# Patient Record
Sex: Female | Born: 1970 | Race: Black or African American | Hispanic: Yes | Marital: Married | Smoking: Never smoker
Health system: Southern US, Community
[De-identification: ages and names within clinical notes are randomized; demographics above are authoritative.]

## PROBLEM LIST (undated history)

## (undated) DIAGNOSIS — K219 Gastro-esophageal reflux disease without esophagitis: Secondary | ICD-10-CM

## (undated) DIAGNOSIS — K5909 Other constipation: Secondary | ICD-10-CM

## (undated) DIAGNOSIS — E079 Disorder of thyroid, unspecified: Secondary | ICD-10-CM

## (undated) DIAGNOSIS — I10 Essential (primary) hypertension: Secondary | ICD-10-CM

## (undated) HISTORY — PX: ABDOMINAL HYSTERECTOMY: SHX81

## (undated) HISTORY — DX: Essential (primary) hypertension: I10

## (undated) HISTORY — DX: Other constipation: K59.09

## (undated) HISTORY — DX: Gastro-esophageal reflux disease without esophagitis: K21.9

## (undated) HISTORY — DX: Disorder of thyroid, unspecified: E07.9

---

## 2011-02-08 ENCOUNTER — Other Ambulatory Visit: Payer: Self-pay | Admitting: Family Medicine

## 2011-02-08 DIAGNOSIS — Z1231 Encounter for screening mammogram for malignant neoplasm of breast: Secondary | ICD-10-CM

## 2011-02-28 ENCOUNTER — Ambulatory Visit
Admission: RE | Admit: 2011-02-28 | Discharge: 2011-02-28 | Disposition: A | Discharge status: Home / Self Care | Payer: Commercial Indemnity | Source: Ambulatory Visit | Attending: Family Medicine | Admitting: Family Medicine

## 2011-02-28 DIAGNOSIS — Z1231 Encounter for screening mammogram for malignant neoplasm of breast: Secondary | ICD-10-CM

## 2011-03-07 ENCOUNTER — Other Ambulatory Visit: Payer: Self-pay | Admitting: Family Medicine

## 2011-03-07 DIAGNOSIS — R928 Other abnormal and inconclusive findings on diagnostic imaging of breast: Secondary | ICD-10-CM

## 2011-03-16 ENCOUNTER — Ambulatory Visit
Admission: RE | Admit: 2011-03-16 | Discharge: 2011-03-16 | Disposition: A | Discharge status: Home / Self Care | Payer: Commercial Indemnity | Source: Ambulatory Visit | Attending: Family Medicine | Admitting: Family Medicine

## 2011-03-16 DIAGNOSIS — R928 Other abnormal and inconclusive findings on diagnostic imaging of breast: Secondary | ICD-10-CM

## 2012-03-07 ENCOUNTER — Other Ambulatory Visit (HOSPITAL_COMMUNITY): Payer: Self-pay | Admitting: Family Medicine

## 2012-03-07 DIAGNOSIS — R946 Abnormal results of thyroid function studies: Secondary | ICD-10-CM

## 2012-03-10 ENCOUNTER — Other Ambulatory Visit: Payer: Self-pay | Admitting: Family Medicine

## 2012-03-10 DIAGNOSIS — Z1231 Encounter for screening mammogram for malignant neoplasm of breast: Secondary | ICD-10-CM

## 2012-03-17 ENCOUNTER — Ambulatory Visit (HOSPITAL_COMMUNITY)
Admission: RE | Admit: 2012-03-17 | Discharge: 2012-03-17 | Disposition: A | Discharge status: Home / Self Care | Payer: BC Managed Care – PPO | Source: Ambulatory Visit | Attending: Family Medicine | Admitting: Family Medicine

## 2012-03-17 DIAGNOSIS — R946 Abnormal results of thyroid function studies: Secondary | ICD-10-CM | POA: Insufficient documentation

## 2012-03-18 ENCOUNTER — Other Ambulatory Visit (HOSPITAL_COMMUNITY): Payer: Self-pay | Admitting: Family Medicine

## 2012-03-18 ENCOUNTER — Encounter (HOSPITAL_COMMUNITY)
Admission: RE | Admit: 2012-03-18 | Discharge: 2012-03-18 | Disposition: A | Discharge status: Home / Self Care | Payer: BC Managed Care – PPO | Source: Ambulatory Visit | Attending: Family Medicine | Admitting: Family Medicine

## 2012-03-18 DIAGNOSIS — R946 Abnormal results of thyroid function studies: Secondary | ICD-10-CM

## 2012-03-18 MED ORDER — SODIUM PERTECHNETATE TC 99M INJECTION
10.0000 | Freq: Once | INTRAVENOUS | Status: AC | PRN
  Administered 2012-03-18: 10 via INTRAVENOUS

## 2012-03-18 MED ORDER — SODIUM IODIDE I 131 CAPSULE
12.5000 | Freq: Once | INTRAVENOUS | Status: AC | PRN
  Administered 2012-03-17: 12.5 via ORAL

## 2012-04-04 ENCOUNTER — Other Ambulatory Visit: Payer: Self-pay | Admitting: Endocrinology

## 2012-04-04 ENCOUNTER — Ambulatory Visit
Admission: RE | Admit: 2012-04-04 | Discharge: 2012-04-04 | Disposition: A | Discharge status: Home / Self Care | Payer: BC Managed Care – PPO | Source: Ambulatory Visit | Attending: Family Medicine | Admitting: Family Medicine

## 2012-04-04 DIAGNOSIS — E0590 Thyrotoxicosis, unspecified without thyrotoxic crisis or storm: Secondary | ICD-10-CM

## 2012-04-04 DIAGNOSIS — Z1231 Encounter for screening mammogram for malignant neoplasm of breast: Secondary | ICD-10-CM

## 2012-05-02 ENCOUNTER — Encounter (HOSPITAL_COMMUNITY)
Admission: RE | Admit: 2012-05-02 | Discharge: 2012-05-02 | Disposition: A | Discharge status: Home / Self Care | Payer: BC Managed Care – PPO | Source: Ambulatory Visit | Attending: Endocrinology | Admitting: Endocrinology

## 2012-05-02 DIAGNOSIS — E0590 Thyrotoxicosis, unspecified without thyrotoxic crisis or storm: Secondary | ICD-10-CM | POA: Insufficient documentation

## 2012-05-02 MED ORDER — SODIUM IODIDE I 131 CAPSULE
16.7500 | Freq: Once | INTRAVENOUS | Status: AC | PRN
  Administered 2012-05-02: 16.75 via ORAL

## 2013-01-17 ENCOUNTER — Ambulatory Visit (INDEPENDENT_AMBULATORY_CARE_PROVIDER_SITE_OTHER): Payer: BC Managed Care – PPO | Admitting: Family Medicine

## 2013-01-17 VITALS — BP 147/97 | HR 59 | Temp 98.0°F | Resp 16 | Ht 66.5 in | Wt 230.0 lb

## 2013-01-17 DIAGNOSIS — R42 Dizziness and giddiness: Secondary | ICD-10-CM

## 2013-01-17 DIAGNOSIS — D649 Anemia, unspecified: Secondary | ICD-10-CM

## 2013-01-17 DIAGNOSIS — I10 Essential (primary) hypertension: Secondary | ICD-10-CM

## 2013-01-17 DIAGNOSIS — E079 Disorder of thyroid, unspecified: Secondary | ICD-10-CM

## 2013-01-17 LAB — POCT URINALYSIS DIPSTICK
Bilirubin, UA: NEGATIVE
Blood, UA: NEGATIVE
Glucose, UA: NEGATIVE
Ketones, UA: NEGATIVE
Leukocytes, UA: NEGATIVE
Nitrite, UA: NEGATIVE
Protein, UA: NEGATIVE
Spec Grav, UA: 1.02
Urobilinogen, UA: 1
pH, UA: 6.5

## 2013-01-17 LAB — POCT CBC
Granulocyte percent: 54.2 % (ref 37–80)
HCT, POC: 37.8 % (ref 37.7–47.9)
Hemoglobin: 11.7 g/dL — AB (ref 12.2–16.2)
Lymph, poc: 1.9 (ref 0.6–3.4)
MCH, POC: 28.9 pg (ref 27–31.2)
MCHC: 31 g/dL — AB (ref 31.8–35.4)
MCV: 93.4 fL (ref 80–97)
MID (cbc): 0.3 (ref 0–0.9)
MPV: 9.5 fL (ref 0–99.8)
POC Granulocyte: 2.7 (ref 2–6.9)
POC LYMPH PERCENT: 38.9 % (ref 10–50)
POC MID %: 6.9 % (ref 0–12)
Platelet Count, POC: 252 10*3/uL (ref 142–424)
RBC: 4.05 M/uL (ref 4.04–5.48)
RDW, POC: 14.6 %
WBC: 5 10*3/uL (ref 4.6–10.2)

## 2013-01-17 LAB — COMPREHENSIVE METABOLIC PANEL WITH GFR
ALT: 8 U/L (ref 0–35)
AST: 17 U/L (ref 0–37)
Albumin: 4.5 g/dL (ref 3.5–5.2)
Alkaline Phosphatase: 86 U/L (ref 39–117)
Potassium: 4.3 meq/L (ref 3.5–5.3)
Sodium: 139 meq/L (ref 135–145)
Total Bilirubin: 0.6 mg/dL (ref 0.3–1.2)
Total Protein: 7.7 g/dL (ref 6.0–8.3)

## 2013-01-17 LAB — COMPREHENSIVE METABOLIC PANEL
BUN: 13 mg/dL (ref 6–23)
CO2: 24 mEq/L (ref 19–32)
Calcium: 9.4 mg/dL (ref 8.4–10.5)
Chloride: 104 mEq/L (ref 96–112)
Creat: 1.34 mg/dL — ABNORMAL HIGH (ref 0.50–1.10)
Glucose, Bld: 98 mg/dL (ref 70–99)

## 2013-01-17 LAB — POCT UA - MICROSCOPIC ONLY
Casts, Ur, LPF, POC: NEGATIVE
Crystals, Ur, HPF, POC: NEGATIVE
Mucus, UA: POSITIVE
Yeast, UA: NEGATIVE

## 2013-01-17 LAB — TSH: TSH: 71.724 u[IU]/mL — ABNORMAL HIGH (ref 0.350–4.500)

## 2013-01-17 MED ORDER — CHLORTHALIDONE 25 MG PO TABS: 25.0000 mg | ORAL_TABLET | Freq: Every day | ORAL | Status: DC

## 2013-01-17 NOTE — Progress Notes (Signed)
 [facility]:  Office Visit  Chief Complaint:  Chief Complaint  Patient presents with  . Hypertension    158/107 yesterday    HPI: Danielle Khan is a 42 y.o. female who complains of  HTN x 10 years, but yesterday she had a dizzy spell when she was trying to get up from a sitting to standing position,  and had seen the nurse at work and had BP of 158/107 Was on fluid pill but was taken off because she had low potassium, she was doing ok with BP when she was on the  Hctz 25 mg  She also has hyperthyroid disease s/p radiation. Followed by Dr. Lucianne Muss. Per patient she was told that after her "radiation pill" she was fine and did not need follow-up.   PCP: Milus Height ( last saw her 1 year ago) Endocrinologist: Reather [facility])  Past Medical History  Diagnosis Date  . Hypertension   . Thyroid disease    Past Surgical History  Procedure Laterality Date  . Abdominal hysterectomy     History   Social History  . Marital Status: Married    Spouse Name: N/A    Number of Children: N/A  . Years of Education: N/A   Social History Main Topics  . Smoking status: Never Smoker   . Smokeless tobacco: None  . Alcohol Use: Yes  . Drug Use: No  . Sexually Active: Yes   Other Topics Concern  . None   Social History Narrative  . None   History reviewed. No pertinent family history. No Known Allergies Prior to Admission medications   Medication Sig Start Date End Date Taking? Authorizing Provider  atenolol (TENORMIN) 50 MG tablet Take 50 mg by mouth daily.   Yes Historical Provider, MD     ROS: The patient denies fevers, chills, night sweats, unintentional weight loss, chest pain, palpitations, wheezing, dyspnea on exertion, nausea, vomiting, abdominal pain, dysuria, hematuria, melena, numbness, weakness, or tingling.   All other systems have been reviewed and were otherwise negative with the exception of those mentioned in the HPI and as above.     PHYSICAL EXAM: Filed Vitals:   01/17/13 1217  BP: 147/97  Pulse: 59  Temp:   Resp:    Filed Vitals:   01/17/13 1131  Height: 5' 6.5" (1.689 m)  Weight: 230 lb (104.327 kg)   Body mass index is 36.57 kg/(m^2).  General: Alert, no acute distress HEENT:  Normocephalic, atraumatic, oropharynx patent. + exopthalamos Cardiovascular:  Regular rate and rhythm, no rubs murmurs or gallops.  No Carotid bruits, radial pulse intact. No pedal edema.  Respiratory: Clear to auscultation bilaterally.  No wheezes, rales, or rhonchi.  No cyanosis, no use of accessory musculature GI: No organomegaly, abdomen is soft and non-tender, positive bowel sounds.  No masses. Skin: No rashes. Neurologic: Facial musculature symmetric. Psychiatric: Patient is appropriate throughout our interaction. Lymphatic: No cervical lymphadenopathy Musculoskeletal: Gait intact.   LABS: Results for orders placed in visit on 01/17/13  POCT CBC      Result Value Range   WBC 5.0  4.6 - 10.2 K/uL   Lymph, poc 1.9  0.6 - 3.4   POC LYMPH PERCENT 38.9  10 - 50 %L   MID (cbc) 0.3  0 - 0.9   POC MID % 6.9  0 - 12 %M   POC Granulocyte 2.7  2 - 6.9   Granulocyte percent 54.2  37 - 80 %G  RBC 4.05  4.04 - 5.48 M/uL   Hemoglobin 11.7 (*) 12.2 - 16.2 g/dL   HCT, POC 81.1  91.4 - 47.9 %   MCV 93.4  80 - 97 fL   MCH, POC 28.9  27 - 31.2 pg   MCHC 31.0 (*) 31.8 - 35.4 g/dL   RDW, POC 78.2     Platelet Count, POC 252  142 - 424 K/uL   MPV 9.5  0 - 99.8 fL  POCT UA - MICROSCOPIC ONLY      Result Value Range   WBC, Ur, HPF, POC 7-8     RBC, urine, microscopic 1-2     Bacteria, U Microscopic 2+     Mucus, UA pos     Epithelial cells, urine per micros 5-6     Crystals, Ur, HPF, POC neg     Casts, Ur, LPF, POC neg     Yeast, UA neg    POCT URINALYSIS DIPSTICK      Result Value Range   Color, UA yellow     Clarity, UA cloudy     Glucose, UA negative     Bilirubin, UA negative     Ketones, UA negative     Spec Grav,  UA 1.020     Blood, UA negative     pH, UA 6.5     Protein, UA negative     Urobilinogen, UA 1.0     Nitrite, UA negative     Leukocytes, UA Negative       EKG/XRAY:   Primary read interpreted by Dr. Conley Rolls at [facility].   ASSESSMENT/PLAN: Encounter Diagnoses  Name Primary?  . HTN (hypertension) Yes  . Dizziness   . Thyroid disease    Although rthostatics normal, most likely positional  She would like to go back to work if BP is better controlled. I will see her tomorrow after HCTZ has been added on She wil return for annual to get IFOBT Labs pending: CMP and also TSH I have advised her to see Dr. Lucianne Muss for thyroid f/u F/u in 1 day for HTN  ,  PHUONG, DO 01/17/2013 12:52 PM

## 2013-01-18 ENCOUNTER — Ambulatory Visit (INDEPENDENT_AMBULATORY_CARE_PROVIDER_SITE_OTHER): Payer: BC Managed Care – PPO | Admitting: Family Medicine

## 2013-01-18 VITALS — BP 120/88 | HR 61 | Temp 97.9°F | Resp 16 | Ht 66.25 in | Wt 229.0 lb

## 2013-01-18 DIAGNOSIS — I10 Essential (primary) hypertension: Secondary | ICD-10-CM

## 2013-01-18 DIAGNOSIS — D649 Anemia, unspecified: Secondary | ICD-10-CM

## 2013-01-18 DIAGNOSIS — E039 Hypothyroidism, unspecified: Secondary | ICD-10-CM

## 2013-01-18 MED ORDER — LEVOTHYROXINE SODIUM 50 MCG PO TABS: 50.0000 ug | ORAL_TABLET | Freq: Every day | ORAL | Status: DC

## 2013-01-18 NOTE — Progress Notes (Signed)
[facility]:  Office Visit  Chief Complaint:  Chief Complaint  Patient presents with  . Hypertension    follow up    HPI: Danielle Khan is a 42 y.o. female who complains of  Her for BP recheck. She is doing better, she is urinating a little more but denies CP, dizziness, HAs.  She has had hyperthyroid s/p radiation and now her labs show that she is hypothyroid. Dr. Maeola Sarah is her endocrinologist  Please see HPI from yesterday Danielle Khan is a 42 y.o. female who complains of HTN x 10 years, but yesterday she had a dizzy spell when she was trying to get up and had seen the nurse at work and had BP of 158/107  Was on fluid pill but was taken off because she had low potassium, she was doing ok with Hctz 25 mg.  She also has hyperthyroid disease s/p radiation a few months back PCP: Altamease Oiler Redmon ( last saw her 1 year ago)  Endocrinologist: Reather [facility])    Past Medical History  Diagnosis Date  . Hypertension   . Thyroid disease    Past Surgical History  Procedure Laterality Date  . Abdominal hysterectomy     History   Social History  . Marital Status: Married    Spouse Name: N/A    Number of Children: N/A  . Years of Education: N/A   Social History Main Topics  . Smoking status: Never Smoker   . Smokeless tobacco: None  . Alcohol Use: Yes  . Drug Use: No  . Sexually Active: Yes   Other Topics Concern  . None   Social History Narrative  . None   History reviewed. No pertinent family history. No Known Allergies Prior to Admission medications   Medication Sig Start Date End Date Taking? Authorizing Provider  atenolol (TENORMIN) 50 MG tablet Take 50 mg by mouth daily.   Yes Historical Provider, MD  chlorthalidone (HYGROTON) 25 MG tablet Take 1 tablet (25 mg total) by mouth daily. 01/17/13  Yes Thao P Le, DO     ROS: The patient denies fevers, chills, night sweats, unintentional weight loss, chest pain, palpitations,  wheezing, dyspnea on exertion, nausea, vomiting, abdominal pain, dysuria, hematuria, melena, numbness, weakness, or tingling.   All other systems have been reviewed and were otherwise negative with the exception of those mentioned in the HPI and as above.    PHYSICAL EXAM: Filed Vitals:   01/18/13 1121  BP: 120/88  Pulse: 61  Temp: 97.9 F (36.6 C)  Resp: 16   Filed Vitals:   01/18/13 1121  Height: 5' 6.25" (1.683 m)  Weight: 229 lb (103.874 kg)   Body mass index is 36.67 kg/(m^2).  General: Alert, no acute distress HEENT:  Normocephalic, atraumatic, oropharynx patent. Exopthalamos, no appreciable thyroid megaly Cardiovascular:  Regular rate and rhythm, no rubs murmurs or gallops.  No Carotid bruits, radial pulse intact. No pedal edema.  Respiratory: Clear to auscultation bilaterally.  No wheezes, rales, or rhonchi.  No cyanosis, no use of accessory musculature GI: No organomegaly, abdomen is soft and non-tender, positive bowel sounds.  No masses. Skin: No rashes. Neurologic: Facial musculature symmetric. Psychiatric: Patient is appropriate throughout our interaction. Lymphatic: No cervical lymphadenopathy Musculoskeletal: Gait intact.   LABS: Results for orders placed in visit on 01/17/13  COMPREHENSIVE METABOLIC PANEL      Result Value Range   Sodium 139  135 - 145 mEq/L   Potassium 4.3  3.5 - 5.3 mEq/L   Chloride 104  96 - 112 mEq/L   CO2 24  19 - 32 mEq/L   Glucose, Bld 98  70 - 99 mg/dL   BUN 13  6 - 23 mg/dL   Creat 1.61 (*) 0.96 - 1.10 mg/dL   Total Bilirubin 0.6  0.3 - 1.2 mg/dL   Alkaline Phosphatase 86  39 - 117 U/L   AST 17  0 - 37 U/L   ALT 8  0 - 35 U/L   Total Protein 7.7  6.0 - 8.3 g/dL   Albumin 4.5  3.5 - 5.2 g/dL   Calcium 9.4  8.4 - 04.5 mg/dL  TSH      Result Value Range   TSH 71.724 (*) 0.350 - 4.500 uIU/mL  POCT CBC      Result Value Range   WBC 5.0  4.6 - 10.2 K/uL   Lymph, poc 1.9  0.6 - 3.4   POC LYMPH PERCENT 38.9  10 - 50 %L   MID  (cbc) 0.3  0 - 0.9   POC MID % 6.9  0 - 12 %M   POC Granulocyte 2.7  2 - 6.9   Granulocyte percent 54.2  37 - 80 %G   RBC 4.05  4.04 - 5.48 M/uL   Hemoglobin 11.7 (*) 12.2 - 16.2 g/dL   HCT, POC 40.9  81.1 - 47.9 %   MCV 93.4  80 - 97 fL   MCH, POC 28.9  27 - 31.2 pg   MCHC 31.0 (*) 31.8 - 35.4 g/dL   RDW, POC 91.4     Platelet Count, POC 252  142 - 424 K/uL   MPV 9.5  0 - 99.8 fL  POCT UA - MICROSCOPIC ONLY      Result Value Range   WBC, Ur, HPF, POC 7-8     RBC, urine, microscopic 1-2     Bacteria, U Microscopic 2+     Mucus, UA pos     Epithelial cells, urine per micros 5-6     Crystals, Ur, HPF, POC neg     Casts, Ur, LPF, POC neg     Yeast, UA neg    POCT URINALYSIS DIPSTICK      Result Value Range   Color, UA yellow     Clarity, UA cloudy     Glucose, UA negative     Bilirubin, UA negative     Ketones, UA negative     Spec Grav, UA 1.020     Blood, UA negative     pH, UA 6.5     Protein, UA negative     Urobilinogen, UA 1.0     Nitrite, UA negative     Leukocytes, UA Negative       EKG/XRAY:   Primary read interpreted by Dr. Conley Rolls at [facility].   ASSESSMENT/PLAN: Encounter Diagnoses  Name Primary?  . HTN (hypertension) Yes  . Unspecified hypothyroidism   . Anemia    HTN is improved, she has no SEs-return in 1 month to recheck and get repeat BMP She will take a banana a day nad potassium rich foods ie coconut water Advise to stop chewing tobacco Hypothyroid s/p radiation, she will be started on Levothyroxine 50 mcg and see how she does, she will return to Dr. Lucianne Muss for follow-up in 6 weeks, advise to make appt Instructed the patient on how to take Levothyroxine, by itself with water, no other meds, empty stomach, wait 30  min-1 hr before eating.drinking She will return in 1 month for annual visit we can recheck her HTN, anemia at that time. Declined rectal exam today.  Gross sideeffects, risk and benefits, and alternatives of medications d/w patient. Patient is  aware that all medications have potential sideeffects and we are unable to predict every sideeffect or drug-drug interaction that may occur. I will call DR. Lucianne Muss to see if anything elese needs to be done, and also if LEvothyroxine 50 mcg is appropriate with TSH in the 71 range.  F/u in 1 month   LE, THAO PHUONG, DO 01/18/2013 11:34 AM

## 2013-01-26 ENCOUNTER — Telehealth: Payer: Self-pay | Admitting: Family Medicine

## 2013-01-26 NOTE — Telephone Encounter (Signed)
Spoke wtih patient--informed her I spoke with Dr. Remus Blake nurse about TSH and strating levothyroxine. She will le thim know Advise patient to make appt with him sinc estarting new meds Also return for annual, HTN f/u, anemia work up if still the case in 3-4 weeks

## 2013-02-01 DIAGNOSIS — Z0271 Encounter for disability determination: Secondary | ICD-10-CM

## 2013-02-26 ENCOUNTER — Other Ambulatory Visit: Payer: Self-pay | Admitting: Family Medicine

## 2013-02-26 NOTE — Telephone Encounter (Signed)
Sent refill of Chlorthalidone for one month included note to advise pt to rtc for ov. Tried to reach patient to advise she will need a visit for further refills per last OV. Unable to leave a message.

## 2013-03-24 ENCOUNTER — Other Ambulatory Visit: Payer: Self-pay

## 2013-03-24 DIAGNOSIS — Z1231 Encounter for screening mammogram for malignant neoplasm of breast: Secondary | ICD-10-CM

## 2013-04-08 ENCOUNTER — Ambulatory Visit: Payer: BC Managed Care – PPO

## 2013-05-08 ENCOUNTER — Encounter: Payer: Self-pay | Admitting: Family Medicine

## 2013-05-30 ENCOUNTER — Other Ambulatory Visit: Payer: Self-pay | Admitting: Family Medicine

## 2013-07-07 ENCOUNTER — Ambulatory Visit: Payer: BC Managed Care – PPO

## 2013-08-10 ENCOUNTER — Telehealth: Payer: Self-pay

## 2013-08-10 NOTE — Telephone Encounter (Signed)
PT STATES SHE NEED TO HAVE HER FMLA PAPERS UPDATED. PLEASE CALL [phone] AND WILL BE IN ONE DAY NEXT WEEK TO SEE DR LE

## 2013-08-10 NOTE — Telephone Encounter (Signed)
Spoke with patient, she plans to RTC on 08/14/13 . Would like to know if we can call in a few blood pressure pills until that date. Please advise

## 2013-08-11 MED ORDER — ATENOLOL 50 MG PO TABS: 50.0000 mg | ORAL_TABLET | Freq: Every day | ORAL | Status: DC

## 2013-08-11 NOTE — Telephone Encounter (Signed)
Called pt and verified she has appt w/Dr Conley RollsLe on Friday and that it is the atenolol she needs. Sent in 1 week RF.

## 2013-08-15 ENCOUNTER — Ambulatory Visit (INDEPENDENT_AMBULATORY_CARE_PROVIDER_SITE_OTHER): Payer: BC Managed Care – PPO | Admitting: Family Medicine

## 2013-08-15 VITALS — BP 138/94 | HR 71 | Temp 98.1°F | Resp 16 | Ht 66.5 in | Wt 231.0 lb

## 2013-08-15 DIAGNOSIS — I10 Essential (primary) hypertension: Secondary | ICD-10-CM

## 2013-08-15 DIAGNOSIS — Z91148 Patient's other noncompliance with medication regimen for other reason: Secondary | ICD-10-CM

## 2013-08-15 DIAGNOSIS — Z9114 Patient's other noncompliance with medication regimen: Secondary | ICD-10-CM

## 2013-08-15 DIAGNOSIS — E039 Hypothyroidism, unspecified: Secondary | ICD-10-CM

## 2013-08-15 DIAGNOSIS — D649 Anemia, unspecified: Secondary | ICD-10-CM

## 2013-08-15 DIAGNOSIS — Z0271 Encounter for disability determination: Secondary | ICD-10-CM

## 2013-08-15 LAB — POCT CBC
Granulocyte percent: 54.6 % (ref 37–80)
HCT, POC: 36.8 % — AB (ref 37.7–47.9)
Hemoglobin: 11.2 g/dL — AB (ref 12.2–16.2)
Lymph, poc: 2.2 (ref 0.6–3.4)
MCH, POC: 28.9 pg (ref 27–31.2)
MCHC: 30.4 g/dL — AB (ref 31.8–35.4)
MCV: 95.2 fL (ref 80–97)
MID (cbc): 0.3 (ref 0–0.9)
MPV: 8.2 fL (ref 0–99.8)
POC Granulocyte: 3 (ref 2–6.9)
POC LYMPH PERCENT: 39.8 % (ref 10–50)
POC MID %: 5.6 %M (ref 0–12)
Platelet Count, POC: 268 10*3/uL (ref 142–424)
RBC: 3.87 M/uL — AB (ref 4.04–5.48)
RDW, POC: 14.3 %
WBC: 5.5 10*3/uL (ref 4.6–10.2)

## 2013-08-15 MED ORDER — CHLORTHALIDONE 25 MG PO TABS: 25.0000 mg | ORAL_TABLET | Freq: Every day | ORAL | Status: DC

## 2013-08-15 MED ORDER — ATENOLOL 50 MG PO TABS: 50.0000 mg | ORAL_TABLET | Freq: Every day | ORAL | Status: DC

## 2013-08-15 MED ORDER — LEVOTHYROXINE SODIUM 50 MCG PO TABS: 50.0000 ug | ORAL_TABLET | Freq: Every day | ORAL | Status: DC

## 2013-08-15 NOTE — Progress Notes (Signed)
Chief Complaint:  Chief Complaint  Patient presents with  . Hypertension    med refill  . Hypothyroidism    med refill    HPI: Danielle MccallumCynthia Khan is a 43 y.o. female who is here for : 1. HTN-she is doing well. BP is doing well. She has 7 pills, ran out 4 days but then got 7 pills as emerency meds.  2. Hypothyroidsim s/p radiation-she has not been taking her thyroid meds correctly, she takes about 3 pills a wekk because she forgets and eats ahead of time. She is slightly more moodiness. She has been stress. She works at [employer] [place] , she works second shift. She is not having chest palpitations, insomnia, She has been stressed out due to taking her daughter who had to have her cholecystectomy. Dr Lucianne MussKumar March 2 but she may have to reschedule.   Past Medical History  Diagnosis Date  . Hypertension   . Thyroid disease    Past Surgical History  Procedure Laterality Date  . Abdominal hysterectomy     History   Social History  . Marital Status: Married    Spouse Name: N/A    Number of Children: N/A  . Years of Education: N/A   Social History Main Topics  . Smoking status: Never Smoker   . Smokeless tobacco: None  . Alcohol Use: Yes  . Drug Use: No  . Sexual Activity: Yes   Other Topics Concern  . None   Social History Narrative  . None   No family history on file. No Known Allergies Prior to Admission medications   Medication Sig Start Date End Date Taking? Authorizing Provider  atenolol (TENORMIN) 50 MG tablet Take 1 tablet (50 mg total) by mouth daily. 08/11/13  Yes Siddh Vandeventer P Laurin Morgenstern, DO  levothyroxine (SYNTHROID, LEVOTHROID) 50 MCG tablet Take 1 tablet (50 mcg total) by mouth daily before breakfast. PATIENT NEEDS OFFICE VISIT/LABSFOR ADDITIONAL REFILLS! 05/30/13  Yes Broden Holt P Toren Tucholski, DO  chlorthalidone (HYGROTON) 25 MG tablet Take 1 tablet (25 mg total) by mouth daily. Take 1 tablet daily. Patient needs follow up for further refills. 02/26/13   Heather Jaquita RectorM Marte, PA-C      ROS: The patient denies fevers, chills, night sweats, unintentional weight loss, chest pain, palpitations, wheezing, dyspnea on exertion, nausea, vomiting, abdominal pain, dysuria, hematuria, melena, numbness, weakness, or tingling.   All other systems have been reviewed and were otherwise negative with the exception of those mentioned in the HPI and as above.    PHYSICAL EXAM: Filed Vitals:   08/15/13 1616  BP: 138/94  Pulse: 71  Temp: 98.1 F (36.7 C)  Resp: 16   Filed Vitals:   08/15/13 1616  Height: 5' 6.5" (1.689 m)  Weight: 231 lb (104.781 kg)   Body mass index is 36.73 kg/(m^2).  General: Alert, no acute distress HEENT:  Normocephalic, atraumatic, oropharynx patent. EOMI, PERRLA, fundoscopic exam nl Cardiovascular:  Regular rate and rhythm, no rubs murmurs or gallops.  No Carotid bruits, radial pulse intact. No pedal edema.  Respiratory: Clear to auscultation bilaterally.  No wheezes, rales, or rhonchi.  No cyanosis, no use of accessory musculature GI: No organomegaly, abdomen is soft and non-tender, positive bowel sounds.  No masses. Skin: No rashes. Neurologic: Facial musculature symmetric. Psychiatric: Patient is appropriate throughout our interaction. Lymphatic: No cervical lymphadenopathy Musculoskeletal: Gait intact.   LABS: Results for orders placed in visit on 08/15/13  POCT CBC      Result Value  Ref Range   WBC 5.5  4.6 - 10.2 K/uL   Lymph, poc 2.2  0.6 - 3.4   POC LYMPH PERCENT 39.8  10 - 50 %L   MID (cbc) 0.3  0 - 0.9   POC MID % 5.6  0 - 12 %M   POC Granulocyte 3.0  2 - 6.9   Granulocyte percent 54.6  37 - 80 %G   RBC 3.87 (*) 4.04 - 5.48 M/uL   Hemoglobin 11.2 (*) 12.2 - 16.2 g/dL   HCT, POC 16.1 (*) 09.6 - 47.9 %   MCV 95.2  80 - 97 fL   MCH, POC 28.9  27 - 31.2 pg   MCHC 30.4 (*) 31.8 - 35.4 g/dL   RDW, POC 04.5     Platelet Count, POC 268  142 - 424 K/uL   MPV 8.2  0 - 99.8 fL     EKG/XRAY:   Primary read interpreted by [facility] Conley Rolls at  [facility].   ASSESSMENT/PLAN: Encounter Diagnoses  Name Primary?  . Hypothyroid Yes  . Hypertension   . Anemia    43 year old female who is noncompliant with her medications. She is doing better with blood pressure control but not with thyroid medicine. She takes the thyroid meds about 3 times a week.  She has an appt with her endocrinologist that was rescheduled due to the snow, has not gotten a callback when that will be in March yet. Advise to cont to take BP meds and thryoid meds Labs pending---I am not sure she is going to keep appt so will go ahead and do TSH labs as well as CMP Refilled meds Follow-up in 6 months for BP check F/u with endo for thyroid  F/u at 104 for annual visit and fasting labs, anemia f/u in 1 month  Gross sideeffects, risk and benefits, and alternatives of medications d/w patient. Patient is aware that all medications have potential sideeffects and we are unable to predict every sideeffect or drug-drug interaction that may occur.  Rockne Coons, DO 08/15/2013 5:33 PM

## 2013-08-16 LAB — COMPREHENSIVE METABOLIC PANEL WITH GFR
ALT: 9 U/L (ref 0–35)
AST: 16 U/L (ref 0–37)
Albumin: 4.5 g/dL (ref 3.5–5.2)
Alkaline Phosphatase: 88 U/L (ref 39–117)
BUN: 13 mg/dL (ref 6–23)
Calcium: 8.6 mg/dL (ref 8.4–10.5)
Chloride: 102 meq/L (ref 96–112)
Potassium: 3.8 meq/L (ref 3.5–5.3)
Sodium: 138 meq/L (ref 135–145)
Total Protein: 7.8 g/dL (ref 6.0–8.3)

## 2013-08-16 LAB — TSH: TSH: 79.144 u[IU]/mL — ABNORMAL HIGH (ref 0.350–4.500)

## 2013-08-16 LAB — COMPREHENSIVE METABOLIC PANEL
CO2: 29 mEq/L (ref 19–32)
Creat: 1.18 mg/dL — ABNORMAL HIGH (ref 0.50–1.10)
Glucose, Bld: 85 mg/dL (ref 70–99)
Total Bilirubin: 0.7 mg/dL (ref 0.2–1.2)

## 2013-08-16 LAB — MICROALBUMIN, URINE: Microalb, Ur: 1.56 mg/dL (ref 0.00–1.89)

## 2013-08-17 ENCOUNTER — Ambulatory Visit: Payer: BC Managed Care – PPO | Admitting: Endocrinology

## 2013-08-19 ENCOUNTER — Encounter: Payer: Self-pay | Admitting: Family Medicine

## 2013-08-19 ENCOUNTER — Telehealth: Payer: Self-pay

## 2013-08-19 ENCOUNTER — Telehealth: Payer: Self-pay | Admitting: Family Medicine

## 2013-08-19 DIAGNOSIS — Z91148 Patient's other noncompliance with medication regimen for other reason: Secondary | ICD-10-CM | POA: Insufficient documentation

## 2013-08-19 DIAGNOSIS — E1159 Type 2 diabetes mellitus with other circulatory complications: Secondary | ICD-10-CM | POA: Insufficient documentation

## 2013-08-19 DIAGNOSIS — E890 Postprocedural hypothyroidism: Secondary | ICD-10-CM | POA: Insufficient documentation

## 2013-08-19 DIAGNOSIS — I10 Essential (primary) hypertension: Secondary | ICD-10-CM | POA: Insufficient documentation

## 2013-08-19 DIAGNOSIS — Z9114 Patient's other noncompliance with medication regimen: Secondary | ICD-10-CM | POA: Insufficient documentation

## 2013-08-19 DIAGNOSIS — I152 Hypertension secondary to endocrine disorders: Secondary | ICD-10-CM | POA: Insufficient documentation

## 2013-08-19 NOTE — Telephone Encounter (Signed)
Spoke to patient about labs, since she was only taking her thyroid medicine 3 days out of the week, I encouraged her to take the medicine daily . She is now taking it daily on an empty stomach 30 min before food or other meds. D/w her need for annual visit she can call and make an appt. Needs to be fasting labs. She has endo appt on march 24. D/w patient recent labwork.

## 2013-08-19 NOTE — Telephone Encounter (Signed)
PTS FMLA PPW IS COMPLETED AND READY FOR P/U, LMOM LETTING PT KNOW THIS.

## 2013-09-04 ENCOUNTER — Ambulatory Visit (INDEPENDENT_AMBULATORY_CARE_PROVIDER_SITE_OTHER): Payer: BC Managed Care – PPO | Admitting: Family Medicine

## 2013-09-04 VITALS — BP 118/82 | HR 73 | Temp 98.3°F | Resp 16 | Ht 65.5 in | Wt 224.6 lb

## 2013-09-04 DIAGNOSIS — Z862 Personal history of diseases of the blood and blood-forming organs and certain disorders involving the immune mechanism: Secondary | ICD-10-CM

## 2013-09-04 DIAGNOSIS — K5900 Constipation, unspecified: Secondary | ICD-10-CM

## 2013-09-04 DIAGNOSIS — E039 Hypothyroidism, unspecified: Secondary | ICD-10-CM

## 2013-09-04 DIAGNOSIS — Z8742 Personal history of other diseases of the female genital tract: Secondary | ICD-10-CM

## 2013-09-04 DIAGNOSIS — Z0000 Encounter for general adult medical examination without abnormal findings: Secondary | ICD-10-CM

## 2013-09-04 DIAGNOSIS — I10 Essential (primary) hypertension: Secondary | ICD-10-CM

## 2013-09-04 DIAGNOSIS — Z8639 Personal history of other endocrine, nutritional and metabolic disease: Secondary | ICD-10-CM

## 2013-09-04 LAB — CBC
HCT: 33.7 % — ABNORMAL LOW (ref 36.0–46.0)
Hemoglobin: 11.1 g/dL — ABNORMAL LOW (ref 12.0–15.0)
MCH: 29.4 pg (ref 26.0–34.0)
MCHC: 32.9 g/dL (ref 30.0–36.0)
MCV: 89.2 fL (ref 78.0–100.0)
Platelets: 245 10*3/uL (ref 150–400)
RBC: 3.78 MIL/uL — ABNORMAL LOW (ref 3.87–5.11)
RDW: 15 % (ref 11.5–15.5)
WBC: 5.5 10*3/uL (ref 4.0–10.5)

## 2013-09-04 LAB — COMPREHENSIVE METABOLIC PANEL
ALT: 8 U/L (ref 0–35)
BUN: 12 mg/dL (ref 6–23)
CO2: 30 mEq/L (ref 19–32)
Creat: 1.12 mg/dL — ABNORMAL HIGH (ref 0.50–1.10)
Total Bilirubin: 0.5 mg/dL (ref 0.2–1.2)

## 2013-09-04 LAB — IFOBT (OCCULT BLOOD): IFOBT: POSITIVE

## 2013-09-04 LAB — POCT URINALYSIS DIPSTICK
Bilirubin, UA: NEGATIVE
Blood, UA: NEGATIVE
Glucose, UA: NEGATIVE
Ketones, UA: NEGATIVE
Leukocytes, UA: NEGATIVE
Nitrite, UA: NEGATIVE
Protein, UA: NEGATIVE
Spec Grav, UA: 1.02
Urobilinogen, UA: 1
pH, UA: 6.5

## 2013-09-04 LAB — COMPREHENSIVE METABOLIC PANEL WITH GFR
AST: 16 U/L (ref 0–37)
Albumin: 4.3 g/dL (ref 3.5–5.2)
Alkaline Phosphatase: 103 U/L (ref 39–117)
Calcium: 8.7 mg/dL (ref 8.4–10.5)
Chloride: 101 meq/L (ref 96–112)
Glucose, Bld: 98 mg/dL (ref 70–99)
Potassium: 3.4 meq/L — ABNORMAL LOW (ref 3.5–5.3)
Sodium: 138 meq/L (ref 135–145)
Total Protein: 7.4 g/dL (ref 6.0–8.3)

## 2013-09-04 LAB — POCT UA - MICROSCOPIC ONLY
Casts, Ur, LPF, POC: NEGATIVE
Crystals, Ur, HPF, POC: NEGATIVE
Mucus, UA: NEGATIVE
RBC, urine, microscopic: NEGATIVE
Yeast, UA: NEGATIVE

## 2013-09-04 LAB — LIPID PANEL
Cholesterol: 204 mg/dL — ABNORMAL HIGH (ref 0–200)
HDL: 43 mg/dL (ref >39)
LDL Cholesterol: 133 mg/dL — ABNORMAL HIGH (ref 0–99)
Total CHOL/HDL Ratio: 4.7 ratio
Triglycerides: 142 mg/dL (ref <150)
VLDL: 28 mg/dL (ref 0–40)

## 2013-09-04 NOTE — Telephone Encounter (Signed)
Faxed signed authorization to [facility] for medical records. Dr Ardean LarsenAskary has retired and the office was closed.

## 2013-09-04 NOTE — Progress Notes (Signed)
Chief Complaint: No chief complaint on file.   HPI: Danielle Khan is a 43 y.o. female who is here for  Annual exam Last pap 3 years ago. She had a hysterectomy 3 years ago due to ? Abnormal cells so they went ahead and took it out. She does not remember the exact details.  Mammogram is not , She does not do self breast exams She has some blood on tissue when she strains, she scratches, she also has taken fiber but not enough.  G4L4 She has been taking her meds regular for thyroid and , Doing better overall   Past Medical History  Diagnosis Date  . Hypertension   . Thyroid disease    Past Surgical History  Procedure Laterality Date  . Abdominal hysterectomy     History   Social History  . Marital Status: Married    Spouse Name: N/A    Number of Children: N/A  . Years of Education: N/A   Social History Main Topics  . Smoking status: Never Smoker   . Smokeless tobacco: Not on file  . Alcohol Use: Yes  . Drug Use: No  . Sexual Activity: Yes   Other Topics Concern  . Not on file   Social History Narrative  . No narrative on file   No family history on file. No Known Allergies Prior to Admission medications   Medication Sig Start Date End Date Taking? Authorizing Provider  atenolol (TENORMIN) 50 MG tablet Take 1 tablet (50 mg total) by mouth daily. 08/15/13  Yes Hommer Cunliffe P Martese Vanatta, DO  chlorthalidone (HYGROTON) 25 MG tablet Take 1 tablet (25 mg total) by mouth daily. 08/15/13  Yes Malachy Coleman P Richa Shor, DO  levothyroxine (SYNTHROID, LEVOTHROID) 50 MCG tablet Take 1 tablet (50 mcg total) by mouth daily before breakfast. 08/15/13  Yes Kunio Cummiskey P Aliea Bobe, DO     ROS: The patient denies fevers, chills, night sweats, unintentional weight loss, chest pain, palpitations, wheezing, dyspnea on exertion, nausea, vomiting, abdominal pain, dysuria, hematuria, melena, numbness, weakness, or tingling.   All other systems have been reviewed and were otherwise negative with the exception of those  mentioned in the HPI and as above.    PHYSICAL EXAM: Filed Vitals:   09/04/13 0816  BP: 118/82  Pulse: 73  Temp: 98.3 F (36.8 C)  Resp: 16   Filed Vitals:   09/04/13 0816  Height: 5' 5.5" (1.664 m)  Weight: 224 lb 9.6 oz (101.878 kg)   Body mass index is 36.79 kg/(m^2).  General: Alert, no acute distress HEENT:  Normocephalic, atraumatic, oropharynx patent. EOMI, PERRLA, fundo exam nl, TM nl Cardiovascular:  Regular rate and rhythm, no rubs murmurs or gallops.  No Carotid bruits, radial pulse intact. No pedal edema.  Respiratory: Clear to auscultation bilaterally.  No wheezes, rales, or rhonchi.  No cyanosis, no use of accessory musculature GI: No organomegaly, abdomen is soft and non-tender, positive bowel sounds.  No masses. Skin: No rashes. Neurologic: Facial musculature symmetric. Psychiatric: Patient is appropriate throughout our interaction. Lymphatic: No cervical lymphadenopathy Musculoskeletal: Gait intact. Breast exam normal GU normal, no cervix Rectal exam-+ hemorrhoids, rectal abrasion, no masses  LABS: Results for orders placed in visit on 08/15/13  COMPREHENSIVE METABOLIC PANEL      Result Value Ref Range   Sodium 138  135 - 145 mEq/L   Potassium 3.8  3.5 - 5.3 mEq/L   Chloride 102  96 - 112 mEq/L   CO2 29  19 -  32 mEq/L   Glucose, Bld 85  70 - 99 mg/dL   BUN 13  6 - 23 mg/dL   Creat 1.61 (*) 0.96 - 1.10 mg/dL   Total Bilirubin 0.7  0.2 - 1.2 mg/dL   Alkaline Phosphatase 88  39 - 117 U/L   AST 16  0 - 37 U/L   ALT 9  0 - 35 U/L   Total Protein 7.8  6.0 - 8.3 g/dL   Albumin 4.5  3.5 - 5.2 g/dL   Calcium 8.6  8.4 - 04.5 mg/dL  TSH      Result Value Ref Range   TSH 79.144 (*) 0.350 - 4.500 uIU/mL  MICROALBUMIN, URINE      Result Value Ref Range   Microalb, Ur 1.56  0.00 - 1.89 mg/dL  POCT CBC      Result Value Ref Range   WBC 5.5  4.6 - 10.2 K/uL   Lymph, poc 2.2  0.6 - 3.4   POC LYMPH PERCENT 39.8  10 - 50 %L   MID (cbc) 0.3  0 - 0.9   POC MID  % 5.6  0 - 12 %M   POC Granulocyte 3.0  2 - 6.9   Granulocyte percent 54.6  37 - 80 %G   RBC 3.87 (*) 4.04 - 5.48 M/uL   Hemoglobin 11.2 (*) 12.2 - 16.2 g/dL   HCT, POC 40.9 (*) 81.1 - 47.9 %   MCV 95.2  80 - 97 fL   MCH, POC 28.9  27 - 31.2 pg   MCHC 30.4 (*) 31.8 - 35.4 g/dL   RDW, POC 91.4     Platelet Count, POC 268  142 - 424 K/uL   MPV 8.2  0 - 99.8 fL     EKG/XRAY:   Primary read interpreted by Dr. Conley Rolls at [facility].   ASSESSMENT/PLAN: Encounter Diagnoses  Name Primary?  . Annual physical exam Yes  . HTN (hypertension)   . Unspecified hypothyroidism   . History of abnormal cervical Pap smear   . Unspecified constipation    Needs mammogram Labs pending F/u in 3 months for HTN and h/o medication noncompliance Keep appt with Dr Lucianne Muss for endocrinology I have asked her to sign a release to see path result for abd hystrectomy  Gross sideeffects, risk and benefits, and alternatives of medications d/w patient. Patient is aware that all medications have potential sideeffects and we are unable to predict every sideeffect or drug-drug interaction that may occur.  Twala Collings PHUONG, DO 09/04/2013 9:14 AM

## 2013-09-06 ENCOUNTER — Other Ambulatory Visit: Payer: Self-pay | Admitting: Family Medicine

## 2013-09-06 DIAGNOSIS — Z1239 Encounter for other screening for malignant neoplasm of breast: Secondary | ICD-10-CM

## 2013-09-08 ENCOUNTER — Ambulatory Visit: Payer: BC Managed Care – PPO | Admitting: Endocrinology

## 2013-09-08 LAB — PAP IG W/ RFLX HPV ASCU

## 2013-09-11 ENCOUNTER — Ambulatory Visit (INDEPENDENT_AMBULATORY_CARE_PROVIDER_SITE_OTHER): Payer: BC Managed Care – PPO | Admitting: Endocrinology

## 2013-09-11 ENCOUNTER — Encounter: Payer: Self-pay | Admitting: Endocrinology

## 2013-09-11 VITALS — BP 118/78 | HR 68 | Temp 98.1°F | Resp 16 | Ht 66.5 in | Wt 226.8 lb

## 2013-09-11 DIAGNOSIS — E039 Hypothyroidism, unspecified: Secondary | ICD-10-CM

## 2013-09-11 MED ORDER — LEVOTHYROXINE SODIUM 150 MCG PO TABS: 150.0000 ug | ORAL_TABLET | Freq: Every day | ORAL | Status: DC

## 2013-09-11 NOTE — Patient Instructions (Signed)
Need total of 150ug (3 of the 50s)

## 2013-09-11 NOTE — Progress Notes (Addendum)
Patient ID: Danielle Khan, female   DOB: Mar 06, 1971, 43 y.o.   MRN: [id number]   Reason for Appointment:  Hypothyroidism, followup visit    History of Present Illness:   The patient was seen in consultation in 03/2012 for management of her hyperthyroidism. She had had Graves' disease since the year 2001 and had been on long-term methimazole treatment but had not been on any medications for 2 years prior to evaluation. Because of persistent hyperthyroidism and a free T4 level of 1.65 she was given 16.75 mCi of I-131 on 05/02/12 She had been given information on the I-131 treatment including long-term hypothyroidism but she did not followup as scheduled. She says she was not able to get time off to be seen.  The patient has been complaining of feeling cold, tired and has some hair shedding. She was thinking that she is tired because of her busy work schedule. Has not gained any weight She has been treated with levothyroxine 50 mcg by her PCP and recently has been taking this regularly but does not feel any better and her TSH is still very high  Lab Results  Component Value Date   TSH 79.144* 08/15/2013   TSH 71.724* 01/17/2013       Medication List       This list is accurate as of: 09/11/13  8:52 AM.  Always use your most recent med list.               atenolol 50 MG tablet  Commonly known as:  TENORMIN  Take 1 tablet (50 mg total) by mouth daily.     chlorthalidone 25 MG tablet  Commonly known as:  HYGROTON  Take 1 tablet (25 mg total) by mouth daily.     levothyroxine 50 MCG tablet  Commonly known as:  SYNTHROID, LEVOTHROID  Take 1 tablet (50 mcg total) by mouth daily before breakfast.        Allergies: No Known Allergies  Past Medical History  Diagnosis Date  . Hypertension   . Thyroid disease     Past Surgical History  Procedure Laterality Date  . Abdominal hysterectomy      No family history on file.  Social History:  reports that she has never smoked.  She does not have any smokeless tobacco history on file. She reports that she drinks alcohol. She reports that she does not use illicit drugs.  REVIEW Of SYSTEMS:  Weight history:  Wt Readings from Last 3 Encounters:  09/11/13 226 lb 12.8 oz (102.876 kg)  09/04/13 224 lb 9.6 oz (101.878 kg)  08/15/13 231 lb (104.781 kg)   Hypertension: She continues to be on atenolol and chlorthalidone. Appears to have mild persistent hypokalemia, this is followed by PCP   Examination:   BP 118/78  Pulse 68  Temp(Src) 98.1 F (36.7 C)  Resp 16  Ht 5' 6.5" (1.689 m)  Wt 226 lb 12.8 oz (102.876 kg)  BMI 36.06 kg/m2  SpO2 95%  GENERAL APPEARANCE: Alert, has puffiness of the face & eyes  NECK: Thyroid is not palpable           NEUROLOGIC EXAM:  biceps reflexes show slow relaxation Skin: Not unusual dry, no ankle or hand edema    Assessment   Hypothyroidism, post ablative which is under treated at this time and she is both symptomatic and looking myxedematous today Discussed with patient the need for adequate replacement for her hypothyroidism and need for lifelong treatment and regular followup  Treatment:  Levothyroxine 150 mcg daily. She will take this in the morning regularly and followup in 6 weeks with repeat thyroid functions  Dozier Berkovich 09/11/2013, 8:52 AM

## 2013-09-24 ENCOUNTER — Ambulatory Visit (HOSPITAL_COMMUNITY): Payer: BC Managed Care – PPO

## 2013-10-23 ENCOUNTER — Other Ambulatory Visit: Payer: Self-pay

## 2013-10-27 ENCOUNTER — Ambulatory Visit: Payer: Self-pay | Admitting: Endocrinology

## 2013-11-20 ENCOUNTER — Other Ambulatory Visit (INDEPENDENT_AMBULATORY_CARE_PROVIDER_SITE_OTHER): Payer: BC Managed Care – PPO

## 2013-11-20 DIAGNOSIS — E039 Hypothyroidism, unspecified: Secondary | ICD-10-CM

## 2013-11-20 LAB — TSH: TSH: 0.13 u[IU]/mL — AB (ref 0.35–4.50)

## 2013-11-25 ENCOUNTER — Encounter: Payer: Self-pay | Admitting: Endocrinology

## 2013-11-25 ENCOUNTER — Ambulatory Visit (INDEPENDENT_AMBULATORY_CARE_PROVIDER_SITE_OTHER): Payer: BC Managed Care – PPO | Admitting: Endocrinology

## 2013-11-25 VITALS — BP 118/74 | HR 73 | Temp 97.8°F | Resp 16 | Ht 66.5 in | Wt 226.0 lb

## 2013-11-25 DIAGNOSIS — E039 Hypothyroidism, unspecified: Secondary | ICD-10-CM

## 2013-11-25 DIAGNOSIS — E890 Postprocedural hypothyroidism: Secondary | ICD-10-CM

## 2013-11-25 MED ORDER — LEVOTHYROXINE SODIUM 125 MCG PO TABS: 125.0000 ug | ORAL_TABLET | Freq: Every day | ORAL | Status: DC

## 2013-11-25 NOTE — Progress Notes (Signed)
Patient ID: Danielle Khan, female   DOB: [birth year], 43 y.o.   MRN: [id number]   Reason for Appointment:  Hypothyroidism, followup visit    History of Present Illness:   The patient was first seen in consultation in 03/2012 for management of her hyperthyroidism. She had had Graves' disease since the year 2001 and had been on long-term methimazole treatment but had not been on any dictations for 2 years prior to evaluation. Because of persistent hyperthyroidism and a free T4 level of 1.65 she was given 16.75 mCi of I-131 on 05/02/12 She had been given information on the I-131 treatment including long-term hypothyroidism but she did not followup as scheduled.  When seen for followup in 3/15 she was significantly hypothyroid with symptoms of feeling less cold, tired and  some hair shedding.  She was only taking 50 mcg of levothyroxine started the previous month She was told to increase her dosage to 150 mcg With this her symptoms above are significantly better and her weight has been stable also The patient has been very compliant with her medication daily  Lab Results  Component Value Date   TSH 0.13* 11/20/2013   TSH 79.144* 08/15/2013   TSH 71.724* 01/17/2013        Medication List       This list is accurate as of: 11/25/13  1:22 PM.  Always use your most recent med list.               atenolol 50 MG tablet  Commonly known as:  TENORMIN  Take 1 tablet (50 mg total) by mouth daily.     chlorthalidone 25 MG tablet  Commonly known as:  HYGROTON  Take 1 tablet (25 mg total) by mouth daily.     levothyroxine 150 MCG tablet  Commonly known as:  SYNTHROID, LEVOTHROID  Take 1 tablet (150 mcg total) by mouth daily before breakfast.        Allergies: No Known Allergies  Past Medical History  Diagnosis Date  . Hypertension   . Thyroid disease     Past Surgical History  Procedure Laterality Date  . Abdominal hysterectomy      No family history on  file.  Social History:  reports that she has never smoked. She does not have any smokeless tobacco history on file. She reports that she drinks alcohol. She reports that she does not use illicit drugs.  REVIEW Of SYSTEMS:  Weight history:  Wt Readings from Last 3 Encounters:  11/25/13 226 lb (102.513 kg)  09/11/13 226 lb 12.8 oz (102.876 kg)  09/04/13 224 lb 9.6 oz (101.878 kg)   Hypertension: She continues to be on atenolol and chlorthalidone. Appears to have mild persistent hypokalemia, this is followed by PCP   Examination:   BP 118/74  Pulse 73  Temp(Src) 97.8 F (36.6 C)  Resp 16  Ht 5' 6.5" (1.689 m)  Wt 226 lb (102.513 kg)  BMI 35.94 kg/m2  SpO2 96%  GENERAL APPEARANCE: Alert, no significant puffiness of the face & eyes  NECK: Thyroid is not palpable           NEUROLOGIC EXAM:  biceps reflexes show normal relaxation Skin: Not unusual dry, no ankle  edema    Assessment   Hypothyroidism, post ablative now supplemented with 150 mcg of levothyroxine Although she is subjectively feeling better her TSH is now relatively low although she has no symptoms of hyperthyroidism    Treatment:  Levothyroxine 125 mcg daily.  She will take this in the morning regularly and followup in 12 weeks with repeat thyroid functions Reminded her about the need for regular followup and lifelong supplementation  Syris Brookens 11/25/2013, 1:22 PM

## 2013-11-25 NOTE — Patient Instructions (Signed)
Change Rx as directed

## 2014-02-18 ENCOUNTER — Other Ambulatory Visit (INDEPENDENT_AMBULATORY_CARE_PROVIDER_SITE_OTHER): Payer: BC Managed Care – PPO

## 2014-02-18 DIAGNOSIS — E039 Hypothyroidism, unspecified: Secondary | ICD-10-CM

## 2014-02-18 LAB — T4, FREE: FREE T4: 1.24 ng/dL (ref 0.60–1.60)

## 2014-02-18 LAB — TSH: TSH: 0.41 u[IU]/mL (ref 0.35–4.50)

## 2014-02-19 ENCOUNTER — Other Ambulatory Visit: Payer: Self-pay

## 2014-02-20 ENCOUNTER — Other Ambulatory Visit: Payer: Self-pay | Admitting: Family Medicine

## 2014-02-25 ENCOUNTER — Encounter: Payer: Self-pay | Admitting: Endocrinology

## 2014-02-25 ENCOUNTER — Ambulatory Visit (INDEPENDENT_AMBULATORY_CARE_PROVIDER_SITE_OTHER): Payer: BC Managed Care – PPO | Admitting: Endocrinology

## 2014-02-25 VITALS — BP 102/73 | HR 79 | Temp 98.0°F | Resp 16 | Ht 66.5 in | Wt 232.6 lb

## 2014-02-25 DIAGNOSIS — E890 Postprocedural hypothyroidism: Secondary | ICD-10-CM

## 2014-02-25 DIAGNOSIS — I10 Essential (primary) hypertension: Secondary | ICD-10-CM

## 2014-02-25 NOTE — Progress Notes (Signed)
Patient ID: Danielle Khan, female   DOB: 1970-07-03, 43 y.o.   MRN: [id number]   Reason for Appointment:  Hypothyroidism, followup visit    History of Present Illness:   The patient was first seen in consultation in 03/2012 for management of her hyperthyroidism. She had had Graves' disease since the year 2001 and had been on long-term methimazole treatment but had not been on any dictations for 2 years prior to evaluation. Because of persistent hyperthyroidism and a free T4 level of 1.65 she was given 16.75 mCi of I-131 on 05/02/12 She had been given information on the I-131 treatment including long-term hypothyroidism but she did not followup as scheduled.  When seen for followup in 3/15 she was significantly hypothyroid with symptoms of feeling less cold, tired and  some hair shedding.   She initially given 150 mcg on her first visit here With this her symptoms above were significantly better  However because of relatively low TSH in 6/15 and her dose was reduced to 125 mcg She does not feel any different now and does not complain of unusual fatigue  The patient has been very compliant with her medication daily  Lab Results  Component Value Date   FREET4 1.24 02/18/2014   TSH 0.41 02/18/2014   TSH 0.13* 11/20/2013   TSH 79.144* 08/15/2013        Medication List       This list is accurate as of: 02/25/14 10:54 AM.  Always use your most recent med list.               atenolol 50 MG tablet  Commonly known as:  TENORMIN  Take 1 tablet (50 mg total) by mouth daily. PATIENT NEEDS OFFICE VISIT FOR ADDITIONAL REFILLS     chlorthalidone 25 MG tablet  Commonly known as:  HYGROTON  Take 1 tablet (25 mg total) by mouth daily.     levothyroxine 125 MCG tablet  Commonly known as:  SYNTHROID, LEVOTHROID  Take 1 tablet (125 mcg total) by mouth daily before breakfast.        Allergies: No Known Allergies  Past Medical History  Diagnosis Date  . Hypertension   .  Thyroid disease     Past Surgical History  Procedure Laterality Date  . Abdominal hysterectomy      No family history on file.  Social History:  reports that she has never smoked. She does not have any smokeless tobacco history on file. She reports that she drinks alcohol. She reports that she does not use illicit drugs.  REVIEW Of SYSTEMS:  Weight history:  Wt Readings from Last 3 Encounters:  02/25/14 232 lb 9.6 oz (105.507 kg)  11/25/13 226 lb (102.513 kg)  09/11/13 226 lb 12.8 oz (102.876 kg)   Hypertension: She continues to be on atenolol and chlorthalidone. Appears to have mild persistent hypokalemia, no recent labs available for review   Examination:   BP 102/73  Pulse 79  Temp(Src) 98 F (36.7 C)  Resp 16  Ht 5' 6.5" (1.689 m)  Wt 232 lb 9.6 oz (105.507 kg)  BMI 36.98 kg/m2  SpO2 99%  GENERAL APPEARANCE: Has mild puffiness of the face, no cushingoid  NECK: Thyroid is not palpable           NEUROLOGIC EXAM:  biceps reflexes show normal relaxation Skin: Not unusual dry, no ankle  edema    Assessment   Hypothyroidism, post ablative, now supplemented with 125 mcg of levothyroxine  Her TSH is in the normal range now although still low normal at 0.4  Hypertension: Pressure is relatively low today   Treatment:  Levothyroxine 125 mcg daily except half tablet on Sundays. She will take this in the morning regularly and followup in 6 months  Reduce chlorthalidone to every other day because of low normal blood pressure  Shadae Reino 02/25/2014, 10:54 AM

## 2014-02-25 NOTE — Patient Instructions (Addendum)
Same dose except 1/2 tab on Sundays  Reduce fluid pill to 1/2 daily or every 2 days

## 2014-04-07 ENCOUNTER — Other Ambulatory Visit: Payer: Self-pay | Admitting: Family Medicine

## 2014-04-19 ENCOUNTER — Other Ambulatory Visit: Payer: Self-pay | Admitting: Endocrinology

## 2014-05-23 ENCOUNTER — Other Ambulatory Visit: Payer: Self-pay | Admitting: Physician Assistant

## 2014-05-23 NOTE — Telephone Encounter (Signed)
Spoke w/pt who stated she will try to get in to see Dr Conley RollsLe tomorrow morn. I sent in a weeks' RF to cover her since she is out.

## 2014-06-30 ENCOUNTER — Ambulatory Visit (INDEPENDENT_AMBULATORY_CARE_PROVIDER_SITE_OTHER): Payer: BLUE CROSS/BLUE SHIELD | Admitting: Family Medicine

## 2014-06-30 VITALS — BP 160/98 | HR 80 | Temp 98.4°F | Resp 18 | Ht 67.0 in | Wt 232.8 lb

## 2014-06-30 DIAGNOSIS — I10 Essential (primary) hypertension: Secondary | ICD-10-CM

## 2014-06-30 DIAGNOSIS — J069 Acute upper respiratory infection, unspecified: Secondary | ICD-10-CM

## 2014-06-30 MED ORDER — ATENOLOL 50 MG PO TABS: 50.0000 mg | ORAL_TABLET | Freq: Every day | ORAL | Status: DC

## 2014-06-30 MED ORDER — HYDROCODONE-HOMATROPINE 5-1.5 MG/5ML PO SYRP: 5.0000 mL | ORAL_SOLUTION | Freq: Three times a day (TID) | ORAL | Status: DC | PRN

## 2014-06-30 MED ORDER — CHLORTHALIDONE 25 MG PO TABS: 25.0000 mg | ORAL_TABLET | Freq: Every day | ORAL | Status: DC

## 2014-06-30 NOTE — Progress Notes (Signed)
This is a 44 year old woman with a history of I-131 radioablation of her thyroid who is currently euthyroid. She ran out of her blood pressure medicine about 9 days ago.  Patient denies any chest pain, shortness of breath, ankle edema, or headache.  Patient's had high blood pressure for a number of years and she's here for refill of her medicine.  Patient also complains about cough of a month's duration.  Objective: HEENT: Normal Patient's alert and cooperative and in no distress Chest is clear Heart: Regular no murmur Abdomen: Soft nontender  Assessment: Essential hypertension  Plan: Renew medications: Chlorthalidone 25 daily and a atenolol 50 mg daily Hydromet prescription given to control cough  Recheck 6 months.  Elvina SidleKurt Ezra Denne, MD

## 2014-08-23 ENCOUNTER — Other Ambulatory Visit (INDEPENDENT_AMBULATORY_CARE_PROVIDER_SITE_OTHER): Payer: BLUE CROSS/BLUE SHIELD

## 2014-08-23 DIAGNOSIS — E890 Postprocedural hypothyroidism: Secondary | ICD-10-CM

## 2014-08-23 LAB — T4, FREE: Free T4: 0.81 ng/dL (ref 0.60–1.60)

## 2014-08-23 LAB — TSH: TSH: 10.76 u[IU]/mL — ABNORMAL HIGH (ref 0.35–4.50)

## 2014-08-26 ENCOUNTER — Encounter: Payer: Self-pay | Admitting: Endocrinology

## 2014-08-26 ENCOUNTER — Ambulatory Visit (INDEPENDENT_AMBULATORY_CARE_PROVIDER_SITE_OTHER): Payer: BLUE CROSS/BLUE SHIELD | Admitting: Endocrinology

## 2014-08-26 VITALS — BP 115/78 | HR 72 | Temp 97.7°F | Resp 16 | Ht 67.0 in | Wt 234.0 lb

## 2014-08-26 DIAGNOSIS — E890 Postprocedural hypothyroidism: Secondary | ICD-10-CM | POA: Insufficient documentation

## 2014-08-26 MED ORDER — LEVOTHYROXINE SODIUM 150 MCG PO TABS: 150.0000 ug | ORAL_TABLET | Freq: Every day | ORAL | Status: DC

## 2014-08-26 NOTE — Progress Notes (Signed)
Patient ID: Danielle Khan, female   DOB: [birth year], 44 y.o.   MRN: [id number]   Reason for Appointment:  Hypothyroidism, followup visit    History of Present Illness:   The patient was first seen in consultation in 03/2012 for management of her hyperthyroidism. She had had Graves' disease since the year 2001 and had been on long-term methimazole treatment but had not been on any dictations for 2 years prior to evaluation. Because of persistent hyperthyroidism and a free T4 level of 1.65 she was given 16.75 mCi of I-131 on 05/02/12  When finally seen for followup in 3/15 she was significantly hypothyroid with symptoms of feeling less cold, tired and  some hair shedding.  She initially given 150 mcg on her first visit    However because of relatively low TSH in 6/15 and her dose was reduced to 125 mcg and she was supposed to take 6-1/2 tablets a week since her visit in 9/15 but she is taking 1 tablet daily Does not take any vitamins or calcium at the same time She does not feel any different now and she tends to have fatigue which she thinks is from her work hours Also complains of more cord intolerance now  The patient has been very compliant with her medication daily  Lab Results  Component Value Date   FREET4 0.81 08/23/2014   FREET4 1.24 02/18/2014   TSH 10.76* 08/23/2014   TSH 0.41 02/18/2014   TSH 0.13* 11/20/2013   Wt Readings from Last 3 Encounters:  08/26/14 234 lb (106.142 kg)  06/30/14 232 lb 12.8 oz (105.597 kg)  02/25/14 232 lb 9.6 oz (105.507 kg)       Medication List       This list is accurate as of: 08/26/14 10:17 AM.  Always use your most recent med list.               atenolol 50 MG tablet  Commonly known as:  TENORMIN  Take 1 tablet (50 mg total) by mouth daily.     chlorthalidone 25 MG tablet  Commonly known as:  HYGROTON  Take 1 tablet (25 mg total) by mouth daily.     HYDROcodone-homatropine 5-1.5 MG/5ML syrup  Commonly known  as:  HYCODAN  Take 5 mLs by mouth every 8 (eight) hours as needed for cough.     levothyroxine 125 MCG tablet  Commonly known as:  SYNTHROID, LEVOTHROID  TAKE ONE TABLET BY MOUTH ONCE DAILY BEFORE  BREAKFAST        Allergies: No Known Allergies  Past Medical History  Diagnosis Date  . Hypertension   . Thyroid disease     Past Surgical History  Procedure Laterality Date  . Abdominal hysterectomy      No family history on file.  Social History:  reports that she has never smoked. She does not have any smokeless tobacco history on file. She reports that she drinks alcohol. She reports that she does not use illicit drugs.  REVIEW Of SYSTEMS:  Weight history:  Wt Readings from Last 3 Encounters:  08/26/14 234 lb (106.142 kg)  06/30/14 232 lb 12.8 oz (105.597 kg)  02/25/14 232 lb 9.6 oz (105.507 kg)   Hypertension: She continues to be on atenolol and chlorthalidone. Has history of hypokalemia also   Examination:   BP 115/78 mmHg  Pulse 72  Temp(Src) 97.7 F (36.5 C)  Resp 16  Ht  (1.702 m)  Wt 234 lb (106.142 kg)  BMI 36.64 kg/m2  SpO2 99%  Has mild puffiness of the face, no cushingoid.  Eyes not puffy or prominent  NECK: Thyroid is not palpable           NEUROLOGIC EXAM:  biceps/triceps reflexes show normal relaxation Skin: Not unusual dry, no ankle  edema    Assessment   Hypothyroidism, post ablative, now supplemented with 125 mcg of levothyroxine Although she has had some nonspecific fatigue for some time this may be not related to her hypothyroidism Also complains of cold intolerance  Her TSH is significantly high even without her missing any doses recently  Hypertension: Pressure is normal   Treatment:  Levothyroxine 150 mcg daily; she can use up her 125 prescription with taking extra tablet once a week She will take this in the morning regularly and followup in 3 months   Haroldine Redler 08/26/2014, 10:17 AM

## 2014-11-23 ENCOUNTER — Other Ambulatory Visit (INDEPENDENT_AMBULATORY_CARE_PROVIDER_SITE_OTHER): Payer: BLUE CROSS/BLUE SHIELD

## 2014-11-23 DIAGNOSIS — E890 Postprocedural hypothyroidism: Secondary | ICD-10-CM | POA: Diagnosis not present

## 2014-11-23 LAB — TSH: TSH: 15.15 u[IU]/mL — ABNORMAL HIGH (ref 0.35–4.50)

## 2014-11-26 ENCOUNTER — Encounter: Payer: Self-pay | Admitting: Endocrinology

## 2014-11-26 ENCOUNTER — Ambulatory Visit (INDEPENDENT_AMBULATORY_CARE_PROVIDER_SITE_OTHER): Payer: BLUE CROSS/BLUE SHIELD | Admitting: Emergency Medicine

## 2014-11-26 ENCOUNTER — Ambulatory Visit (INDEPENDENT_AMBULATORY_CARE_PROVIDER_SITE_OTHER): Payer: BLUE CROSS/BLUE SHIELD | Admitting: Endocrinology

## 2014-11-26 VITALS — BP 126/86 | HR 70 | Temp 98.7°F | Resp 18 | Ht 66.75 in | Wt 231.4 lb

## 2014-11-26 VITALS — BP 158/96 | HR 77 | Temp 97.7°F | Resp 14 | Ht 67.0 in | Wt 231.2 lb

## 2014-11-26 DIAGNOSIS — L309 Dermatitis, unspecified: Secondary | ICD-10-CM | POA: Diagnosis not present

## 2014-11-26 DIAGNOSIS — I10 Essential (primary) hypertension: Secondary | ICD-10-CM

## 2014-11-26 DIAGNOSIS — H68002 Unspecified Eustachian salpingitis, left ear: Secondary | ICD-10-CM

## 2014-11-26 DIAGNOSIS — E890 Postprocedural hypothyroidism: Secondary | ICD-10-CM | POA: Diagnosis not present

## 2014-11-26 MED ORDER — LEVOTHYROXINE SODIUM 175 MCG PO TABS: 175.0000 ug | ORAL_TABLET | Freq: Every day | ORAL | Status: DC

## 2014-11-26 MED ORDER — TRIAMCINOLONE ACETONIDE 0.1 % EX CREA: 1.0000 "application " | TOPICAL_CREAM | Freq: Two times a day (BID) | CUTANEOUS | Status: DC

## 2014-11-26 MED ORDER — TRIAMCINOLONE ACETONIDE 55 MCG/ACT NA AERO: 2.0000 | INHALATION_SPRAY | Freq: Every day | NASAL | Status: DC

## 2014-11-26 NOTE — Patient Instructions (Signed)
Barotitis Media Barotitis media is inflammation of your middle ear. This occurs when the auditory tube (eustachian tube) leading from the back of your nose (nasopharynx) to your eardrum is blocked. This blockage may result from a cold, environmental allergies, or an upper respiratory infection. Unresolved barotitis media may lead to damage or hearing loss (barotrauma), which may become permanent. HOME CARE INSTRUCTIONS   Use medicines as recommended by your health care provider. Over-the-counter medicines will help unblock the canal and can help during times of air travel.  Do not put anything into your ears to clean or unplug them. Eardrops will not be helpful.  Do not swim, dive, or fly until your health care provider says it is all right to do so. If these activities are necessary, chewing gum with frequent, forceful swallowing may help. It is also helpful to hold your nose and gently blow to pop your ears for equalizing pressure changes. This forces air into the eustachian tube.  Only take over-the-counter or prescription medicines for pain, discomfort, or fever as directed by your health care provider.  A decongestant may be helpful in decongesting the middle ear and make pressure equalization easier. SEEK MEDICAL CARE IF:  You experience a serious form of dizziness in which you feel as if the room is spinning and you feel nauseated (vertigo).  Your symptoms only involve one ear. SEEK IMMEDIATE MEDICAL CARE IF:   You develop a severe headache, dizziness, or severe ear pain.  You have bloody or pus-like drainage from your ears.  You develop a fever.  Your problems do not improve or become worse. MAKE SURE YOU:   Understand these instructions.  Will watch your condition.  Will get help right away if you are not doing well or get worse. Document Released: 06/01/2000 Document Revised: 03/25/2013 Document Reviewed: 12/30/2012 [employer] Patient Information 2015 [employer]. This  information is not intended to replace advice given to you by your health care provider. Make sure you discuss any questions you have with your health care provider.  

## 2014-11-26 NOTE — Progress Notes (Signed)
Patient ID: Danielle Khan, female   DOB: 1971-04-24, 44 y.o.   MRN: [id number]   Reason for Appointment:  Hypothyroidism, followup visit    History of Present Illness:   The patient was first seen in consultation in 03/2012 for management of her hyperthyroidism. She had had Graves' disease since the year 2001 and had been on long-term methimazole treatment but had not been on any dictations for 2 years prior to evaluation. Because of persistent hyperthyroidism and a free T4 level of 1.65 she was given 16.75 mCi of I-131 on 05/02/12  When finally seen for followup in 3/15 she was significantly hypothyroid with symptoms of feeling less cold, tired and  some hair shedding.  She initially given 150 mcg on her first visit    However because of relatively low TSH in 6/15 and her dose was reduced to 125 mcg  Subsequently she has had progressively higher TSH levels even with good compliance for medications in the morning daily Her does was increased to 150 g in 3/16  Does not take any vitamins or calcium at the same time  She tends to have fatigue which she thinks is worse recently Also complains of  cold intolerance   TSH is now higher than on her last visit   Lab Results  Component Value Date   TSH 15.15* 11/23/2014   TSH 10.76* 08/23/2014   TSH 0.41 02/18/2014   FREET4 0.81 08/23/2014   FREET4 1.24 02/18/2014    Wt Readings from Last 3 Encounters:  11/26/14 231 lb 3.2 oz (104.872 kg)  08/26/14 234 lb (106.142 kg)  06/30/14 232 lb 12.8 oz (105.597 kg)       Medication List       This list is accurate as of: 11/26/14 10:22 AM.  Always use your most recent med list.               atenolol 50 MG tablet  Commonly known as:  TENORMIN  Take 1 tablet (50 mg total) by mouth daily.     chlorthalidone 25 MG tablet  Commonly known as:  HYGROTON  Take 1 tablet (25 mg total) by mouth daily.     HYDROcodone-homatropine 5-1.5 MG/5ML syrup  Commonly known as:   HYCODAN  Take 5 mLs by mouth every 8 (eight) hours as needed for cough.     levothyroxine 150 MCG tablet  Commonly known as:  SYNTHROID, LEVOTHROID  Take 1 tablet (150 mcg total) by mouth daily.        Allergies: No Known Allergies  Past Medical History  Diagnosis Date  . Hypertension   . Thyroid disease     Past Surgical History  Procedure Laterality Date  . Abdominal hysterectomy      No family history on file.  Social History:  reports that she has never smoked. She does not have any smokeless tobacco history on file. She reports that she drinks alcohol. She reports that she does not use illicit drugs.  REVIEW Of SYSTEMS:  Weight history:  Wt Readings from Last 3 Encounters:  11/26/14 231 lb 3.2 oz (104.872 kg)  08/26/14 234 lb (106.142 kg)  06/30/14 232 lb 12.8 oz (105.597 kg)   Hypertension: She continues to be on atenolol and chlorthalidone. Has history of hypokalemia also, followed by PCP   Examination:   BP 158/96 mmHg  Pulse 77  Temp(Src) 97.7 F (36.5 C)  Resp 14  Ht  (1.702 m)  Wt 231 lb 3.2  oz (104.872 kg)  BMI 36.20 kg/m2  SpO2 99%  Has mild puffiness of the face Eyes show normal external appearance  NECK: Thyroid is not palpable           NEUROLOGIC EXAM:  biceps reflexes show normal relaxation    Assessment   Hypothyroidism, post ablative, now supplemented with 150 mcg of levothyroxine She has had more fatigue lately also  Her TSH is significantly high even with her increasing the dose and been compliant with her medication and not using any interacting substances She may be having a progression of her hypothyroidism at this stage  Hypertension: Blood pressure is poorly controlled and she needs to be seen by PCP, she will make an appointment   Treatment:  Levothyroxine 175 mcg daily; she can use up her 150 prescription with taking extra tablet once a week She will again try to avoid any vitamins or calcium at the same time 1  open 6 weeks   Sandra Brents 11/26/2014, 10:22 AM

## 2014-11-26 NOTE — Progress Notes (Signed)
Subjective:  Patient ID: Danielle Khan, female    DOB: Jul 30, 1970  Age: 44 y.o. MRN: [id number]  CC: Other; Rash; and Otitis Media   HPI Danielle Khan presents  with numerous complaints. She has a history of hypertension when to see her endocrinologist today who saw that she had a markedly elevated blood pressure. She was asymptomatic with an elevated pressure. She said she had taken it a cold pill prior to visit and she thinks it may have affected her pressure. He sent her here for follow-up.  She has nasal congestion and some pressure in her left here and pain. She has no drainage. No nasal discharge or drainage. She has no fever or chills. Has no interference with her hearing.  She is concerned about a rash on her forearms. She works with a lot of foodstuffs at work. Is no new industrial contact and no new personal care product soap or detergent. Says the rash is pruritic. Been present about a month. Had no improvement with over-the-counter medication.  Outpatient Prescriptions Prior to Visit  Medication Sig Dispense Refill  . atenolol (TENORMIN) 50 MG tablet Take 1 tablet (50 mg total) by mouth daily. 90 tablet 3  . levothyroxine (SYNTHROID) 175 MCG tablet Take 1 tablet (175 mcg total) by mouth daily before breakfast. 30 tablet 1  . chlorthalidone (HYGROTON) 25 MG tablet Take 1 tablet (25 mg total) by mouth daily. 90 tablet 3  . HYDROcodone-homatropine (HYCODAN) 5-1.5 MG/5ML syrup Take 5 mLs by mouth every 8 (eight) hours as needed for cough. (Patient not taking: Reported on 11/26/2014) 120 mL 0   No facility-administered medications prior to visit.    History   Social History  . Marital Status: Married    Spouse Name: N/A  . Number of Children: N/A  . Years of Education: N/A   Social History Main Topics  . Smoking status: Never Smoker   . Smokeless tobacco: Not on file  . Alcohol Use: Yes  . Drug Use: No  . Sexual Activity: Yes   Other Topics Concern  . None   Social  History Narrative    History reviewed. No pertinent family history.  Past Medical History  Diagnosis Date  . Hypertension   . Thyroid disease      Review of Systems  Constitutional: Negative for fever, chills and appetite change.  HENT: Positive for ear pain and rhinorrhea. Negative for congestion, postnasal drip, sinus pressure and sore throat.   Eyes: Negative for pain and redness.  Respiratory: Negative for cough, shortness of breath and wheezing.   Cardiovascular: Negative for leg swelling.  Gastrointestinal: Negative for nausea, vomiting, abdominal pain, diarrhea, constipation and blood in stool.  Endocrine: Negative for polyuria.  Genitourinary: Negative for dysuria, urgency, frequency and flank pain.  Musculoskeletal: Negative for gait problem.  Skin: Positive for rash.  Neurological: Negative for weakness and headaches.  Psychiatric/Behavioral: Negative for confusion and decreased concentration. The patient is not nervous/anxious.     Objective:  BP 126/86 mmHg  Pulse 70  Temp(Src) 98.7 F (37.1 C) (Oral)  Resp 18  Ht 5' 6.75" (1.695 m)  Wt 231 lb 6.4 oz (104.962 kg)  BMI 36.53 kg/m2  SpO2 98%  BP Readings from Last 3 Encounters:  11/26/14 126/86  11/26/14 158/96  08/26/14 115/78    Wt Readings from Last 3 Encounters:  11/26/14 231 lb 6.4 oz (104.962 kg)  11/26/14 231 lb 3.2 oz (104.872 kg)  08/26/14 234 lb (106.142 kg)  Physical Exam  Constitutional: She is oriented to person, place, and time. She appears well-developed and well-nourished. No distress.  HENT:  Head: Normocephalic and atraumatic.  Right Ear: External ear normal.  Left Ear: External ear normal. Tympanic membrane mobility is abnormal.  Nose: Nose normal.  Eyes: Conjunctivae and EOM are normal. Pupils are equal, round, and reactive to light. No scleral icterus.  Neck: Normal range of motion. Neck supple. No tracheal deviation present.  Cardiovascular: Normal rate, regular rhythm and  normal heart sounds.   Pulmonary/Chest: Effort normal. No respiratory distress. She has no wheezes. She has no rales.  Abdominal: She exhibits no mass. There is no tenderness. There is no rebound and no guarding.  Musculoskeletal: She exhibits no edema.  Lymphadenopathy:    She has no cervical adenopathy.  Neurological: She is alert and oriented to person, place, and time. Coordination normal.  Skin: Skin is warm and dry. Rash (fine rash on forearms.  pruritic.) noted.  Psychiatric: She has a normal mood and affect. Her behavior is normal.    Lab Results  Component Value Date   WBC 5.5 09/04/2013   HGB 11.1* 09/04/2013   HCT 33.7* 09/04/2013   PLT 245 09/04/2013   GLUCOSE 98 09/04/2013   CHOL 204* 09/04/2013   TRIG 142 09/04/2013   HDL 43 09/04/2013   LDLCALC 133* 09/04/2013   ALT 8 09/04/2013   AST 16 09/04/2013   NA 138 09/04/2013   K 3.4* 09/04/2013   CL 101 09/04/2013   CREATININE 1.12* 09/04/2013   BUN 12 09/04/2013   CO2 30 09/04/2013   TSH 15.15* 11/23/2014   MICROALBUR 1.56 08/15/2013      .  Assessment & Plan:   Lukesha was seen today for other, rash and otitis media.  Diagnoses and all orders for this visit:  Eustachian catarrh, left  Eczema  Essential hypertension  Other orders -     triamcinolone cream (KENALOG) 0.1 %; Apply 1 application topically 2 (two) times daily. -     triamcinolone (NASACORT AQ) 55 MCG/ACT AERO nasal inhaler; Place 2 sprays into the nose daily.  I am having Ms. Tillman start on triamcinolone cream and triamcinolone. I am also having her maintain her atenolol, chlorthalidone, HYDROcodone-homatropine, and levothyroxine.  Meds ordered this encounter  Medications  . triamcinolone cream (KENALOG) 0.1 %    Sig: Apply 1 application topically 2 (two) times daily.    Dispense:  30 g    Refill:  1  . triamcinolone (NASACORT AQ) 55 MCG/ACT AERO nasal inhaler    Sig: Place 2 sprays into the nose daily.    Dispense:  1 Inhaler     Refill:  12   Blood pressure was satisfactory in the office here I suggested she stop using the decongested combination pill that she is taking at home and follow back up here in a month for recheck of her blood pressure. She has no improvement with rash or pain. We should see her sooner.   Appropriate red flag conditions were discussed with the patient as well as actions that should be taken.  Patient expressed his understanding.  Follow-up: Return in about 4 weeks (around 12/24/2014).  Carmelina Dane, MD

## 2014-11-26 NOTE — Patient Instructions (Signed)
Take 2 pills on Saturday and one other days

## 2015-01-07 ENCOUNTER — Other Ambulatory Visit: Payer: Self-pay

## 2015-01-10 ENCOUNTER — Ambulatory Visit: Payer: Self-pay | Admitting: Endocrinology

## 2015-01-11 ENCOUNTER — Other Ambulatory Visit (INDEPENDENT_AMBULATORY_CARE_PROVIDER_SITE_OTHER): Payer: BLUE CROSS/BLUE SHIELD

## 2015-01-11 DIAGNOSIS — E890 Postprocedural hypothyroidism: Secondary | ICD-10-CM | POA: Diagnosis not present

## 2015-01-11 LAB — T4, FREE: Free T4: 1.7 ng/dL — ABNORMAL HIGH (ref 0.60–1.60)

## 2015-01-11 LAB — TSH: TSH: 0.13 u[IU]/mL — ABNORMAL LOW (ref 0.35–4.50)

## 2015-01-21 ENCOUNTER — Ambulatory Visit: Payer: Self-pay | Admitting: Endocrinology

## 2015-02-03 ENCOUNTER — Encounter: Payer: Self-pay | Admitting: Endocrinology

## 2015-02-03 ENCOUNTER — Ambulatory Visit (INDEPENDENT_AMBULATORY_CARE_PROVIDER_SITE_OTHER): Payer: Self-pay | Admitting: Endocrinology

## 2015-02-03 VITALS — BP 148/98 | HR 67 | Temp 97.7°F | Resp 14 | Ht 66.75 in | Wt 232.0 lb

## 2015-02-03 DIAGNOSIS — E890 Postprocedural hypothyroidism: Secondary | ICD-10-CM

## 2015-02-03 MED ORDER — LEVOTHYROXINE SODIUM 150 MCG PO TABS: 150.0000 ug | ORAL_TABLET | Freq: Every day | ORAL | Status: DC

## 2015-02-03 NOTE — Progress Notes (Signed)
Patient ID: Danielle Khan, female   DOB: Feb 07, 1971, 44 y.o.   MRN: [id number]   Reason for Appointment:  Hypothyroidism, followup visit    History of Present Illness:   The patient was first seen in consultation in 03/2012 for management of her hyperthyroidism. She had had Graves' disease since the year 2001 and had been on long-term methimazole treatment but had not been on any dictations for 2 years prior to evaluation. Because of persistent hyperthyroidism and a free T4 level of 1.65 she was given 16.75 mCi of I-131 on 05/02/12  When finally seen for followup in 3/15 she was significantly hypothyroid with symptoms of feeling less cold, tired and  some hair shedding.  She initially given 150 mcg on her first visit    However because of relatively low TSH in 6/15  her dose was reduced to 125 mcg  Subsequently she had progressively higher TSH levels even with good compliance for medications in the morning daily Her dose was increased to 150 g in 3/16 and to 175 g in 6/16  She was however reporting taking a One-A-Day vitamin for women with her Synthroid in the morning and this was stopped in June  Does not now take any vitamins or calcium at the same time  She tends to have fatigue which she thinks has been improved with increasing her thyroid medication  TSH is now suppressed compared to previous level of 15 and free T4 is slightly high   Lab Results  Component Value Date   TSH 0.13* 01/11/2015   TSH 15.15* 11/23/2014   TSH 10.76* 08/23/2014   FREET4 1.70* 01/11/2015   FREET4 0.81 08/23/2014   FREET4 1.24 02/18/2014    Wt Readings from Last 3 Encounters:  02/03/15 232 lb (105.235 kg)  11/26/14 231 lb 6.4 oz (104.962 kg)  11/26/14 231 lb 3.2 oz (104.872 kg)       Medication List       This list is accurate as of: 02/03/15  9:08 AM.  Always use your most recent med list.               atenolol 50 MG tablet  Commonly known as:  TENORMIN  Take 1  tablet (50 mg total) by mouth daily.     chlorthalidone 25 MG tablet  Commonly known as:  HYGROTON  Take 1 tablet (25 mg total) by mouth daily.     HYDROcodone-homatropine 5-1.5 MG/5ML syrup  Commonly known as:  HYCODAN  Take 5 mLs by mouth every 8 (eight) hours as needed for cough.     levothyroxine 150 MCG tablet  Commonly known as:  SYNTHROID, LEVOTHROID  Take 1 tablet (150 mcg total) by mouth daily.     triamcinolone 55 MCG/ACT Aero nasal inhaler  Commonly known as:  NASACORT AQ  Place 2 sprays into the nose daily.     triamcinolone cream 0.1 %  Commonly known as:  KENALOG  Apply 1 application topically 2 (two) times daily.        Allergies: No Known Allergies  Past Medical History  Diagnosis Date  . Hypertension   . Thyroid disease     Past Surgical History  Procedure Laterality Date  . Abdominal hysterectomy      No family history on file.  Social History:  reports that she has never smoked. She does not have any smokeless tobacco history on file. She reports that she drinks alcohol. She reports that she does not use  illicit drugs.  REVIEW Of SYSTEMS:  Weight history:  Wt Readings from Last 3 Encounters:  02/03/15 232 lb (105.235 kg)  11/26/14 231 lb 6.4 oz (104.962 kg)  11/26/14 231 lb 3.2 oz (104.872 kg)   Hypertension: She continues to be on atenolol and chlorthalidone. Has history of hypokalemia also, followed by PCP  She thinks she gets her blood pressure at work weekly and it is fairly good and has had visits with her PCP, no records available    Examination:   BP 148/98 mmHg  Pulse 67  Temp(Src) 97.7 F (36.5 C)  Resp 14  Ht 5' 6.75" (1.695 m)  Wt 232 lb (105.235 kg)  BMI 36.63 kg/m2  SpO2 98%   repeat blood pressure 150/98 with large cuff  she looks well Eyes show normal external appearance NEUROLOGIC EXAM:  biceps reflexes show normal relaxation.  No tremor     Assessment   Hypothyroidism, post ablative, now supplemented with  175 g  of levothyroxine Her Synthroid dose has been increased progressively over the last few months but now her TSH is suppressed Also probably was having some interaction of taking a vitamin with calcium and iron at the same time   Hypertension: Blood pressure is not well controlled and she needs to be seen by PCP, she will make an appointment   Treatment:  Levothyroxine 150 prescription given for 90 days She can use up her 175 dose with taking it 6 days a week  Follow-up in 3 months    Omarr Hann 02/03/2015, 9:08 AM

## 2015-02-03 NOTE — Patient Instructions (Signed)
Skip 1 pill per week No Nyquil

## 2015-02-07 ENCOUNTER — Other Ambulatory Visit: Payer: Self-pay

## 2015-02-07 DIAGNOSIS — Z1231 Encounter for screening mammogram for malignant neoplasm of breast: Secondary | ICD-10-CM

## 2015-02-11 ENCOUNTER — Ambulatory Visit
Admission: RE | Admit: 2015-02-11 | Discharge: 2015-02-11 | Disposition: A | Discharge status: Home / Self Care | Payer: BLUE CROSS/BLUE SHIELD | Source: Ambulatory Visit

## 2015-02-11 DIAGNOSIS — Z1231 Encounter for screening mammogram for malignant neoplasm of breast: Secondary | ICD-10-CM

## 2015-05-02 ENCOUNTER — Other Ambulatory Visit (INDEPENDENT_AMBULATORY_CARE_PROVIDER_SITE_OTHER): Payer: BLUE CROSS/BLUE SHIELD

## 2015-05-02 DIAGNOSIS — E890 Postprocedural hypothyroidism: Secondary | ICD-10-CM | POA: Diagnosis not present

## 2015-05-02 LAB — TSH: TSH: 0.25 u[IU]/mL — AB (ref 0.35–4.50)

## 2015-05-02 LAB — T4, FREE: FREE T4: 1.08 ng/dL (ref 0.60–1.60)

## 2015-05-06 ENCOUNTER — Ambulatory Visit: Payer: Self-pay | Admitting: Endocrinology

## 2015-05-09 ENCOUNTER — Ambulatory Visit (INDEPENDENT_AMBULATORY_CARE_PROVIDER_SITE_OTHER): Payer: BLUE CROSS/BLUE SHIELD | Admitting: Endocrinology

## 2015-05-09 ENCOUNTER — Encounter: Payer: Self-pay | Admitting: Endocrinology

## 2015-05-09 VITALS — BP 134/88 | HR 74 | Temp 98.4°F | Wt 233.1 lb

## 2015-05-09 DIAGNOSIS — E890 Postprocedural hypothyroidism: Secondary | ICD-10-CM | POA: Diagnosis not present

## 2015-05-09 NOTE — Patient Instructions (Signed)
Take 1 pill daily with 1/2 only on SUNDAYS

## 2015-05-09 NOTE — Progress Notes (Signed)
Patient ID: Danielle Khan, female   DOB: 1970/12/20, 44 y.o.   MRN: [id number]   Reason for Appointment:  Hypothyroidism, followup visit    History of Present Illness:   The patient was first seen in consultation in 03/2012 for management of her hyperthyroidism. She had had Graves' disease since the year 2001 and had been on long-term methimazole treatment but had not been on any dictations for 2 years prior to evaluation. Because of persistent hyperthyroidism and a free T4 level of 1.65 she was given 16.75 mCi of I-131 on 05/02/12  When finally seen for followup in 3/15 she was significantly hypothyroid with symptoms of feeling less cold, tired and  some hair shedding.  She initially given 150 mcg on her first visit    However because of relatively low TSH in 6/15  her dose was reduced to 125 mcg  Subsequently she had progressively higher TSH levels even with good compliance for medications in the morning daily Her dose was increased to 150 g in 3/16 and to 175 g in 6/16  She was however reporting taking a One-A-Day vitamin for women with her Synthroid in the morning and this was stopped in June  Does not now take any vitamins or calcium    She tends to have fatigue which she thinks has been improved with increasing her thyroid medication  TSH is now suppressed compared to previous level of 15 and free T4 is slightly high   Lab Results  Component Value Date   TSH 0.25* 05/02/2015   TSH 0.13* 01/11/2015   TSH 15.15* 11/23/2014   FREET4 1.08 05/02/2015   FREET4 1.70* 01/11/2015   FREET4 0.81 08/23/2014    Wt Readings from Last 3 Encounters:  05/09/15 233 lb 2 oz (105.745 kg)  02/03/15 232 lb (105.235 kg)  11/26/14 231 lb 6.4 oz (104.962 kg)       Medication List       This list is accurate as of: 05/09/15 11:43 AM.  Always use your most recent med list.               atenolol 50 MG tablet  Commonly known as:  TENORMIN  Take 1 tablet (50 mg total)  by mouth daily.     chlorthalidone 25 MG tablet  Commonly known as:  HYGROTON  Take 1 tablet (25 mg total) by mouth daily.     HYDROcodone-homatropine 5-1.5 MG/5ML syrup  Commonly known as:  HYCODAN  Take 5 mLs by mouth every 8 (eight) hours as needed for cough.     levothyroxine 150 MCG tablet  Commonly known as:  SYNTHROID, LEVOTHROID  Take 1 tablet (150 mcg total) by mouth daily.     triamcinolone 55 MCG/ACT Aero nasal inhaler  Commonly known as:  NASACORT AQ  Place 2 sprays into the nose daily.     triamcinolone cream 0.1 %  Commonly known as:  KENALOG  Apply 1 application topically 2 (two) times daily.        Allergies: No Known Allergies  Past Medical History  Diagnosis Date  . Hypertension   . Thyroid disease     Past Surgical History  Procedure Laterality Date  . Abdominal hysterectomy      No family history on file.  Social History:  reports that she has never smoked. Her smokeless tobacco use includes Snuff. She reports that she does not drink alcohol or use illicit drugs.  REVIEW Of SYSTEMS:  Weight history:  Wt Readings from Last 3 Encounters:  05/09/15 233 lb 2 oz (105.745 kg)  02/03/15 232 lb (105.235 kg)  11/26/14 231 lb 6.4 oz (104.962 kg)   Hypertension: She continues to be on atenolol and chlorthalidone. Has history of hypokalemia also, followed by PCP  She thinks she gets her blood pressure at work weekly and it is fairly good (<80 diastolic) and has had visits with her PCP    Examination:   BP 134/88 mmHg  Pulse 74  Temp(Src) 98.4 F (36.9 C)  Wt 233 lb 2 oz (105.745 kg)   She looks well NEUROLOGIC EXAM:  biceps reflexes show normal relaxation.  Skin appears normal    Assessment   Hypothyroidism, post ablative, now supplemented with 150 g  of levothyroxine Her Synthroid dose previously had been increased progressively over the last year She had required as much is 175 g but partly needed more because of interaction with her  vitamin Her dose has been adjusted recently and now with 150 g her TSH is still low normal at 0.25 She feels well subjectively    Treatment:  Levothyroxine 150 dose as before, start taking 6-1/2 tablets a week for now  She will take this in the morning before breakfast without any calcium or vitamins, she may take her blood pressure medications with this  Follow-up in 4 months   Kyung Muto 05/09/2015, 11:43 AM

## 2015-07-09 ENCOUNTER — Other Ambulatory Visit: Payer: Self-pay | Admitting: Family Medicine

## 2015-07-11 ENCOUNTER — Other Ambulatory Visit: Payer: Self-pay | Admitting: Family Medicine

## 2015-07-12 ENCOUNTER — Other Ambulatory Visit: Payer: Self-pay | Admitting: Physician Assistant

## 2015-08-20 ENCOUNTER — Ambulatory Visit (INDEPENDENT_AMBULATORY_CARE_PROVIDER_SITE_OTHER): Payer: BLUE CROSS/BLUE SHIELD | Admitting: Family Medicine

## 2015-08-20 VITALS — BP 140/80 | HR 67 | Temp 97.8°F | Resp 12 | Ht 64.0 in | Wt 240.0 lb

## 2015-08-20 DIAGNOSIS — I10 Essential (primary) hypertension: Secondary | ICD-10-CM

## 2015-08-20 DIAGNOSIS — M25511 Pain in right shoulder: Secondary | ICD-10-CM | POA: Diagnosis not present

## 2015-08-20 DIAGNOSIS — M79601 Pain in right arm: Secondary | ICD-10-CM | POA: Diagnosis not present

## 2015-08-20 DIAGNOSIS — M792 Neuralgia and neuritis, unspecified: Secondary | ICD-10-CM | POA: Diagnosis not present

## 2015-08-20 MED ORDER — PREDNISONE 20 MG PO TABS: ORAL_TABLET | ORAL | Status: DC

## 2015-08-20 MED ORDER — CYCLOBENZAPRINE HCL 10 MG PO TABS: 10.0000 mg | ORAL_TABLET | Freq: Every day | ORAL | Status: DC

## 2015-08-20 MED ORDER — CHLORTHALIDONE 25 MG PO TABS: 25.0000 mg | ORAL_TABLET | Freq: Every day | ORAL | Status: DC

## 2015-08-20 MED ORDER — ATENOLOL 50 MG PO TABS: 50.0000 mg | ORAL_TABLET | Freq: Every day | ORAL | Status: DC

## 2015-08-20 MED ORDER — NAPROXEN 500 MG PO TABS: 500.0000 mg | ORAL_TABLET | Freq: Two times a day (BID) | ORAL | Status: DC

## 2015-08-20 NOTE — Progress Notes (Signed)
Patient ID: Danielle Khan, female    DOB: 10/11/1970  Age: 45 y.o. MRN: [id number]  Chief Complaint  Patient presents with  . Arm Pain    right arm pain x 2 days, thinks she pulled something at work, off and on    Subjective:   Patient is been having pain down her right arm this week. She doesn't know specific injury. It started mostly on Wednesday. She hurts from the upper portion of the shoulder down to the lower arm. 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 She says it has gradually gotten worse. She did take some Aleve for it. She knows of no specific injury. Has not had pain like this before.  Current allergies, medications, problem list, past/family and social histories reviewed.  Objective:  BP 140/80 mmHg  Pulse 67  Temp(Src) 97.8 F (36.6 C) (Oral)  Resp 12  Ht 5\' 4"  (1.626 m)  Wt 240 lb (108.863 kg)  BMI 41.18 kg/m2  SpO2 98%  No major acute distress. Pleasant and alert, overweight lady. Range of motion of shoulder and elbow are good. Neck seems to have full range of motion without pain. On abduction of the arm she has some pain. There tenderness of the trapezius, mildly across the shoulder and biceps and elbow area.  Assessment & Plan:   Assessment: 1. Right shoulder pain   2. Right arm pain   3. Radicular pain in right arm   4. Essential hypertension       Plan: Radicular pain right shoulder and elbow and arm, etiology undetermined. Will treat as if she just strained or inflamed things. If worse will need to try to do further testing.  No orders of the defined types were placed in this encounter.    Meds ordered this encounter  Medications  . predniSONE (DELTASONE) 20 MG tablet    Sig: Take 3 daily for 2 days, then 2 daily for 2 days, then 1 daily for 2 days    Dispense:  12 tablet    Refill:  0  . cyclobenzaprine (FLEXERIL) 10 MG tablet    Sig: Take 1 tablet (10 mg total) by mouth at bedtime.    Dispense:  15 tablet    Refill:  0  . naproxen (NAPROSYN) 500 MG tablet    Sig: Take 1 tablet (500 mg total) by mouth 2 (two) times daily with a meal.    Dispense:  30 tablet    Refill:  0  . atenolol (TENORMIN) 50 MG tablet    Sig: Take 1 tablet (50 mg total) by mouth daily.    Dispense:  90 tablet    Refill:  2  . chlorthalidone (HYGROTON) 25 MG tablet    Sig: Take 1 tablet (25 mg total) by mouth daily.    Dispense:  90 tablet    Refill:  3   incidentally the patient was concern that she is about to run out of her blood pressure with and fluid medicines. I will represcribed him also.     Patient Instructions  Take prednisone 3 pills daily for 2 days, then 2 daily for 2 days, then 1 daily for 2 days. Best taken after a meal, preferably breakfast. This is for inflammation.  Take naproxen 500 mg 1 at breakfast and with supper for pain and inflammation  Take cyclobenzaprine 1 at bedtime for muscle relaxant  If symptoms continue to persist x-rays and/or physical therapy may be necessary.  When you're driving try to minimize use of the right arm  Do limbering exercises of the right shoulder as discussed     Return if symptoms worsen or fail to improve.   HOPPER,DAVID, MD 08/20/2015

## 2015-08-20 NOTE — Patient Instructions (Signed)
Take prednisone 3 pills daily for 2 days, then 2 daily for 2 days, then 1 daily for 2 days. Best taken after a meal, preferably breakfast. This is for inflammation.  Take naproxen 500 mg 1 at breakfast and with supper for pain and inflammation  Take cyclobenzaprine 1 at bedtime for muscle relaxant  If symptoms continue to persist x-rays and/or physical therapy may be necessary.  When you're driving try to minimize use of the right arm  Do limbering exercises of the right shoulder as discussed

## 2015-09-01 ENCOUNTER — Other Ambulatory Visit (INDEPENDENT_AMBULATORY_CARE_PROVIDER_SITE_OTHER): Payer: BLUE CROSS/BLUE SHIELD

## 2015-09-01 DIAGNOSIS — E890 Postprocedural hypothyroidism: Secondary | ICD-10-CM

## 2015-09-01 LAB — TSH: TSH: 1.95 u[IU]/mL (ref 0.35–4.50)

## 2015-09-01 LAB — T4, FREE: Free T4: 1.13 ng/dL (ref 0.60–1.60)

## 2015-09-06 ENCOUNTER — Encounter: Payer: Self-pay | Admitting: Endocrinology

## 2015-09-06 ENCOUNTER — Ambulatory Visit (INDEPENDENT_AMBULATORY_CARE_PROVIDER_SITE_OTHER): Payer: BLUE CROSS/BLUE SHIELD | Admitting: Endocrinology

## 2015-09-06 VITALS — BP 124/84 | HR 80 | Temp 98.0°F | Resp 14 | Ht 64.0 in | Wt 234.8 lb

## 2015-09-06 DIAGNOSIS — E890 Postprocedural hypothyroidism: Secondary | ICD-10-CM | POA: Diagnosis not present

## 2015-09-06 NOTE — Progress Notes (Signed)
Patient ID: Danielle MccallumCynthia Khan, female   DOB: 11/29/1970, 45 y.o.   MRN: [id number]   Reason for Appointment:  Hypothyroidism, followup visit    History of Present Illness:   The patient was first seen in consultation in 03/2012 for management of her hyperthyroidism. She had had Graves' disease since the year 2001 and had been on long-term methimazole treatment but had not been on any dictations for 2 years prior to evaluation. Because of persistent hyperthyroidism and a free T4 level of 1.65 she was given 16.75 mCi of I-131 on 05/02/12  When finally seen for followup in 3/15 she was significantly hypothyroid with symptoms of feeling less cold, tired and  some hair shedding.  She initially given 150 mcg on her first visit    However because of relatively low TSH in 6/15  her dose was reduced to 125 mcg  Subsequently she had progressively higher TSH levels even with good compliance for medications in the morning daily Her dose was increased to 150 g in 3/16 and to 175 g in 6/16 She was taking a One-A-Day vitamin for women with her Synthroid in the morning and this was stopped in June  Does not now take any vitamins or calcium     More recently she feels fairly good without any fatigue. Her weight is about the same    She is very compliant with her medication. TSH is quite normal now.    Lab Results  Component Value Date   TSH 1.95 09/01/2015   TSH 0.25* 05/02/2015   TSH 0.13* 01/11/2015   FREET4 1.13 09/01/2015   FREET4 1.08 05/02/2015   FREET4 1.70* 01/11/2015    Wt Readings from Last 3 Encounters:  09/06/15 234 lb 12.8 oz (106.505 kg)  08/20/15 240 lb (108.863 kg)  05/09/15 233 lb 2 oz (105.745 kg)       Medication List       This list is accurate as of: 09/06/15  9:23 PM.  Always use your most recent med list.               atenolol 50 MG tablet  Commonly known as:  TENORMIN  Take 1 tablet (50 mg total) by mouth daily.     chlorthalidone 25 MG  tablet  Commonly known as:  HYGROTON  Take 1 tablet (25 mg total) by mouth daily.     cyclobenzaprine 10 MG tablet  Commonly known as:  FLEXERIL  Take 1 tablet (10 mg total) by mouth at bedtime.     levothyroxine 150 MCG tablet  Commonly known as:  SYNTHROID, LEVOTHROID  Take 1 tablet (150 mcg total) by mouth daily.     naproxen 500 MG tablet  Commonly known as:  NAPROSYN  Take 1 tablet (500 mg total) by mouth 2 (two) times daily with a meal.     predniSONE 20 MG tablet  Commonly known as:  DELTASONE  Take 3 daily for 2 days, then 2 daily for 2 days, then 1 daily for 2 days     triamcinolone 55 MCG/ACT Aero nasal inhaler  Commonly known as:  NASACORT AQ  Place 2 sprays into the nose daily.     triamcinolone cream 0.1 %  Commonly known as:  KENALOG  Apply 1 application topically 2 (two) times daily.        Allergies: No Known Allergies  Past Medical History  Diagnosis Date  . Hypertension   . Thyroid disease  Past Surgical History  Procedure Laterality Date  . Abdominal hysterectomy      No family history on file.  Social History:  reports that she has never smoked. Her smokeless tobacco use includes Snuff. She reports that she does not drink alcohol or use illicit drugs.  REVIEW Of SYSTEMS:  Weight history:  Wt Readings from Last 3 Encounters:  09/06/15 234 lb 12.8 oz (106.505 kg)  08/20/15 240 lb (108.863 kg)  05/09/15 233 lb 2 oz (105.745 kg)   Hypertension: She continues to be on atenolol and chlorthalidone. Has history of hypokalemia also, followed by PCP       Examination:   BP 124/84 mmHg  Pulse 80  Temp(Src) 98 F (36.7 C)  Resp 14  Ht  (1.626 m)  Wt 234 lb 12.8 oz (106.505 kg)  BMI 40.28 kg/m2  SpO2 97%   She looks well  No thyroid enlargement felt  biceps reflexes show normal relaxation.  Skin appears normal    Assessment   Hypothyroidism, post ablative, now supplemented with 150 g  of levothyroxine, 6-1/2 tablets a  week  She feels well subjectively   TSH is now quite normal   Treatment:  Levothyroxine 150 dose as before,  6-1/2 tablets a week   She will take this in the morning before breakfast without any calcium or vitamins, she may take her blood pressure medications with this    follow-up in one year  [facility] 09/06/2015, 9:23 PM

## 2015-09-06 NOTE — Patient Instructions (Signed)
Same dose 

## 2016-01-07 ENCOUNTER — Other Ambulatory Visit: Payer: Self-pay | Admitting: Endocrinology

## 2016-01-16 ENCOUNTER — Other Ambulatory Visit: Payer: Self-pay | Admitting: Emergency Medicine

## 2016-01-16 DIAGNOSIS — Z1231 Encounter for screening mammogram for malignant neoplasm of breast: Secondary | ICD-10-CM

## 2016-01-30 ENCOUNTER — Other Ambulatory Visit: Payer: Self-pay

## 2016-01-30 MED ORDER — TRIAMCINOLONE ACETONIDE 0.1 % EX CREA: 1.0000 "application " | TOPICAL_CREAM | Freq: Two times a day (BID) | CUTANEOUS | 0 refills | Status: DC

## 2016-02-14 ENCOUNTER — Ambulatory Visit
Admission: RE | Admit: 2016-02-14 | Discharge: 2016-02-14 | Disposition: A | Discharge status: Home / Self Care | Payer: BLUE CROSS/BLUE SHIELD | Source: Ambulatory Visit | Attending: Emergency Medicine | Admitting: Emergency Medicine

## 2016-02-14 DIAGNOSIS — Z1231 Encounter for screening mammogram for malignant neoplasm of breast: Secondary | ICD-10-CM

## 2016-02-29 ENCOUNTER — Other Ambulatory Visit: Payer: Self-pay

## 2016-02-29 DIAGNOSIS — I10 Essential (primary) hypertension: Secondary | ICD-10-CM

## 2016-02-29 MED ORDER — ATENOLOL 100 MG PO TABS: 50.0000 mg | ORAL_TABLET | Freq: Every day | ORAL | 0 refills | Status: DC

## 2016-09-03 ENCOUNTER — Other Ambulatory Visit: Payer: Self-pay

## 2016-09-06 ENCOUNTER — Ambulatory Visit: Payer: Self-pay | Admitting: Endocrinology

## 2016-09-06 ENCOUNTER — Telehealth: Payer: Self-pay | Admitting: Endocrinology

## 2016-09-06 NOTE — Telephone Encounter (Signed)
Patient no showed today's appt. Please advise on how to follow up. °A. No follow up necessary. °B. Follow up urgent. Contact patient immediately. °C. Follow up necessary. Contact patient and schedule visit in ___ days. °D. Follow up advised. Contact patient and schedule visit in ____weeks. ° °

## 2016-09-08 NOTE — Telephone Encounter (Signed)
Follow up advised. Contact patient and schedule visit in 2 weeks. 

## 2016-09-09 ENCOUNTER — Other Ambulatory Visit: Payer: Self-pay | Admitting: Family Medicine

## 2016-09-09 DIAGNOSIS — I10 Essential (primary) hypertension: Secondary | ICD-10-CM

## 2016-09-10 NOTE — Telephone Encounter (Signed)
Left message to schedule visit before this supply is exhausted.  Meds ordered this encounter  Medications  . atenolol (TENORMIN) 50 MG tablet    Sig: TAKE ONE TABLET BY MOUTH ONCE DAILY    Dispense:  30 tablet    Refill:  0    Please notify patient that s/he needs an office visit +/- labsfor additional refills.

## 2016-09-11 NOTE — Telephone Encounter (Signed)
LM for pt to call back to schedule FU and LABS

## 2016-09-22 ENCOUNTER — Other Ambulatory Visit: Payer: Self-pay | Admitting: Endocrinology

## 2016-09-22 DIAGNOSIS — E890 Postprocedural hypothyroidism: Secondary | ICD-10-CM

## 2016-09-24 ENCOUNTER — Other Ambulatory Visit (INDEPENDENT_AMBULATORY_CARE_PROVIDER_SITE_OTHER): Payer: BLUE CROSS/BLUE SHIELD

## 2016-09-24 DIAGNOSIS — E890 Postprocedural hypothyroidism: Secondary | ICD-10-CM

## 2016-09-24 LAB — TSH: TSH: 3.29 u[IU]/mL (ref 0.35–4.50)

## 2016-09-24 LAB — T4, FREE: FREE T4: 1.2 ng/dL (ref 0.60–1.60)

## 2016-09-28 ENCOUNTER — Ambulatory Visit: Payer: Self-pay | Admitting: Endocrinology

## 2016-10-10 ENCOUNTER — Ambulatory Visit (INDEPENDENT_AMBULATORY_CARE_PROVIDER_SITE_OTHER): Payer: BLUE CROSS/BLUE SHIELD | Admitting: Endocrinology

## 2016-10-10 ENCOUNTER — Encounter: Payer: Self-pay | Admitting: Endocrinology

## 2016-10-10 VITALS — BP 148/98 | HR 80 | Ht 64.0 in | Wt 234.0 lb

## 2016-10-10 DIAGNOSIS — E890 Postprocedural hypothyroidism: Secondary | ICD-10-CM | POA: Diagnosis not present

## 2016-10-10 MED ORDER — LEVOTHYROXINE SODIUM 150 MCG PO TABS: 150.0000 ug | ORAL_TABLET | Freq: Every day | ORAL | 2 refills | Status: DC

## 2016-10-10 NOTE — Progress Notes (Signed)
Patient ID: Danielle Khan, female   DOB: [birth year], 46 y.o.   MRN: [id number]   Reason for Appointment:  Hypothyroidism, followup visit    History of Present Illness:   The patient was first seen in consultation in 03/2012 for management of her hyperthyroidism. She had had Graves' disease since the year 2001 and had been on long-term methimazole treatment but had not been on any dictations for 2 years prior to evaluation. Because of persistent hyperthyroidism and a free T4 level of 1.65 she was given 16.75 mCi of I-131 on 05/02/12  When finally seen for followup in 3/15 she was significantly hypothyroid with symptoms of feeling less cold, tired and  some hair shedding.  She initially given 150 mcg on her first visit    However because of relatively low TSH in 6/15  her dose was reduced to 125 mcg  Subsequently she had progressively higher TSH levels even with good compliance for medications in the morning daily Her dose was increased progressively  More recently she has been taking the 150 g levothyroxine dose, was told to take 6-1/2 tablets a week since 04/2015  However she forgets to do the half tablet on Sundays and is probably taking 1 tablet one Sunday and none the next  She is very compliant with her medication otherwise. Does not  take any vitamins or calcium    Currently she feels fairly good without any fatigue.  Her weight is about the same   TSH is normal but trending higher   Lab Results  Component Value Date   TSH 3.29 09/24/2016   TSH 1.95 09/01/2015   TSH 0.25 (L) 05/02/2015   FREET4 1.20 09/24/2016   FREET4 1.13 09/01/2015   FREET4 1.08 05/02/2015    Wt Readings from Last 3 Encounters:  10/10/16 234 lb (106.1 kg)  09/06/15 234 lb 12.8 oz (106.5 kg)  08/20/15 240 lb (108.9 kg)     Allergies as of 10/10/2016   No Known Allergies     Medication List       Accurate as of 10/10/16 10:31 AM. Always use your most recent med list.          atenolol 50 MG tablet Commonly known as:  TENORMIN TAKE ONE TABLET BY MOUTH ONCE DAILY   chlorthalidone 25 MG tablet Commonly known as:  HYGROTON Take 1 tablet (25 mg total) by mouth daily.   cyclobenzaprine 10 MG tablet Commonly known as:  FLEXERIL Take 1 tablet (10 mg total) by mouth at bedtime.   levothyroxine 150 MCG tablet Commonly known as:  SYNTHROID, LEVOTHROID Take 1 tablet (150 mcg total) by mouth daily.   naproxen 500 MG tablet Commonly known as:  NAPROSYN Take 1 tablet (500 mg total) by mouth 2 (two) times daily with a meal.   triamcinolone 55 MCG/ACT Aero nasal inhaler Commonly known as:  NASACORT AQ Place 2 sprays into the nose daily.   triamcinolone cream 0.1 % Commonly known as:  KENALOG Apply 1 application topically 2 (two) times daily.       Allergies: No Known Allergies  Past Medical History:  Diagnosis Date  . Hypertension   . Thyroid disease     Past Surgical History:  Procedure Laterality Date  . ABDOMINAL HYSTERECTOMY      No family history on file.  Social History:  reports that she has never smoked. Her smokeless tobacco use includes Snuff. She reports that she does not drink alcohol or use drugs.  REVIEW Of SYSTEMS:  Weight history:  Wt Readings from Last 3 Encounters:  10/10/16 234 lb (106.1 kg)  09/06/15 234 lb 12.8 oz (106.5 kg)  08/20/15 240 lb (108.9 kg)    Hypertension: She continues to be onTreatment with atenolol and chlorthalidone. Has history of hypokalemia also, has been followed by PCP  She is going to establish with a new PCP     Examination:   BP (!) 148/98   Pulse 80   Ht  (1.626 m)   Wt 234 lb (106.1 kg)   SpO2 97%   BMI 40.17 kg/m    She looks well  biceps reflexes show normal relaxation.  Skin appears normal    Assessment   Hypothyroidism, post ablative, now supplemented with 150 g  of levothyroxine, 6-1/2 tablets a week  She feels well with no fatigue   TSH is now tending  gradually higher although still normal   Treatment:  Levothyroxine 150 dose to be taken daily now as this will improve her TSH trend  She will follow-up with PCP for management of her hypertension which needs better control   follow-up in one year  [facility] 10/10/2016, 10:31 AM

## 2016-10-10 NOTE — Patient Instructions (Signed)
Take 1 pill daily   

## 2016-10-25 ENCOUNTER — Ambulatory Visit: Payer: BLUE CROSS/BLUE SHIELD | Admitting: Family Medicine

## 2016-10-31 ENCOUNTER — Ambulatory Visit: Payer: BLUE CROSS/BLUE SHIELD | Admitting: Family Medicine

## 2016-11-26 ENCOUNTER — Ambulatory Visit: Payer: BLUE CROSS/BLUE SHIELD | Admitting: Physician Assistant

## 2016-11-26 ENCOUNTER — Ambulatory Visit (INDEPENDENT_AMBULATORY_CARE_PROVIDER_SITE_OTHER): Payer: BLUE CROSS/BLUE SHIELD | Admitting: Physician Assistant

## 2016-11-26 ENCOUNTER — Encounter: Payer: Self-pay | Admitting: Physician Assistant

## 2016-11-26 VITALS — BP 144/90 | HR 75 | Temp 97.6°F | Resp 18 | Ht 64.0 in | Wt 228.6 lb

## 2016-11-26 DIAGNOSIS — I10 Essential (primary) hypertension: Secondary | ICD-10-CM | POA: Diagnosis not present

## 2016-11-26 DIAGNOSIS — L309 Dermatitis, unspecified: Secondary | ICD-10-CM | POA: Diagnosis not present

## 2016-11-26 MED ORDER — ATENOLOL 50 MG PO TABS: 50.0000 mg | ORAL_TABLET | Freq: Every day | ORAL | 3 refills | Status: DC

## 2016-11-26 MED ORDER — TRIAMCINOLONE ACETONIDE 0.1 % EX CREA: 1.0000 "application " | TOPICAL_CREAM | Freq: Two times a day (BID) | CUTANEOUS | 6 refills | Status: DC

## 2016-11-26 MED ORDER — CHLORTHALIDONE 25 MG PO TABS: 25.0000 mg | ORAL_TABLET | Freq: Every day | ORAL | 3 refills | Status: DC

## 2016-11-26 NOTE — Patient Instructions (Addendum)
Take 1000 mg of tylenol every 8 hours for every day aches and pains. Take Naprosyn, Ibuprofen, and Aspirin sparingly.     IF you received an x-ray today, you will receive an invoice from [facility]. Please contact [facility] at [phone] with questions or concerns regarding your invoice.   IF you received labwork today, you will receive an invoice from [facility]. Please contact [facility] at [phone] with questions or concerns regarding your invoice.   Our billing staff will not be able to assist you with questions regarding bills from these companies.  You will be contacted with the lab results as soon as they are available. The fastest way to get your results is to activate your My Chart account. Instructions are located on the last page of this paperwork. If you have not heard from us regarding the results in 2 weeks, please contact this office.

## 2016-11-26 NOTE — Progress Notes (Signed)
11/26/2016 1:25 PM   DOB: 1970/08/13 / MRN: [id number]  SUBJECTIVE:  Danielle Khan is a 46 y.o. female presenting for refills of BP medication and kenalog.  She is compliant with medical therapy and has no acute issues today.   She has taken atenolol and Hygotron in the past but has been out for three weeks now. Denies chest pain, leg swelling, SOB today.   She sees Dr. Lucianne Muss for hyperthyroidism and this was evaluated recently and normal per patient.   She has a history of chronic eczema about her neck and would like     She has No Known Allergies.   She  has a past medical history of Hypertension and Thyroid disease.    She  reports that she has never smoked. Her smokeless tobacco use includes Snuff. She reports that she does not drink alcohol or use drugs. She  reports that she currently engages in sexual activity. The patient  has a past surgical history that includes Abdominal hysterectomy.  Her family history is not on file.  Review of Systems  Constitutional: Negative for chills and fever.  Cardiovascular: Negative for chest pain.  Skin: Negative for itching and rash.    The problem list and medications were reviewed and updated by myself where necessary and exist elsewhere in the encounter.   OBJECTIVE:  BP 128/87   Pulse 75   Temp 97.6 F (36.4 C) (Oral)   Resp 18   Ht 5\' 4"  (1.626 m)   Wt 228 lb 9.6 oz (103.7 kg)   SpO2 94%   BMI 39.24 kg/m   Wt Readings from Last 3 Encounters:  11/26/16 228 lb 9.6 oz (103.7 kg)  10/10/16 234 lb (106.1 kg)  09/06/15 234 lb 12.8 oz (106.5 kg)   BP Readings from Last 3 Encounters:  11/26/16 128/87  10/10/16 (!) 148/98  09/06/15 124/84   Lab Results  Component Value Date   WBC 5.5 09/04/2013   HGB 11.1 (L) 09/04/2013   HCT 33.7 (L) 09/04/2013   MCV 89.2 09/04/2013   PLT 245 09/04/2013    Lab Results  Component Value Date   NA 138 09/04/2013   K 3.4 (L) 09/04/2013   CL 101 09/04/2013   CO2 30 09/04/2013     Lab Results  Component Value Date   CREATININE 1.12 (H) 09/04/2013    Lab Results  Component Value Date   ALT 8 09/04/2013   AST 16 09/04/2013   ALKPHOS 103 09/04/2013   BILITOT 0.5 09/04/2013    Lab Results  Component Value Date   TSH 3.29 09/24/2016    Lab Results  Component Value Date   CHOL 204 (H) 09/04/2013   HDL 43 09/04/2013   LDLCALC 133 (H) 09/04/2013   TRIG 142 09/04/2013   CHOLHDL 4.7 09/04/2013      Physical Exam  Constitutional: She is oriented to person, place, and time. She appears well-developed and well-nourished. She is active.  Non-toxic appearance.  Cardiovascular: Normal rate, regular rhythm, S1 normal, S2 normal, normal heart sounds and intact distal pulses.  Exam reveals no gallop, no friction rub and no decreased pulses.   No murmur heard. Pulmonary/Chest: Effort normal and breath sounds normal. No stridor. No tachypnea. No respiratory distress. She has no wheezes. She has no rales.  Abdominal: She exhibits no distension.  Musculoskeletal: Normal range of motion. She exhibits no edema or tenderness.  Neurological: She is alert and oriented to person, place, and time. She has normal  reflexes.  Skin: Skin is warm and dry. She is not diaphoretic. No pallor.  Vitals reviewed.     No results found for this or any previous visit (from the past 72 hour(s)).  No results found.  ASSESSMENT AND PLAN:  Aram BeechamCynthia was seen today for follow-up and medication refill.  Diagnoses and all orders for this visit:  Essential hypertension: Meds refilled today.  Screening labs.  Sees Endo for hypothyroidism s/p post Graves therapy.  -     Hemoglobin A1c -     Basic metabolic panel -     CBC -     atenolol (TENORMIN) 50 MG tablet; Take 1 tablet (50 mg total) by mouth daily. -     chlorthalidone (HYGROTON) 25 MG tablet; Take 1 tablet (25 mg total) by mouth daily.  Eczema, unspecified type -     triamcinolone cream (KENALOG) 0.1 %; Apply 1 application  topically 2 (two) times daily.    The patient is advised to call or return to clinic if she does not see an improvement in symptoms, or to seek the care of the closest emergency department if she worsens with the above plan.   Deliah BostonMichael Clotilda Hafer, MHS, PA-C [facility] at [facility] 11/26/2016 1:25 PM

## 2016-11-27 ENCOUNTER — Telehealth: Payer: Self-pay

## 2016-11-27 LAB — CBC
Hematocrit: 38.2 % (ref 34.0–46.6)
Hemoglobin: 12.1 g/dL (ref 11.1–15.9)
MCH: 28.9 pg (ref 26.6–33.0)
MCHC: 31.7 g/dL (ref 31.5–35.7)
MCV: 91 fL (ref 79–97)
PLATELETS: 307 10*3/uL (ref 150–379)
RBC: 4.19 x10E6/uL (ref 3.77–5.28)
RDW: 14.1 % (ref 12.3–15.4)
WBC: 7.9 10*3/uL (ref 3.4–10.8)

## 2016-11-27 LAB — HEMOGLOBIN A1C
ESTIMATED AVERAGE GLUCOSE: 126 mg/dL
HEMOGLOBIN A1C: 6 % — AB (ref 4.8–5.6)

## 2016-11-27 LAB — BASIC METABOLIC PANEL
BUN / CREAT RATIO: 10 (ref 9–23)
BUN: 9 mg/dL (ref 6–24)
CALCIUM: 9 mg/dL (ref 8.7–10.2)
CO2: 26 mmol/L (ref 20–29)
Chloride: 102 mmol/L (ref 96–106)
Creatinine, Ser: 0.91 mg/dL (ref 0.57–1.00)
GFR calc non Af Amer: 75 mL/min/{1.73_m2} (ref >59)
GFR, EST AFRICAN AMERICAN: 87 mL/min/{1.73_m2} (ref >59)
GLUCOSE: 96 mg/dL (ref 65–99)
POTASSIUM: 4 mmol/L (ref 3.5–5.2)
Sodium: 143 mmol/L (ref 134–144)

## 2016-11-27 NOTE — Telephone Encounter (Signed)
-----   Message from Ofilia NeasMichael L Clark, PA-C sent at 11/27/2016  1:45 PM EDT ----- Patient is a prediabetic.  Advise 20-30 minutes of daily walking, decrease sugar consumption, and follow up in 6 months for recheck. Deliah BostonMichael Clark, MS, PA-C 1:45 PM, 11/27/2016

## 2016-11-27 NOTE — Telephone Encounter (Signed)
Patient returned call to appointment desk, [facility]

## 2016-11-27 NOTE — Telephone Encounter (Signed)
Call placed to patient per provider notes. No answer on patient phone, left message for patient to return call to this office. [facility]

## 2017-03-04 ENCOUNTER — Encounter: Payer: BLUE CROSS/BLUE SHIELD | Admitting: Physician Assistant

## 2017-04-10 ENCOUNTER — Other Ambulatory Visit: Payer: Self-pay | Admitting: Physician Assistant

## 2017-04-10 DIAGNOSIS — Z1231 Encounter for screening mammogram for malignant neoplasm of breast: Secondary | ICD-10-CM

## 2017-05-07 ENCOUNTER — Ambulatory Visit
Admission: RE | Admit: 2017-05-07 | Discharge: 2017-05-07 | Disposition: A | Discharge status: Home / Self Care | Payer: BLUE CROSS/BLUE SHIELD | Source: Ambulatory Visit | Attending: Physician Assistant | Admitting: Physician Assistant

## 2017-05-07 DIAGNOSIS — Z1231 Encounter for screening mammogram for malignant neoplasm of breast: Secondary | ICD-10-CM

## 2017-06-03 ENCOUNTER — Ambulatory Visit: Payer: BLUE CROSS/BLUE SHIELD | Admitting: Physician Assistant

## 2017-07-13 ENCOUNTER — Encounter: Payer: Self-pay | Admitting: Physician Assistant

## 2017-07-13 ENCOUNTER — Ambulatory Visit (INDEPENDENT_AMBULATORY_CARE_PROVIDER_SITE_OTHER): Payer: BLUE CROSS/BLUE SHIELD | Admitting: Physician Assistant

## 2017-07-13 VITALS — BP 156/100 | HR 77 | Temp 98.1°F | Wt 231.0 lb

## 2017-07-13 DIAGNOSIS — I10 Essential (primary) hypertension: Secondary | ICD-10-CM

## 2017-07-13 DIAGNOSIS — R059 Cough, unspecified: Secondary | ICD-10-CM

## 2017-07-13 DIAGNOSIS — R05 Cough: Secondary | ICD-10-CM | POA: Diagnosis not present

## 2017-07-13 DIAGNOSIS — J40 Bronchitis, not specified as acute or chronic: Secondary | ICD-10-CM | POA: Diagnosis not present

## 2017-07-13 MED ORDER — HYDROCODONE-HOMATROPINE 5-1.5 MG/5ML PO SYRP: 5.0000 mL | ORAL_SOLUTION | Freq: Three times a day (TID) | ORAL | 0 refills | Status: DC | PRN

## 2017-07-13 MED ORDER — AZITHROMYCIN 250 MG PO TABS: ORAL_TABLET | ORAL | 0 refills | Status: DC

## 2017-07-13 MED ORDER — BENZONATATE 100 MG PO CAPS: 100.0000 mg | ORAL_CAPSULE | Freq: Three times a day (TID) | ORAL | 0 refills | Status: DC | PRN

## 2017-07-13 NOTE — Patient Instructions (Addendum)
In terms of elevated blood pressure, stop using over the counter medication with DM in it. Also avoid using excess salt. I would like you to check your blood pressure at least a couple times over the next week outside of the office and document these values. It is best if you check the blood pressure at different times in the day. Your goal is <140/90. If your values are consistently above this goal, please return to office for further evaluation. If you start to have chest pain, blurred vision, shortness of breath, severe headache, lower leg swelling, or nausea/vomiting please seek care immediately here or at the ED.   - We will treat your cough both symptomatically and for underlying bacterial etiology with antibiotic. - I recommend you rest, drink plenty of fluids, eat light meals including soups.  - You may use cough syrup at night for your cough and sore throat, Tessalon pearls during the day or mucinex without dm during the day.Marland Kitchen Be aware that cough syrup can definitely make you drowsy and sleepy so do not drive or operate any heavy machinery if it is affecting you during the day.  - Please let me know if you are not seeing any improvement or get worse in 7-10 days.      Acute Bronchitis, Adult Acute bronchitis is when air tubes (bronchi) in the lungs suddenly get swollen. The condition can make it hard to breathe. It can also cause these symptoms:  A cough.  Coughing up clear, yellow, or green mucus.  Wheezing.  Chest congestion.  Shortness of breath.  A fever.  Body aches.  Chills.  A sore throat.  Follow these instructions at home: Medicines  Take over-the-counter and prescription medicines only as told by your doctor.  If you were prescribed an antibiotic medicine, take it as told by your doctor. Do not stop taking the antibiotic even if you start to feel better. General instructions  Rest.  Drink enough fluids to keep your pee (urine) clear or pale  yellow.  Avoid smoking and secondhand smoke. If you smoke and you need help quitting, ask your doctor. Quitting will help your lungs heal faster.  Use an inhaler, cool mist vaporizer, or humidifier as told by your doctor.  Keep all follow-up visits as told by your doctor. This is important. How is this prevented? To lower your risk of getting this condition again:  Wash your hands often with soap and water. If you cannot use soap and water, use hand sanitizer.  Avoid contact with people who have cold symptoms.  Try not to touch your hands to your mouth, nose, or eyes.  Make sure to get the flu shot every year.  Contact a doctor if:  Your symptoms do not get better in 2 weeks. Get help right away if:  You cough up blood.  You have chest pain.  You have very bad shortness of breath.  You become dehydrated.  You faint (pass out) or keep feeling like you are going to pass out.  You keep throwing up (vomiting).  You have a very bad headache.  Your fever or chills gets worse. This information is not intended to replace advice given to you by your health care provider. Make sure you discuss any questions you have with your health care provider. Document Released: 11/21/2007 Document Revised: 01/11/2016 Document Reviewed: 11/23/2015 Elsevier Interactive Patient Education  2018 [employer].  Hypertension Hypertension, commonly called high blood pressure, is when the force of blood pumping  through the arteries is too strong. The arteries are the blood vessels that carry blood from the heart throughout the body. Hypertension forces the heart to work harder to pump blood and may cause arteries to become narrow or stiff. Having untreated or uncontrolled hypertension can cause heart attacks, strokes, kidney disease, and other problems. A blood pressure reading consists of a higher number over a lower number. Ideally, your blood pressure should be below 120/80. The first ("top") number  is called the systolic pressure. It is a measure of the pressure in your arteries as your heart beats. The second ("bottom") number is called the diastolic pressure. It is a measure of the pressure in your arteries as the heart relaxes. What are the causes? The cause of this condition is not known. What increases the risk? Some risk factors for high blood pressure are under your control. Others are not. Factors you can change  Smoking.  Having type 2 diabetes mellitus, high cholesterol, or both.  Not getting enough exercise or physical activity.  Being overweight.  Having too much fat, sugar, calories, or salt (sodium) in your diet.  Drinking too much alcohol. Factors that are difficult or impossible to change  Having chronic kidney disease.  Having a family history of high blood pressure.  Age. Risk increases with age.  Race. You may be at higher risk if you are African-American.  Gender. Men are at higher risk than women before age 18. After age 49, women are at higher risk than men.  Having obstructive sleep apnea.  Stress. What are the signs or symptoms? Extremely high blood pressure (hypertensive crisis) may cause:  Headache.  Anxiety.  Shortness of breath.  Nosebleed.  Nausea and vomiting.  Severe chest pain.  Jerky movements you cannot control (seizures).  How is this diagnosed? This condition is diagnosed by measuring your blood pressure while you are seated, with your arm resting on a surface. The cuff of the blood pressure monitor will be placed directly against the skin of your upper arm at the level of your heart. It should be measured at least twice using the same arm. Certain conditions can cause a difference in blood pressure between your right and left arms. Certain factors can cause blood pressure readings to be lower or higher than normal (elevated) for a short period of time:  When your blood pressure is higher when you are in a health care  provider's office than when you are at home, this is called white coat hypertension. Most people with this condition do not need medicines.  When your blood pressure is higher at home than when you are in a health care provider's office, this is called masked hypertension. Most people with this condition may need medicines to control blood pressure.  If you have a high blood pressure reading during one visit or you have normal blood pressure with other risk factors:  You may be asked to return on a different day to have your blood pressure checked again.  You may be asked to monitor your blood pressure at home for 1 week or longer.  If you are diagnosed with hypertension, you may have other blood or imaging tests to help your health care provider understand your overall risk for other conditions. How is this treated? This condition is treated by making healthy lifestyle changes, such as eating healthy foods, exercising more, and reducing your alcohol intake. Your health care provider may prescribe medicine if lifestyle changes are not enough to  get your blood pressure under control, and if:  Your systolic blood pressure is above 130.  Your diastolic blood pressure is above 80.  Your personal target blood pressure may vary depending on your medical conditions, your age, and other factors. Follow these instructions at home: Eating and drinking  Eat a diet that is high in fiber and potassium, and low in sodium, added sugar, and fat. An example eating plan is called the DASH (Dietary Approaches to Stop Hypertension) diet. To eat this way: ? Eat plenty of fresh fruits and vegetables. Try to fill half of your plate at each meal with fruits and vegetables. ? Eat whole grains, such as whole wheat pasta, brown rice, or whole grain bread. Fill about one quarter of your plate with whole grains. ? Eat or drink low-fat dairy products, such as skim milk or low-fat yogurt. ? Avoid fatty cuts of meat,  processed or cured meats, and poultry with skin. Fill about one quarter of your plate with lean proteins, such as fish, chicken without skin, beans, eggs, and tofu. ? Avoid premade and processed foods. These tend to be higher in sodium, added sugar, and fat.  Reduce your daily sodium intake. Most people with hypertension should eat less than 1,500 mg of sodium a day.  Limit alcohol intake to no more than 1 drink a day for nonpregnant women and 2 drinks a day for men. One drink equals 12 oz of beer, 5 oz of wine, or 1 oz of hard liquor. Lifestyle  Work with your health care provider to maintain a healthy body weight or to lose weight. Ask what an ideal weight is for you.  Get at least 30 minutes of exercise that causes your heart to beat faster (aerobic exercise) most days of the week. Activities may include walking, swimming, or biking.  Include exercise to strengthen your muscles (resistance exercise), such as pilates or lifting weights, as part of your weekly exercise routine. Try to do these types of exercises for 30 minutes at least 3 days a week.  Do not use any products that contain nicotine or tobacco, such as cigarettes and e-cigarettes. If you need help quitting, ask your health care provider.  Monitor your blood pressure at home as told by your health care provider.  Keep all follow-up visits as told by your health care provider. This is important. Medicines  Take over-the-counter and prescription medicines only as told by your health care provider. Follow directions carefully. Blood pressure medicines must be taken as prescribed.  Do not skip doses of blood pressure medicine. Doing this puts you at risk for problems and can make the medicine less effective.  Ask your health care provider about side effects or reactions to medicines that you should watch for. Contact a health care provider if:  You think you are having a reaction to a medicine you are taking.  You have  headaches that keep coming back (recurring).  You feel dizzy.  You have swelling in your ankles.  You have trouble with your vision. Get help right away if:  You develop a severe headache or confusion.  You have unusual weakness or numbness.  You feel faint.  You have severe pain in your chest or abdomen.  You vomit repeatedly.  You have trouble breathing. Summary  Hypertension is when the force of blood pumping through your arteries is too strong. If this condition is not controlled, it may put you at risk for serious complications.  Your personal  target blood pressure may vary depending on your medical conditions, your age, and other factors. For most people, a normal blood pressure is less than 120/80.  Hypertension is treated with lifestyle changes, medicines, or a combination of both. Lifestyle changes include weight loss, eating a healthy, low-sodium diet, exercising more, and limiting alcohol. This information is not intended to replace advice given to you by your health care provider. Make sure you discuss any questions you have with your health care provider. Document Released: 06/04/2005 Document Revised: 05/02/2016 Document Reviewed: 05/02/2016 Elsevier Interactive Patient Education  2018 [employer]   IF you received an x-ray today, you will receive an invoice from [facility]. Please contact [facility] at [phone] with questions or concerns regarding your invoice.   IF you received labwork today, you will receive an invoice from [facility]. Please contact [facility] at [phone] with questions or concerns regarding your invoice.   Our billing staff will not be able to assist you with questions regarding bills from these companies.  You will be contacted with the lab results as soon as they are available. The fastest way to get your results is to activate your My Chart account. Instructions are located on the last page of this paperwork. If  you have not heard from us regarding the results in 2 weeks, please contact this office.

## 2017-07-13 NOTE — Progress Notes (Addendum)
MRN: [id number] DOB: 09/30/1970  Subjective:   Danielle MccallumCynthia Khan is a 47 y.o. female presenting for chief complaint of Cough (Has taken several over the counter medications x 1 month) and Chills .  Reports 4 week history of illness. Started out with runny eyes and congestion. Then developed a dry hacking cough. Will occasionally have phlegm production.. Has tried OTC mucinex-dm and robitussin-dm daily with no full relief. Denies fever, eye itching, photophobia, foreign body sensation, visual disturbance,  sinus pain, ear pain, sore throat, wheezing, shortness of breath and chest pain, nausea, vomiting, abdominal pain and diarrhea. Has had sick contact with grandchildren. No history of seasonal allergies, no history of asthma or COPD. Patient has not had flu shot this season. Deneis smoking.   In terms of hypertension, patient is taking atenolol 50 mg and chlorthalidone 25 mg daily.  Last followed up in the office on 11/26/16 with PA Clark.Patient was controlled at 128/87.  Patient does not regularly check her blood pressure outside the office.  She is supposed to follow-up for a complete physical exam but has not scheduled this yet.  She denies chest pain, shortness of breath, visual disturbance, chronic headache, heart palpitations, lower leg swelling, hematuria, and dizziness.  Review of Systems  Per HPI  Aram BeechamCynthia has a current medication list which includes the following prescription(s): atenolol, chlorthalidone, cyclobenzaprine, levothyroxine, triamcinolone, triamcinolone cream, azithromycin, benzonatate, and hydrocodone-homatropine. Also has No Known Allergies.  Aram BeechamCynthia  has a past medical history of Hypertension and Thyroid disease. Also  has a past surgical history that includes Abdominal hysterectomy.   Objective:   Vitals: BP (!) 156/100   Pulse 77   Temp 98.1 F (36.7 C) (Oral)   Wt 231 lb (104.8 kg)   BMI 39.65 kg/m    Physical Exam  Constitutional: She is oriented to  person, place, and time. She appears well-developed and well-nourished.  Coughing occasionally during exam.  HENT:  Head: Normocephalic and atraumatic.  Right Ear: Tympanic membrane, external ear and ear canal normal.  Left Ear: Tympanic membrane, external ear and ear canal normal.  Nose: Rhinorrhea present. Right sinus exhibits no maxillary sinus tenderness and no frontal sinus tenderness. Left sinus exhibits no maxillary sinus tenderness and no frontal sinus tenderness.  Mouth/Throat: Uvula is midline, oropharynx is clear and moist and mucous membranes are normal. No tonsillar exudate.  Eyes: Conjunctivae and EOM are normal. Right eye exhibits discharge (mild watery discharge noted ). Left eye exhibits discharge ( mild watery discharge noted ).  Neck: Normal range of motion.  Cardiovascular: Normal rate, regular rhythm, normal heart sounds and intact distal pulses.  Pulmonary/Chest: Effort normal and breath sounds normal. No respiratory distress. She has no decreased breath sounds. She has no wheezes. She has no rhonchi. She has no rales.  Musculoskeletal:       Right lower leg: She exhibits no swelling.       Left lower leg: She exhibits no swelling.  Lymphadenopathy:       Head (right side): No submental, no submandibular, no tonsillar, no preauricular, no posterior auricular and no occipital adenopathy present.       Head (left side): No submental, no submandibular, no tonsillar, no preauricular, no posterior auricular and no occipital adenopathy present.    She has no cervical adenopathy.       Right: No supraclavicular adenopathy present.       Left: No supraclavicular adenopathy present.  Neurological: She is alert and oriented to person, place, and  time.  Skin: Skin is warm and dry.  Psychiatric: She has a normal mood and affect.  Vitals reviewed.   No results found for this or any previous visit (from the past 24 hour(s)).   BP Readings from Last 3 Encounters:  07/13/17 (!)  156/100  11/26/16 (!) 144/90  10/10/16 (!) 148/98     Assessment and Plan :  1. Cough 2. Bronchitis Pt is overall well appearring, vitals stable. Lungs CTAB. Due to duration of cough, will treat for underlying bacterial etiology at this time. Advised to return to clinic if symptoms worsen, do not improve, or as needed. - azithromycin (ZITHROMAX) 250 MG tablet; Take 2 tabs PO x 1 dose, then 1 tab PO QD x 4 days  Dispense: 6 tablet; Refill: 0 - benzonatate (TESSALON) 100 MG capsule; Take 1-2 capsules (100-200 mg total) by mouth 3 (three) times daily as needed for cough.  Dispense: 40 capsule; Refill: 0 - HYDROcodone-homatropine (HYCODAN) 5-1.5 MG/5ML syrup; Take 5 mLs by mouth every 8 (eight) hours as needed for cough.  Dispense: 120 mL; Refill: 0  3. Essential hypertension Uncontrolled at this time. Asymptomatic. Likely due to excess OTC cough/cold medication.  Instructed to check bp outside of office over the next couple of weeks. Return if consistently >140/90.  Also encouraged to return for CPE.  Given strict ED precautions.    Benjiman Core, PA-C  [facility] at [facility] 07/13/2017 2:03 PM

## 2017-08-05 ENCOUNTER — Ambulatory Visit: Payer: BLUE CROSS/BLUE SHIELD | Admitting: Physician Assistant

## 2017-09-08 ENCOUNTER — Other Ambulatory Visit: Payer: Self-pay | Admitting: Endocrinology

## 2017-10-07 ENCOUNTER — Other Ambulatory Visit: Payer: Self-pay

## 2017-10-10 ENCOUNTER — Ambulatory Visit: Payer: Self-pay | Admitting: Endocrinology

## 2017-11-29 ENCOUNTER — Other Ambulatory Visit: Payer: Self-pay | Admitting: Physician Assistant

## 2017-11-29 DIAGNOSIS — I10 Essential (primary) hypertension: Secondary | ICD-10-CM

## 2017-12-02 NOTE — Telephone Encounter (Signed)
atenolol refill Last Refill:11/26/16 # 90 3 RF Last OV: 07/13/17 PCP: Deliah BostonMichael Clark PA [facility] [address]

## 2018-06-03 ENCOUNTER — Other Ambulatory Visit: Payer: Self-pay | Admitting: Physician Assistant

## 2018-06-03 DIAGNOSIS — Z1231 Encounter for screening mammogram for malignant neoplasm of breast: Secondary | ICD-10-CM

## 2018-06-09 ENCOUNTER — Other Ambulatory Visit: Payer: Self-pay

## 2018-06-09 ENCOUNTER — Encounter: Payer: Self-pay | Admitting: Family Medicine

## 2018-06-09 ENCOUNTER — Ambulatory Visit: Payer: BLUE CROSS/BLUE SHIELD | Admitting: Family Medicine

## 2018-06-09 VITALS — BP 157/90 | HR 85 | Temp 98.8°F | Resp 16 | Ht 66.0 in | Wt 238.0 lb

## 2018-06-09 DIAGNOSIS — J309 Allergic rhinitis, unspecified: Secondary | ICD-10-CM | POA: Diagnosis not present

## 2018-06-09 DIAGNOSIS — Z23 Encounter for immunization: Secondary | ICD-10-CM

## 2018-06-09 DIAGNOSIS — R05 Cough: Secondary | ICD-10-CM

## 2018-06-09 DIAGNOSIS — R058 Other specified cough: Secondary | ICD-10-CM

## 2018-06-09 MED ORDER — BENZONATATE 100 MG PO CAPS: 100.0000 mg | ORAL_CAPSULE | Freq: Three times a day (TID) | ORAL | 0 refills | Status: DC | PRN

## 2018-06-09 MED ORDER — PREDNISONE 20 MG PO TABS: ORAL_TABLET | ORAL | 0 refills | Status: DC

## 2018-06-09 MED ORDER — TRIAMCINOLONE ACETONIDE 55 MCG/ACT NA AERO: 2.0000 | INHALATION_SPRAY | Freq: Every day | NASAL | 5 refills | Status: AC | PRN

## 2018-06-09 MED ORDER — AZITHROMYCIN 250 MG PO TABS: ORAL_TABLET | ORAL | 0 refills | Status: DC

## 2018-06-09 MED ORDER — HYDROCODONE-HOMATROPINE 5-1.5 MG/5ML PO SYRP: 5.0000 mL | ORAL_SOLUTION | ORAL | 0 refills | Status: DC | PRN

## 2018-06-09 NOTE — Patient Instructions (Addendum)
Drink plenty of fluids and get enough rest  Take the benzonatate cough pills 1 or 2 pills 3 times daily if needed when working for cough relief  Take the Hycodan cough syrup 1 teaspoon every 4-6 hours as needed for nighttime cough  Take the azithromycin (Z-Pak) 2 pills initially, then 1 daily for 4 days for infection  Take the prednisone 3 pills daily for 2 days, then 2 daily for 2 days, then 1 daily for 2 days best taken after breakfast for the inflammation in your lungs.  Represcribed your triamcinolone nose spray for allergies.  Return if needed.  If you are not doing better in the next 3 weeks or so you would probably need to have a chest x-ray done.    If you have lab work done today you will be contacted with your lab results within the next 2 weeks.  If you have not heard from us then please contact us. The fastest way to get your results is to register for My Chart.   IF you received an x-ray today, you will receive an invoice from [facility]. Please contact [facility] at [phone] with questions or concerns regarding your invoice.   IF you received labwork today, you will receive an invoice from [facility]. Please contact [facility] at [phone] with questions or concerns regarding your invoice.   Our billing staff will not be able to assist you with questions regarding bills from these companies.  You will be contacted with the lab results as soon as they are available. The fastest way to get your results is to activate your My Chart account. Instructions are located on the last page of this paperwork. If you have not heard from us regarding the results in 2 weeks, please contact this office.

## 2018-06-09 NOTE — Addendum Note (Signed)
Addended by: HOPPER, DAVID H on: 06/09/2018 05:57 PM   Modules accepted: Orders

## 2018-06-09 NOTE — Progress Notes (Addendum)
Patient ID: Satira MccallumCynthia Tillman, female    DOB: 05/14/1971  Age: 47 y.o. MRN: [id number]  Chief Complaint  Patient presents with  . Cough    been coughing for 3-4 weeks chest and ribs sore from all the coughing, sometime mucus come up, OTC med has not been helping    Subjective:   47 year old lady with a 3 to 4-week history of a cough.  She coughs so much the chest wall is sore.  She is coughing up very little stuff, primarily a clear mucus.  She may wheeze a little at times.  She has only had minimal nasal congestion.  Ears have been a little stuffy.  Throat a little sore from the coughing.  Not running fevers.  Kids and grandkids in the home of been sick.  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  Current allergies, medications, problem list, past/family and social histories reviewed.  Objective:  BP (!) 157/90 (BP Location: Right Arm, Patient Position: Sitting, Cuff Size: Large)   Pulse 85   Temp 98.8 F (37.1 C) (Oral)   Resp 16   Ht 5\' 6"  (1.676 m)   Wt 238 lb (108 kg)   SpO2 97%   BMI 38.41 kg/m   Constantly coughing.  Alert and oriented.  Does not look real ill.  Her TMs are normal.  Throat not erythematous.  Neck supple without significant nodes.  Chest is clear to auscultation but has somewhat decreased sounding air movement.  Heart regular without murmurs.  No rales or rhonchi were noted.  No wheezing noted at this time.  Assessment & Plan:   Assessment: 1. Post-viral cough syndrome   2. Allergic rhinitis, unspecified seasonality, unspecified trigger       Plan: See instructions  No orders of the defined types were placed in this encounter.  She decided she would get a flu shot today.----- then she changed her mind and decided to wait and try and get it at her job place.  Try to talk her into it. Meds ordered this encounter  Medications  . triamcinolone (NASACORT AQ) 55 MCG/ACT AERO nasal inhaler    Sig: Place 2 sprays into the nose daily  as needed.    Dispense:  1 Inhaler    Refill:  5  . azithromycin (ZITHROMAX) 250 MG tablet    Sig: Take 2 tabs PO x 1 dose, then 1 tab PO QD x 4 days    Dispense:  6 tablet    Refill:  0  . predniSONE (DELTASONE) 20 MG tablet    Sig: Take 3 each morning for 2 days, then 2 daily for 2 days, then 1 daily for 2 days    Dispense:  12 tablet    Refill:  0  . HYDROcodone-homatropine (HYCODAN) 5-1.5 MG/5ML syrup    Sig: Take 5 mLs by mouth every 4 (four) hours as needed.    Dispense:  60 mL    Refill:  0  . benzonatate (TESSALON) 100 MG capsule    Sig: Take 1-2 capsules (100-200 mg total) by mouth 3 (three) times daily as needed for cough.    Dispense:  40 capsule    Refill:  0         Patient Instructions   Drink plenty of fluids and get enough rest  Take the benzonatate cough pills 1 or 2 pills 3 times daily if needed when working for cough relief  Take the Hycodan cough syrup 1 teaspoon every  4-6 hours as needed for nighttime cough  Take the azithromycin (Z-Pak) 2 pills initially, then 1 daily for 4 days for infection  Take the prednisone 3 pills daily for 2 days, then 2 daily for 2 days, then 1 daily for 2 days best taken after breakfast for the inflammation in your lungs.  Represcribed your triamcinolone nose spray for allergies.  Return if needed.  If you are not doing better in the next 3 weeks or so you would probably need to have a chest x-ray done.    If you have lab work done today you will be contacted with your lab results within the next 2 weeks.  If you have not heard from us then please contact us. The fastest way to get your results is to register for My Chart.   IF you received an x-ray today, you will receive an invoice from [facility]. Please contact [facility] at [phone] with questions or concerns regarding your invoice.   IF you received labwork today, you will receive an invoice from [facility]. Please contact [facility] at  [phone] with questions or concerns regarding your invoice.   Our billing staff will not be able to assist you with questions regarding bills from these companies.  You will be contacted with the lab results as soon as they are available. The fastest way to get your results is to activate your My Chart account. Instructions are located on the last page of this paperwork. If you have not heard from us regarding the results in 2 weeks, please contact this office.         Return if symptoms worsen or fail to improve.   Janace Hoardavid Abigial Newville, MD 06/09/2018

## 2018-07-01 ENCOUNTER — Ambulatory Visit: Payer: Self-pay

## 2018-07-31 ENCOUNTER — Other Ambulatory Visit: Payer: Self-pay | Admitting: Physician Assistant

## 2018-07-31 DIAGNOSIS — I10 Essential (primary) hypertension: Secondary | ICD-10-CM

## 2018-08-01 ENCOUNTER — Ambulatory Visit
Admission: RE | Admit: 2018-08-01 | Discharge: 2018-08-01 | Disposition: A | Discharge status: Home / Self Care | Payer: BLUE CROSS/BLUE SHIELD | Source: Ambulatory Visit | Attending: Physician Assistant | Admitting: Physician Assistant

## 2018-08-01 DIAGNOSIS — Z1231 Encounter for screening mammogram for malignant neoplasm of breast: Secondary | ICD-10-CM

## 2018-09-09 ENCOUNTER — Other Ambulatory Visit: Payer: Self-pay | Admitting: Family Medicine

## 2018-09-09 DIAGNOSIS — I10 Essential (primary) hypertension: Secondary | ICD-10-CM

## 2018-09-14 ENCOUNTER — Other Ambulatory Visit: Payer: Self-pay | Admitting: Family Medicine

## 2018-09-14 DIAGNOSIS — I10 Essential (primary) hypertension: Secondary | ICD-10-CM

## 2018-09-16 ENCOUNTER — Other Ambulatory Visit: Payer: Self-pay | Admitting: Family Medicine

## 2018-09-16 DIAGNOSIS — I10 Essential (primary) hypertension: Secondary | ICD-10-CM

## 2018-10-03 ENCOUNTER — Other Ambulatory Visit: Payer: Self-pay | Admitting: Endocrinology

## 2018-10-11 ENCOUNTER — Other Ambulatory Visit: Payer: Self-pay | Admitting: Family Medicine

## 2018-10-11 ENCOUNTER — Other Ambulatory Visit: Payer: Self-pay | Admitting: Endocrinology

## 2018-10-11 DIAGNOSIS — I10 Essential (primary) hypertension: Secondary | ICD-10-CM

## 2018-10-12 ENCOUNTER — Other Ambulatory Visit: Payer: Self-pay | Admitting: Physician Assistant

## 2018-10-12 DIAGNOSIS — I10 Essential (primary) hypertension: Secondary | ICD-10-CM

## 2018-10-17 ENCOUNTER — Other Ambulatory Visit: Payer: Self-pay | Admitting: Physician Assistant

## 2018-10-17 ENCOUNTER — Other Ambulatory Visit: Payer: Self-pay | Admitting: Endocrinology

## 2018-10-17 DIAGNOSIS — I10 Essential (primary) hypertension: Secondary | ICD-10-CM

## 2018-10-18 ENCOUNTER — Other Ambulatory Visit: Payer: Self-pay | Admitting: Physician Assistant

## 2018-10-18 ENCOUNTER — Other Ambulatory Visit: Payer: Self-pay | Admitting: Endocrinology

## 2018-10-18 DIAGNOSIS — I10 Essential (primary) hypertension: Secondary | ICD-10-CM

## 2018-10-20 ENCOUNTER — Telehealth: Payer: Self-pay | Admitting: General Practice

## 2018-10-20 ENCOUNTER — Encounter: Payer: Self-pay | Admitting: *Deleted

## 2018-10-20 NOTE — Telephone Encounter (Signed)
Copied from CRM [id number]. Topic: Quick Communication - Rx Refill/Question >> Oct 20, 2018 10:56 AM Jay Schlichter wrote: Medication:  atenolol (TENORMIN) 50 MG tablet  Has the patient contacted their pharmacy? Yes.   (Agent: If no, request that the patient contact the pharmacy for the refill.) (Agent: If yes, when and what did the pharmacy advise?) told pt to call office   Preferred Pharmacy (with phone number or street name): [facility]   Agent: Please be advised that RX refills may take up to 3 business days. We ask that you follow-up with your pharmacy.

## 2018-10-20 NOTE — Telephone Encounter (Signed)
My chart message sent.  Patient needs to schedule and appointment for any refills

## 2018-10-22 ENCOUNTER — Other Ambulatory Visit: Payer: Self-pay | Admitting: Endocrinology

## 2018-10-27 ENCOUNTER — Ambulatory Visit: Payer: BLUE CROSS/BLUE SHIELD | Admitting: Endocrinology

## 2018-10-27 ENCOUNTER — Telehealth: Payer: Self-pay | Admitting: Endocrinology

## 2018-10-27 NOTE — Telephone Encounter (Signed)
Pt must be seen for appt first.

## 2018-10-27 NOTE — Telephone Encounter (Signed)
MEDICATION: levothyroxine (SYNTHROID, LEVOTHROID) 150 MCG tablet  PHARMACY:  [id number] - [place] ([place] [place] - [address]  IS THIS A 90 DAY SUPPLY :   IS PATIENT OUT OF MEDICATION: Yes  IF NOT; HOW MUCH IS LEFT:   LAST APPOINTMENT DATE: @5 /11/2018  NEXT APPOINTMENT DATE:@5 /19/2020  DO WE HAVE YOUR PERMISSION TO LEAVE A DETAILED MESSAGE:  OTHER COMMENTS:  Patient had to reschedule due to not pre-rooming. States they ran out of medication on Sunday and would to know if they can have a few to hold them over.  **Let patient know to contact pharmacy at the end of the day to make sure medication is ready. **  ** Please notify patient to allow 48-72 hours to process**  **Encourage patient to contact the pharmacy for refills or they can request refills through [facility]**

## 2018-10-28 ENCOUNTER — Other Ambulatory Visit: Payer: Self-pay | Admitting: Endocrinology

## 2018-10-28 MED ORDER — LEVOTHYROXINE SODIUM 150 MCG PO TABS: 150.0000 ug | ORAL_TABLET | Freq: Every day | ORAL | 0 refills | Status: DC

## 2018-10-30 ENCOUNTER — Other Ambulatory Visit: Payer: Self-pay

## 2018-10-30 ENCOUNTER — Encounter: Payer: Self-pay | Admitting: Emergency Medicine

## 2018-10-30 ENCOUNTER — Ambulatory Visit: Payer: BLUE CROSS/BLUE SHIELD | Admitting: Emergency Medicine

## 2018-10-30 VITALS — BP 175/94 | HR 81 | Temp 98.4°F | Resp 16 | Ht 67.0 in | Wt 242.8 lb

## 2018-10-30 DIAGNOSIS — E890 Postprocedural hypothyroidism: Secondary | ICD-10-CM | POA: Diagnosis not present

## 2018-10-30 DIAGNOSIS — Z7689 Persons encountering health services in other specified circumstances: Secondary | ICD-10-CM

## 2018-10-30 DIAGNOSIS — I10 Essential (primary) hypertension: Secondary | ICD-10-CM

## 2018-10-30 MED ORDER — ATENOLOL 50 MG PO TABS: 50.0000 mg | ORAL_TABLET | Freq: Every day | ORAL | 3 refills | Status: DC

## 2018-10-30 NOTE — Patient Instructions (Addendum)
   If you have lab work done today you will be contacted with your lab results within the next 2 weeks.  If you have not heard from us then please contact us. The fastest way to get your results is to register for My Chart.   IF you received an x-ray today, you will receive an invoice from [facility]. Please contact  Radiology at [phone] with questions or concerns regarding your invoice.   IF you received labwork today, you will receive an invoice from [facility]. Please contact [facility] at [phone] with questions or concerns regarding your invoice.   Our billing staff will not be able to assist you with questions regarding bills from these companies.  You will be contacted with the lab results as soon as they are available. The fastest way to get your results is to activate your My Chart account. Instructions are located on the last page of this paperwork. If you have not heard from us regarding the results in 2 weeks, please contact this office.     DASH Eating Plan DASH stands for "Dietary Approaches to Stop Hypertension." The DASH eating plan is a healthy eating plan that has been shown to reduce high blood pressure (hypertension). It may also reduce your risk for type 2 diabetes, heart disease, and stroke. The DASH eating plan may also help with weight loss. What are tips for following this plan?  General guidelines  Avoid eating more than 2,300 mg (milligrams) of salt (sodium) a day. If you have hypertension, you may need to reduce your sodium intake to 1,500 mg a day.  Limit alcohol intake to no more than 1 drink a day for nonpregnant women and 2 drinks a day for men. One drink equals 12 oz of beer, 5 oz of wine, or 1 oz of hard liquor.  Work with your health care provider to maintain a healthy body weight or to lose weight. Ask what an ideal weight is for you.  Get at least 30 minutes of exercise that causes your heart to beat faster (aerobic  exercise) most days of the week. Activities may include walking, swimming, or biking.  Work with your health care provider or diet and nutrition specialist (dietitian) to adjust your eating plan to your individual calorie needs. Reading food labels   Check food labels for the amount of sodium per serving. Choose foods with less than 5 percent of the Daily Value of sodium. Generally, foods with less than 300 mg of sodium per serving fit into this eating plan.  To find whole grains, look for the word "whole" as the first word in the ingredient list. Shopping  Buy products labeled as "low-sodium" or "no salt added."  Buy fresh foods. Avoid canned foods and premade or frozen meals. Cooking  Avoid adding salt when cooking. Use salt-free seasonings or herbs instead of table salt or sea salt. Check with your health care provider or pharmacist before using salt substitutes.  Do not fry foods. Cook foods using healthy methods such as baking, boiling, grilling, and broiling instead.  Cook with heart-healthy oils, such as olive, canola, soybean, or sunflower oil. Meal planning  Eat a balanced diet that includes: ? 5 or more servings of fruits and vegetables each day. At each meal, try to fill half of your plate with fruits and vegetables. ? Up to 6-8 servings of whole grains each day. ? Less than 6 oz of lean meat, poultry, or fish each day. A 3-oz serving   of meat is about the same size as a deck of cards. One egg equals 1 oz. ? 2 servings of low-fat dairy each day. ? A serving of nuts, seeds, or beans 5 times each week. ? Heart-healthy fats. Healthy fats called Omega-3 fatty acids are found in foods such as flaxseeds and coldwater fish, like sardines, salmon, and mackerel.  Limit how much you eat of the following: ? Canned or prepackaged foods. ? Food that is high in trans fat, such as fried foods. ? Food that is high in saturated fat, such as fatty meat. ? Sweets, desserts, sugary drinks,  and other foods with added sugar. ? Full-fat dairy products.  Do not salt foods before eating.  Try to eat at least 2 vegetarian meals each week.  Eat more home-cooked food and less restaurant, buffet, and fast food.  When eating at a restaurant, ask that your food be prepared with less salt or no salt, if possible. What foods are recommended? The items listed may not be a complete list. Talk with your dietitian about what dietary choices are best for you. Grains Whole-grain or whole-wheat bread. Whole-grain or whole-wheat pasta. Brown rice. Oatmeal. Quinoa. Bulgur. Whole-grain and low-sodium cereals. Pita bread. Low-fat, low-sodium crackers. Whole-wheat flour tortillas. Vegetables Fresh or frozen vegetables (raw, steamed, roasted, or grilled). Low-sodium or reduced-sodium tomato and vegetable juice. Low-sodium or reduced-sodium tomato sauce and tomato paste. Low-sodium or reduced-sodium canned vegetables. Fruits All fresh, dried, or frozen fruit. Canned fruit in natural juice (without added sugar). Meat and other protein foods Skinless chicken or turkey. Ground chicken or turkey. Pork with fat trimmed off. Fish and seafood. Egg whites. Dried beans, peas, or lentils. Unsalted nuts, nut butters, and seeds. Unsalted canned beans. Lean cuts of beef with fat trimmed off. Low-sodium, lean deli meat. Dairy Low-fat (1%) or fat-free (skim) milk. Fat-free, low-fat, or reduced-fat cheeses. Nonfat, low-sodium ricotta or cottage cheese. Low-fat or nonfat yogurt. Low-fat, low-sodium cheese. Fats and oils Soft margarine without trans fats. Vegetable oil. Low-fat, reduced-fat, or light mayonnaise and salad dressings (reduced-sodium). Canola, safflower, olive, soybean, and sunflower oils. Avocado. Seasoning and other foods Herbs. Spices. Seasoning mixes without salt. Unsalted popcorn and pretzels. Fat-free sweets. What foods are not recommended? The items listed may not be a complete list. Talk with your  dietitian about what dietary choices are best for you. Grains Baked goods made with fat, such as croissants, muffins, or some breads. Dry pasta or rice meal packs. Vegetables Creamed or fried vegetables. Vegetables in a cheese sauce. Regular canned vegetables (not low-sodium or reduced-sodium). Regular canned tomato sauce and paste (not low-sodium or reduced-sodium). Regular tomato and vegetable juice (not low-sodium or reduced-sodium). Pickles. Olives. Fruits Canned fruit in a light or heavy syrup. Fried fruit. Fruit in cream or butter sauce. Meat and other protein foods Fatty cuts of meat. Ribs. Fried meat. Bacon. Sausage. Bologna and other processed lunch meats. Salami. Fatback. Hotdogs. Bratwurst. Salted nuts and seeds. Canned beans with added salt. Canned or smoked fish. Whole eggs or egg yolks. Chicken or turkey with skin. Dairy Whole or 2% milk, cream, and half-and-half. Whole or full-fat cream cheese. Whole-fat or sweetened yogurt. Full-fat cheese. Nondairy creamers. Whipped toppings. Processed cheese and cheese spreads. Fats and oils Butter. Stick margarine. Lard. Shortening. Ghee. Bacon fat. Tropical oils, such as coconut, palm kernel, or palm oil. Seasoning and other foods Salted popcorn and pretzels. Onion salt, garlic salt, seasoned salt, table salt, and sea salt. Worcestershire sauce. Tartar sauce. Barbecue sauce.   Teriyaki sauce. Soy sauce, including reduced-sodium. Steak sauce. Canned and packaged gravies. Fish sauce. Oyster sauce. Cocktail sauce. Horseradish that you find on the shelf. Ketchup. Mustard. Meat flavorings and tenderizers. Bouillon cubes. Hot sauce and Tabasco sauce. Premade or packaged marinades. Premade or packaged taco seasonings. Relishes. Regular salad dressings. Where to find more information:  National Heart, Lung, and Blood Institute: www.nhlbi.nih.gov  American Heart Association: www.heart.org Summary  The DASH eating plan is a healthy eating plan that has  been shown to reduce high blood pressure (hypertension). It may also reduce your risk for type 2 diabetes, heart disease, and stroke.  With the DASH eating plan, you should limit salt (sodium) intake to 2,300 mg a day. If you have hypertension, you may need to reduce your sodium intake to 1,500 mg a day.  When on the DASH eating plan, aim to eat more fresh fruits and vegetables, whole grains, lean proteins, low-fat dairy, and heart-healthy fats.  Work with your health care provider or diet and nutrition specialist (dietitian) to adjust your eating plan to your individual calorie needs. This information is not intended to replace advice given to you by your health care provider. Make sure you discuss any questions you have with your health care provider. Document Released: 05/24/2011 Document Revised: 05/28/2016 Document Reviewed: 05/28/2016 Elsevier Interactive Patient Education  2019 [employer]  Hypertension Hypertension, commonly called high blood pressure, is when the force of blood pumping through the arteries is too strong. The arteries are the blood vessels that carry blood from the heart throughout the body. Hypertension forces the heart to work harder to pump blood and may cause arteries to become narrow or stiff. Having untreated or uncontrolled hypertension can cause heart attacks, strokes, kidney disease, and other problems. A blood pressure reading consists of a higher number over a lower number. Ideally, your blood pressure should be below 120/80. The first ("top") number is called the systolic pressure. It is a measure of the pressure in your arteries as your heart beats. The second ("bottom") number is called the diastolic pressure. It is a measure of the pressure in your arteries as the heart relaxes. What are the causes? The cause of this condition is not known. What increases the risk? Some risk factors for high blood pressure are under your control. Others are not. Factors  you can change  Smoking.  Having type 2 diabetes mellitus, high cholesterol, or both.  Not getting enough exercise or physical activity.  Being overweight.  Having too much fat, sugar, calories, or salt (sodium) in your diet.  Drinking too much alcohol. Factors that are difficult or impossible to change  Having chronic kidney disease.  Having a family history of high blood pressure.  Age. Risk increases with age.  Race. You may be at higher risk if you are African-American.  Gender. Men are at higher risk than women before age 45. After age 65, women are at higher risk than men.  Having obstructive sleep apnea.  Stress. What are the signs or symptoms? Extremely high blood pressure (hypertensive crisis) may cause:  Headache.  Anxiety.  Shortness of breath.  Nosebleed.  Nausea and vomiting.  Severe chest pain.  Jerky movements you cannot control (seizures). How is this diagnosed? This condition is diagnosed by measuring your blood pressure while you are seated, with your arm resting on a surface. The cuff of the blood pressure monitor will be placed directly against the skin of your upper arm at the level of your   heart. It should be measured at least twice using the same arm. Certain conditions can cause a difference in blood pressure between your right and left arms. Certain factors can cause blood pressure readings to be lower or higher than normal (elevated) for a short period of time:  When your blood pressure is higher when you are in a health care provider's office than when you are at home, this is called white coat hypertension. Most people with this condition do not need medicines.  When your blood pressure is higher at home than when you are in a health care provider's office, this is called masked hypertension. Most people with this condition may need medicines to control blood pressure. If you have a high blood pressure reading during one visit or you have  normal blood pressure with other risk factors:  You may be asked to return on a different day to have your blood pressure checked again.  You may be asked to monitor your blood pressure at home for 1 week or longer. If you are diagnosed with hypertension, you may have other blood or imaging tests to help your health care provider understand your overall risk for other conditions. How is this treated? This condition is treated by making healthy lifestyle changes, such as eating healthy foods, exercising more, and reducing your alcohol intake. Your health care provider may prescribe medicine if lifestyle changes are not enough to get your blood pressure under control, and if:  Your systolic blood pressure is above 130.  Your diastolic blood pressure is above 80. Your personal target blood pressure may vary depending on your medical conditions, your age, and other factors. Follow these instructions at home: Eating and drinking   Eat a diet that is high in fiber and potassium, and low in sodium, added sugar, and fat. An example eating plan is called the DASH (Dietary Approaches to Stop Hypertension) diet. To eat this way: ? Eat plenty of fresh fruits and vegetables. Try to fill half of your plate at each meal with fruits and vegetables. ? Eat whole grains, such as whole wheat pasta, brown rice, or whole grain bread. Fill about one quarter of your plate with whole grains. ? Eat or drink low-fat dairy products, such as skim milk or low-fat yogurt. ? Avoid fatty cuts of meat, processed or cured meats, and poultry with skin. Fill about one quarter of your plate with lean proteins, such as fish, chicken without skin, beans, eggs, and tofu. ? Avoid premade and processed foods. These tend to be higher in sodium, added sugar, and fat.  Reduce your daily sodium intake. Most people with hypertension should eat less than 1,500 mg of sodium a day.  Limit alcohol intake to no more than 1 drink a day for  nonpregnant women and 2 drinks a day for men. One drink equals 12 oz of beer, 5 oz of wine, or 1 oz of hard liquor. Lifestyle   Work with your health care provider to maintain a healthy body weight or to lose weight. Ask what an ideal weight is for you.  Get at least 30 minutes of exercise that causes your heart to beat faster (aerobic exercise) most days of the week. Activities may include walking, swimming, or biking.  Include exercise to strengthen your muscles (resistance exercise), such as pilates or lifting weights, as part of your weekly exercise routine. Try to do these types of exercises for 30 minutes at least 3 days a week.  Do not   use any products that contain nicotine or tobacco, such as cigarettes and e-cigarettes. If you need help quitting, ask your health care provider.  Monitor your blood pressure at home as told by your health care provider.  Keep all follow-up visits as told by your health care provider. This is important. Medicines  Take over-the-counter and prescription medicines only as told by your health care provider. Follow directions carefully. Blood pressure medicines must be taken as prescribed.  Do not skip doses of blood pressure medicine. Doing this puts you at risk for problems and can make the medicine less effective.  Ask your health care provider about side effects or reactions to medicines that you should watch for. Contact a health care provider if:  You think you are having a reaction to a medicine you are taking.  You have headaches that keep coming back (recurring).  You feel dizzy.  You have swelling in your ankles.  You have trouble with your vision. Get help right away if:  You develop a severe headache or confusion.  You have unusual weakness or numbness.  You feel faint.  You have severe pain in your chest or abdomen.  You vomit repeatedly.  You have trouble breathing. Summary  Hypertension is when the force of blood  pumping through your arteries is too strong. If this condition is not controlled, it may put you at risk for serious complications.  Your personal target blood pressure may vary depending on your medical conditions, your age, and other factors. For most people, a normal blood pressure is less than 120/80.  Hypertension is treated with lifestyle changes, medicines, or a combination of both. Lifestyle changes include weight loss, eating a healthy, low-sodium diet, exercising more, and limiting alcohol. This information is not intended to replace advice given to you by your health care provider. Make sure you discuss any questions you have with your health care provider. Document Released: 06/04/2005 Document Revised: 05/02/2016 Document Reviewed: 05/02/2016 Elsevier Interactive Patient Education  2019 [employer]  

## 2018-10-30 NOTE — Progress Notes (Signed)
Danielle Khan 48 y.o.   Chief Complaint  Patient presents with  . Medication Refill    ATENOLOL  . Establish Care    HISTORY OF PRESENT ILLNESS: This is a 48 y.o. female here to establish care with me.  Used to see PA Barnett Abu.  Has a history of hypertension and hypothyroidism.  Sees Dr. Lucianne Muss for endocrinology and takes Synthroid 150 mcg a day.  Ran out of atenolol 1 to 2 weeks ago.  Asymptomatic.  Needs medication refill.  No other complaints or medical concerns today.  HPI   Prior to Admission medications   Medication Sig Start Date End Date Taking? Authorizing Provider  atenolol (TENORMIN) 50 MG tablet TAKE 1 TABLET BY MOUTH ONCE DAILY . APPOINTMENT REQUIRED FOR FUTURE REFILLS 09/14/18  Yes Collie Siad A, MD  levothyroxine (SYNTHROID) 150 MCG tablet Take 1 tablet (150 mcg total) by mouth daily. 10/28/18  Yes Reather Littler, MD  triamcinolone (NASACORT AQ) 55 MCG/ACT AERO nasal inhaler Place 2 sprays into the nose daily as needed. 06/09/18   Peyton Najjar, MD    No Known Allergies  Patient Active Problem List   Diagnosis Date Noted  . 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 Hypertension 08/19/2013    Past Medical History:  Diagnosis Date  . Hypertension   . Thyroid disease     Past Surgical History:  Procedure Laterality Date  . ABDOMINAL HYSTERECTOMY      Social History   Socioeconomic History  . Marital status: Married    Spouse name: Not on file  . Number of children: Not on file  . Years of education: Not on file  . Highest education level: Not on file  Occupational History  . Not on file  Social Needs  . Financial resource strain: Not on file  . Food insecurity:    Worry: Not on file    Inability: Not on file  . Transportation needs:    Medical: Not on file    Non-medical: Not on file  Tobacco Use  . Smoking status: Never Smoker  . Smokeless tobacco: Current User   Types: Snuff  Substance and Sexual Activity  . Alcohol use: No    Alcohol/week: 0.0 standard drinks  . Drug use: No  . Sexual activity: Yes  Lifestyle  . Physical activity:    Days per week: Not on file    Minutes per session: Not on file  . Stress: Not on file  Relationships  . Social connections:    Talks on phone: Not on file    Gets together: Not on file    Attends religious service: Not on file    Active member of club or organization: Not on file    Attends meetings of clubs or organizations: Not on file    Relationship status: Not on file  . Intimate partner violence:    Fear of current or ex partner: Not on file    Emotionally abused: Not on file    Physically abused: Not on file    Forced sexual activity: Not on file  Other Topics Concern  . Not on file  Social History Narrative  . Not on file    Family History  Problem Relation Age of Onset  . Breast cancer Maternal Aunt      Review of Systems  Constitutional: Negative.  Negative for chills and fever.  HENT: Negative.  Negative for congestion, nosebleeds and sore throat.  Eyes: Negative for blurred vision and double vision.  Respiratory: Negative.  Negative for cough and shortness of breath.   Cardiovascular: Negative.  Negative for chest pain and palpitations.  Gastrointestinal: Negative for abdominal pain, diarrhea, nausea and vomiting.  Genitourinary: Negative.   Musculoskeletal: Negative for myalgias.  Skin: Negative.  Negative for rash.  Neurological: Negative.  Negative for dizziness and headaches.  Endo/Heme/Allergies: Negative.   All other systems reviewed and are negative.  Vitals:   10/30/18 1553  BP: (!) 175/94  Pulse: 81  Resp: 16  Temp: 98.4 F (36.9 C)  SpO2: 99%     Physical Exam Vitals signs reviewed.  Constitutional:      Appearance: Normal appearance.  HENT:     Head: Normocephalic and atraumatic.     Nose: Nose normal.     Mouth/Throat:     Mouth: Mucous membranes are  moist.     Pharynx: Oropharynx is clear.  Eyes:     Extraocular Movements: Extraocular movements intact.     Conjunctiva/sclera: Conjunctivae normal.     Pupils: Pupils are equal, round, and reactive to light.  Neck:     Musculoskeletal: Normal range of motion and neck supple.  Cardiovascular:     Rate and Rhythm: Normal rate and regular rhythm.     Pulses: Normal pulses.     Heart sounds: Normal heart sounds.  Pulmonary:     Effort: Pulmonary effort is normal.     Breath sounds: Normal breath sounds.  Abdominal:     General: Abdomen is flat. Bowel sounds are normal.     Palpations: Abdomen is soft.     Tenderness: There is no abdominal tenderness.  Musculoskeletal: Normal range of motion.  Skin:    General: Skin is warm and dry.     Capillary Refill: Capillary refill takes less than 2 seconds.  Neurological:     General: No focal deficit present.     Mental Status: Danielle Khan is alert and oriented to person, place, and time.  Psychiatric:        Mood and Affect: Mood normal.        Behavior: Behavior normal.    A total of 25 minutes was spent in the room with the patient, greater than 50% of which was in counseling/coordination of care regarding hypertension associated cardiovascular risk, importance of medication, diet and nutrition, in particular DASHdiet, importance of physical activity, brisk walking 30 minutes a day for 5 days, prognosis, review old medical records and need for follow-up.   ASSESSMENT & PLAN: Danielle Khan was seen today for medication refill and establish care.  Diagnoses and all orders for this visit:  Essential hypertension -     atenolol (TENORMIN) 50 MG tablet; Take 1 tablet (50 mg total) by mouth daily.  Hypothyroidism, postradioiodine therapy  Encounter to establish care    Patient Instructions       If you have lab work done today you will be contacted with your lab results within the next 2 weeks.  If you have not heard from [place] then please  contact us. The fastest way to get your results is to register for My Chart.   IF you received an x-ray today, you will receive an invoice from [facility]. Please contact [facility] at [phone] with questions or concerns regarding your invoice.   IF you received labwork today, you will receive an invoice from [facility]. Please contact [facility] at [phone] with questions or concerns regarding your invoice.   Our  billing staff will not be able to assist you with questions regarding bills from these companies.  You will be contacted with the lab results as soon as they are available. The fastest way to get your results is to activate your My Chart account. Instructions are located on the last page of this paperwork. If you have not heard from [place] regarding the results in 2 weeks, please contact this office.     DASH Eating Plan DASH stands for "Dietary Approaches to Stop Hypertension." The DASH eating plan is a healthy eating plan that has been shown to reduce high blood pressure (hypertension). It may also reduce your risk for type 2 diabetes, heart disease, and stroke. The DASH eating plan may also help with weight loss. What are tips for following this plan?  General guidelines  Avoid eating more than 2,300 mg (milligrams) of salt (sodium) a day. If you have hypertension, you may need to reduce your sodium intake to 1,500 mg a day.  Limit alcohol intake to no more than 1 drink a day for nonpregnant women and 2 drinks a day for men. One drink equals 12 oz of beer, 5 oz of wine, or 1 oz of hard liquor.  Work with your health care provider to maintain a healthy body weight or to lose weight. Ask what an ideal weight is for you.  Get at least 30 minutes of exercise that causes your heart to beat faster (aerobic exercise) most days of the week. Activities may include walking, swimming, or biking.  Work with your health care provider or diet and nutrition specialist  (dietitian) to adjust your eating plan to your individual calorie needs. Reading food labels   Check food labels for the amount of sodium per serving. Choose foods with less than 5 percent of the Daily Value of sodium. Generally, foods with less than 300 mg of sodium per serving fit into this eating plan.  To find whole grains, look for the word "whole" as the first word in the ingredient list. Shopping  Buy products labeled as "low-sodium" or "no salt added."  Buy fresh foods. Avoid canned foods and premade or frozen meals. Cooking  Avoid adding salt when cooking. Use salt-free seasonings or herbs instead of table salt or sea salt. Check with your health care provider or pharmacist before using salt substitutes.  Do not fry foods. Cook foods using healthy methods such as baking, boiling, grilling, and broiling instead.  Cook with heart-healthy oils, such as olive, canola, soybean, or sunflower oil. Meal planning  Eat a balanced diet that includes: ? 5 or more servings of fruits and vegetables each day. At each meal, try to fill half of your plate with fruits and vegetables. ? Up to 6-8 servings of whole grains each day. ? Less than 6 oz of lean meat, poultry, or fish each day. A 3-oz serving of meat is about the same size as a deck of cards. One egg equals 1 oz. ? 2 servings of low-fat dairy each day. ? A serving of nuts, seeds, or beans 5 times each week. ? Heart-healthy fats. Healthy fats called Omega-3 fatty acids are found in foods such as flaxseeds and coldwater fish, like sardines, salmon, and mackerel.  Limit how much you eat of the following: ? Canned or prepackaged foods. ? Food that is high in trans fat, such as fried foods. ? Food that is high in saturated fat, such as fatty meat. ? Sweets, desserts, sugary drinks, and other  foods with added sugar. ? Full-fat dairy products.  Do not salt foods before eating.  Try to eat at least 2 vegetarian meals each week.  Eat  more home-cooked food and less restaurant, buffet, and fast food.  When eating at a restaurant, ask that your food be prepared with less salt or no salt, if possible. What foods are recommended? The items listed may not be a complete list. Talk with your dietitian about what dietary choices are best for you. Grains Whole-grain or whole-wheat bread. Whole-grain or whole-wheat pasta. Brown rice. Orpah Cobb. Bulgur. Whole-grain and low-sodium cereals. Pita bread. Low-fat, low-sodium crackers. Whole-wheat flour tortillas. Vegetables Fresh or frozen vegetables (raw, steamed, roasted, or grilled). Low-sodium or reduced-sodium tomato and vegetable juice. Low-sodium or reduced-sodium tomato sauce and tomato paste. Low-sodium or reduced-sodium canned vegetables. Fruits All fresh, dried, or frozen fruit. Canned fruit in natural juice (without added sugar). Meat and other protein foods Skinless chicken or [place]. Ground chicken or [place]. Pork with fat trimmed off. Fish and seafood. Egg whites. Dried beans, peas, or lentils. Unsalted nuts, nut butters, and seeds. Unsalted canned beans. Lean cuts of beef with fat trimmed off. Low-sodium, lean deli meat. Dairy Low-fat (1%) or fat-free (skim) milk. Fat-free, low-fat, or reduced-fat cheeses. Nonfat, low-sodium ricotta or cottage cheese. Low-fat or nonfat yogurt. Low-fat, low-sodium cheese. Fats and oils Soft margarine without trans fats. Vegetable oil. Low-fat, reduced-fat, or light mayonnaise and salad dressings (reduced-sodium). Canola, safflower, olive, soybean, and sunflower oils. Avocado. Seasoning and other foods Herbs. Spices. Seasoning mixes without salt. Unsalted popcorn and pretzels. Fat-free sweets. What foods are not recommended? The items listed may not be a complete list. Talk with your dietitian about what dietary choices are best for you. Grains Baked goods made with fat, such as croissants, muffins, or some breads. Dry pasta or rice meal  packs. Vegetables Creamed or fried vegetables. Vegetables in a cheese sauce. Regular canned vegetables (not low-sodium or reduced-sodium). Regular canned tomato sauce and paste (not low-sodium or reduced-sodium). Regular tomato and vegetable juice (not low-sodium or reduced-sodium). Rosita Fire. Olives. Fruits Canned fruit in a light or heavy syrup. Fried fruit. Fruit in cream or butter sauce. Meat and other protein foods Fatty cuts of meat. Ribs. Fried meat. Tomasa Blase. Sausage. Bologna and other processed lunch meats. Salami. Fatback. Hotdogs. Bratwurst. Salted nuts and seeds. Canned beans with added salt. Canned or smoked fish. Whole eggs or egg yolks. Chicken or [place] with skin. Dairy Whole or 2% milk, cream, and half-and-half. Whole or full-fat cream cheese. Whole-fat or sweetened yogurt. Full-fat cheese. Nondairy creamers. Whipped toppings. Processed cheese and cheese spreads. Fats and oils Butter. Stick margarine. Lard. Shortening. Ghee. Bacon fat. Tropical oils, such as coconut, palm kernel, or palm oil. Seasoning and other foods Salted popcorn and pretzels. Onion salt, garlic salt, seasoned salt, table salt, and sea salt. Worcestershire sauce. Tartar sauce. Barbecue sauce. Teriyaki sauce. Soy sauce, including reduced-sodium. Steak sauce. Canned and packaged gravies. Fish sauce. Oyster sauce. Cocktail sauce. Horseradish that you find on the shelf. Ketchup. Mustard. Meat flavorings and tenderizers. Bouillon cubes. Hot sauce and Tabasco sauce. Premade or packaged marinades. Premade or packaged taco seasonings. Relishes. Regular salad dressings. Where to find more information:  National Heart, Lung, and Blood Institute: PopSteam.is  American Heart Association: www.heart.org Summary  The DASH eating plan is a healthy eating plan that has been shown to reduce high blood pressure (hypertension). It may also reduce your risk for type 2 diabetes, heart disease, and stroke.  With  the DASH eating  plan, you should limit salt (sodium) intake to 2,300 mg a day. If you have hypertension, you may need to reduce your sodium intake to 1,500 mg a day.  When on the DASH eating plan, aim to eat more fresh fruits and vegetables, whole grains, lean proteins, low-fat dairy, and heart-healthy fats.  Work with your health care provider or diet and nutrition specialist (dietitian) to adjust your eating plan to your individual calorie needs. This information is not intended to replace advice given to you by your health care provider. Make sure you discuss any questions you have with your health care provider. Document Released: 05/24/2011 Document Revised: 05/28/2016 Document Reviewed: 05/28/2016 Elsevier Interactive Patient Education  2019 [employer].  Hypertension Hypertension, commonly called high blood pressure, is when the force of blood pumping through the arteries is too strong. The arteries are the blood vessels that carry blood from the heart throughout the body. Hypertension forces the heart to work harder to pump blood and may cause arteries to become narrow or stiff. Having untreated or uncontrolled hypertension can cause heart attacks, strokes, kidney disease, and other problems. A blood pressure reading consists of a higher number over a lower number. Ideally, your blood pressure should be below 120/80. The first ("top") number is called the systolic pressure. It is a measure of the pressure in your arteries as your heart beats. The second ("bottom") number is called the diastolic pressure. It is a measure of the pressure in your arteries as the heart relaxes. What are the causes? The cause of this condition is not known. What increases the risk? Some risk factors for high blood pressure are under your control. Others are not. Factors you can change  Smoking.  Having type 2 diabetes mellitus, high cholesterol, or both.  Not getting enough exercise or physical activity.  Being  overweight.  Having too much fat, sugar, calories, or salt (sodium) in your diet.  Drinking too much alcohol. Factors that are difficult or impossible to change  Having chronic kidney disease.  Having a family history of high blood pressure.  Age. Risk increases with age.  Race. You may be at higher risk if you are African-American.  Gender. Men are at higher risk than women before age 53. After age 68, women are at higher risk than men.  Having obstructive sleep apnea.  Stress. What are the signs or symptoms? Extremely high blood pressure (hypertensive crisis) may cause:  Headache.  Anxiety.  Shortness of breath.  Nosebleed.  Nausea and vomiting.  Severe chest pain.  Jerky movements you cannot control (seizures). How is this diagnosed? This condition is diagnosed by measuring your blood pressure while you are seated, with your arm resting on a surface. The cuff of the blood pressure monitor will be placed directly against the skin of your upper arm at the level of your heart. It should be measured at least twice using the same arm. Certain conditions can cause a difference in blood pressure between your right and left arms. Certain factors can cause blood pressure readings to be lower or higher than normal (elevated) for a short period of time:  When your blood pressure is higher when you are in a health care provider's office than when you are at home, this is called white coat hypertension. Most people with this condition do not need medicines.  When your blood pressure is higher at home than when you are in a health care provider's office, this is  called masked hypertension. Most people with this condition may need medicines to control blood pressure. If you have a high blood pressure reading during one visit or you have normal blood pressure with other risk factors:  You may be asked to return on a different day to have your blood pressure checked again.  You may  be asked to monitor your blood pressure at home for 1 week or longer. If you are diagnosed with hypertension, you may have other blood or imaging tests to help your health care provider understand your overall risk for other conditions. How is this treated? This condition is treated by making healthy lifestyle changes, such as eating healthy foods, exercising more, and reducing your alcohol intake. Your health care provider may prescribe medicine if lifestyle changes are not enough to get your blood pressure under control, and if:  Your systolic blood pressure is above 130.  Your diastolic blood pressure is above 80. Your personal target blood pressure may vary depending on your medical conditions, your age, and other factors. Follow these instructions at home: Eating and drinking   Eat a diet that is high in fiber and potassium, and low in sodium, added sugar, and fat. An example eating plan is called the DASH (Dietary Approaches to Stop Hypertension) diet. To eat this way: ? Eat plenty of fresh fruits and vegetables. Try to fill half of your plate at each meal with fruits and vegetables. ? Eat whole grains, such as whole wheat pasta, brown rice, or whole grain bread. Fill about one quarter of your plate with whole grains. ? Eat or drink low-fat dairy products, such as skim milk or low-fat yogurt. ? Avoid fatty cuts of meat, processed or cured meats, and poultry with skin. Fill about one quarter of your plate with lean proteins, such as fish, chicken without skin, beans, eggs, and tofu. ? Avoid premade and processed foods. These tend to be higher in sodium, added sugar, and fat.  Reduce your daily sodium intake. Most people with hypertension should eat less than 1,500 mg of sodium a day.  Limit alcohol intake to no more than 1 drink a day for nonpregnant women and 2 drinks a day for men. One drink equals 12 oz of beer, 5 oz of wine, or 1 oz of hard liquor. Lifestyle   Work with your  health care provider to maintain a healthy body weight or to lose weight. Ask what an ideal weight is for you.  Get at least 30 minutes of exercise that causes your heart to beat faster (aerobic exercise) most days of the week. Activities may include walking, swimming, or biking.  Include exercise to strengthen your muscles (resistance exercise), such as pilates or lifting weights, as part of your weekly exercise routine. Try to do these types of exercises for 30 minutes at least 3 days a week.  Do not use any products that contain nicotine or tobacco, such as cigarettes and e-cigarettes. If you need help quitting, ask your health care provider.  Monitor your blood pressure at home as told by your health care provider.  Keep all follow-up visits as told by your health care provider. This is important. Medicines  Take over-the-counter and prescription medicines only as told by your health care provider. Follow directions carefully. Blood pressure medicines must be taken as prescribed.  Do not skip doses of blood pressure medicine. Doing this puts you at risk for problems and can make the medicine less effective.  Ask your health  care provider about side effects or reactions to medicines that you should watch for. Contact a health care provider if:  You think you are having a reaction to a medicine you are taking.  You have headaches that keep coming back (recurring).  You feel dizzy.  You have swelling in your ankles.  You have trouble with your vision. Get help right away if:  You develop a severe headache or confusion.  You have unusual weakness or numbness.  You feel faint.  You have severe pain in your chest or abdomen.  You vomit repeatedly.  You have trouble breathing. Summary  Hypertension is when the force of blood pumping through your arteries is too strong. If this condition is not controlled, it may put you at risk for serious complications.  Your personal  target blood pressure may vary depending on your medical conditions, your age, and other factors. For most people, a normal blood pressure is less than 120/80.  Hypertension is treated with lifestyle changes, medicines, or a combination of both. Lifestyle changes include weight loss, eating a healthy, low-sodium diet, exercising more, and limiting alcohol. This information is not intended to replace advice given to you by your health care provider. Make sure you discuss any questions you have with your health care provider. Document Released: 06/04/2005 Document Revised: 05/02/2016 Document Reviewed: 05/02/2016 Elsevier Interactive Patient Education  2019 Elsevier Inc.      Edwina Barth, MD [facility]

## 2018-11-04 ENCOUNTER — Ambulatory Visit: Payer: BLUE CROSS/BLUE SHIELD | Admitting: Endocrinology

## 2018-11-11 ENCOUNTER — Telehealth: Payer: Self-pay | Admitting: Endocrinology

## 2018-11-11 ENCOUNTER — Other Ambulatory Visit: Payer: Self-pay | Admitting: Endocrinology

## 2018-11-11 ENCOUNTER — Other Ambulatory Visit: Payer: Self-pay

## 2018-11-11 ENCOUNTER — Other Ambulatory Visit (INDEPENDENT_AMBULATORY_CARE_PROVIDER_SITE_OTHER): Payer: BLUE CROSS/BLUE SHIELD

## 2018-11-11 DIAGNOSIS — E890 Postprocedural hypothyroidism: Secondary | ICD-10-CM

## 2018-11-11 NOTE — Telephone Encounter (Signed)
MEDICATION: levothyroxine (SYNTHROID) 150 MCG tablet  PHARMACY:   [facility] [address] ([address]), [place] - [address] [phone] (Phone) [phone] (Fax)    IS THIS A 90 DAY SUPPLY :  If possible  IS PATIENT OUT OF MEDICATION: Yes  IF NOT; HOW MUCH IS LEFT: 0  LAST APPOINTMENT DATE: @5 /26/2020  NEXT APPOINTMENT DATE:@6 /07/2018  DO WE HAVE YOUR PERMISSION TO LEAVE A DETAILED MESSAGE: Yes  OTHER COMMENTS:    **Let patient know to contact pharmacy at the end of the day to make sure medication is ready. **  ** Please notify patient to allow 48-72 hours to process**  **Encourage patient to contact the pharmacy for refills or they can request refills through [facility]**

## 2018-11-12 ENCOUNTER — Other Ambulatory Visit: Payer: Self-pay

## 2018-11-12 LAB — TSH: TSH: 4.7 u[IU]/mL — ABNORMAL HIGH (ref 0.35–4.50)

## 2018-11-12 LAB — T4, FREE: Free T4: 1 ng/dL (ref 0.60–1.60)

## 2018-11-12 MED ORDER — LEVOTHYROXINE SODIUM 150 MCG PO TABS: 150.0000 ug | ORAL_TABLET | Freq: Every day | ORAL | 0 refills | Status: DC

## 2018-11-12 NOTE — Telephone Encounter (Signed)
Rx sent 

## 2018-11-18 ENCOUNTER — Ambulatory Visit (INDEPENDENT_AMBULATORY_CARE_PROVIDER_SITE_OTHER): Payer: BLUE CROSS/BLUE SHIELD | Admitting: Endocrinology

## 2018-11-18 ENCOUNTER — Other Ambulatory Visit: Payer: Self-pay

## 2018-11-18 ENCOUNTER — Encounter: Payer: Self-pay | Admitting: Endocrinology

## 2018-11-18 DIAGNOSIS — E890 Postprocedural hypothyroidism: Secondary | ICD-10-CM | POA: Diagnosis not present

## 2018-11-18 MED ORDER — LEVOTHYROXINE SODIUM 150 MCG PO TABS: ORAL_TABLET | ORAL | 2 refills | Status: DC

## 2018-11-18 NOTE — Progress Notes (Signed)
Patient ID: Danielle Khan, female   DOB: June 12, 1971, 48 y.o.   MRN: [id number]   Today's office visit was provided via telemedicine using video technique Explained to the patient and the the limitations of evaluation and management by telemedicine and the availability of in person appointments.  The patient understood the limitations and agreed to proceed. Patient also understood that the telehealth visit is billable. . Location of the patient: Home . Location of the provider: Office Only the patient and myself were participating in the encounter    Reason for Appointment:  Hypothyroidism, followup visit    History of Present Illness:   The patient was first seen in consultation in 03/2012 for management of her hyperthyroidism. She had had Graves' disease since the year 2001 and had been on long-term methimazole treatment but had not been on any dictations for 2 years prior to evaluation. Because of persistent hyperthyroidism and a free T4 level of 1.65 she was given 16.75 mCi of I-131 on 05/02/12  When seen for followup in 3/15 she was significantly hypothyroid with symptoms of feeling less cold, tired and  some hair shedding.  She initially given 150 mcg on her first visit    However because of relatively low TSH in 6/15  her dose was reduced to 125 mcg but subsequently went back to 150 mcg  RECENT HISTORY:  She has been on the 150 g levothyroxine dose, was told to take 6-1/2 tablets a week since 04/2015 However she has not been seen in follow-up since 09/2016  She also has not had any labs done until recently  She continues to complain of feeling cold and tired as well as having some hair loss Currently she is taking her levothyroxine supplement fairly consistently in the morning before breakfast She is taking 1 tablet 7 days a week now  Last night because of not having a prescription she had been irregular with her levothyroxine dose but has been  taking it for at least 1 week prior to her recent labs  She has had a gradual weight gain  TSH is now mildly increased at 4.7  Lab Results  Component Value Date   TSH 4.70 (H) 11/11/2018   TSH 3.29 09/24/2016   TSH 1.95 09/01/2015   FREET4 1.00 11/11/2018   FREET4 1.20 09/24/2016   FREET4 1.13 09/01/2015    Wt Readings from Last 3 Encounters:  10/30/18 242 lb 12.8 oz (110.1 kg)  06/09/18 238 lb (108 kg)  07/13/17 231 lb (104.8 kg)     Allergies as of 11/18/2018   No Known Allergies     Medication List       Accurate as of November 18, 2018  8:40 AM. If you have any questions, ask your nurse or doctor.        atenolol 50 MG tablet Commonly known as:  TENORMIN Take 1 tablet (50 mg total) by mouth daily.   levothyroxine 150 MCG tablet Commonly known as:  SYNTHROID Take 1 tablet (150 mcg total) by mouth daily.   triamcinolone 55 MCG/ACT Aero nasal inhaler Commonly known as:  Nasacort AQ Place 2 sprays into the nose daily as needed.       Allergies: No Known Allergies  Past Medical History:  Diagnosis Date  . Hypertension   . Thyroid disease     Past Surgical History:  Procedure Laterality Date  . ABDOMINAL HYSTERECTOMY      Family History  Problem Relation Age of  Onset  . Breast cancer Maternal Aunt     Social History:  reports that she has never smoked. Her smokeless tobacco use includes snuff. She reports that she does not drink alcohol or use drugs.  REVIEW Of SYSTEMS:  Weight history:  Wt Readings from Last 3 Encounters:  10/30/18 242 lb 12.8 oz (110.1 kg)  06/09/18 238 lb (108 kg)  07/13/17 231 lb (104.8 kg)    Hypertension: She has had difficulty with blood pressure control because of not taking her medication  Recently seen by PCP and restarted on atenolol but not chlorthalidone She has not checked her blood pressure on her own recently also  BP Readings from Last 3 Encounters:  10/30/18 (!) 175/94  06/09/18 (!) 157/90  07/13/17 (!)  156/100        Examination:   There were no vitals taken for this visit.   She looks well     Assessment   Hypothyroidism, post ablative for several years  She is again complaining of fatigue and cold intolerance and some hair loss This may be partly related to her not having consistent prescription for her levothyroxine  Although she has taken her levothyroxine at least for the last week her TSH is still high at 4.7 and most likely will need a higher dose  She is usually compliant with taking her medication before breakfast   Treatment:  Levothyroxine 150 dose to be taken daily along with extra half a pill once a week She will need regular follow-up  She will follow-up with PCP for management of her hypertension Most likely will need to go back to chlorthalidone or use a potassium sparing diuretic    Reather LittlerAjay Izza Bickle 11/18/2018, 8:40 AM

## 2019-02-19 ENCOUNTER — Other Ambulatory Visit: Payer: Self-pay | Admitting: Endocrinology

## 2019-02-20 ENCOUNTER — Other Ambulatory Visit: Payer: Self-pay

## 2019-02-20 ENCOUNTER — Other Ambulatory Visit (INDEPENDENT_AMBULATORY_CARE_PROVIDER_SITE_OTHER): Payer: BC Managed Care – PPO

## 2019-02-20 DIAGNOSIS — E890 Postprocedural hypothyroidism: Secondary | ICD-10-CM

## 2019-02-20 NOTE — Addendum Note (Signed)
Addended by: STONE-ELMORE, Alicen Donalson I on: 02/20/2019 03:46 PM   Modules accepted: Orders  

## 2019-02-20 NOTE — Addendum Note (Signed)
Addended by: Kaylyn Lim I on: 02/20/2019 03:46 PM   Modules accepted: Orders

## 2019-02-21 LAB — T4, FREE: Free T4: 1.58 ng/dL (ref 0.82–1.77)

## 2019-02-21 LAB — TSH: TSH: 0.105 u[IU]/mL — ABNORMAL LOW (ref 0.450–4.500)

## 2019-02-24 ENCOUNTER — Other Ambulatory Visit: Payer: Self-pay

## 2019-02-24 ENCOUNTER — Encounter: Payer: Self-pay | Admitting: Endocrinology

## 2019-02-24 ENCOUNTER — Encounter: Payer: BC Managed Care – PPO | Admitting: Endocrinology

## 2019-02-25 ENCOUNTER — Other Ambulatory Visit: Payer: Self-pay

## 2019-02-25 MED ORDER — LEVOTHYROXINE SODIUM 150 MCG PO TABS: ORAL_TABLET | ORAL | 0 refills | Status: DC

## 2019-02-25 NOTE — Progress Notes (Signed)
This encounter was created in error - please disregard.

## 2019-03-02 ENCOUNTER — Other Ambulatory Visit: Payer: Self-pay

## 2019-03-02 ENCOUNTER — Ambulatory Visit (INDEPENDENT_AMBULATORY_CARE_PROVIDER_SITE_OTHER): Payer: BC Managed Care – PPO | Admitting: Endocrinology

## 2019-03-02 ENCOUNTER — Encounter: Payer: Self-pay | Admitting: Endocrinology

## 2019-03-02 VITALS — BP 140/90 | HR 83 | Ht 67.0 in | Wt 241.4 lb

## 2019-03-02 DIAGNOSIS — E890 Postprocedural hypothyroidism: Secondary | ICD-10-CM

## 2019-03-02 NOTE — Progress Notes (Signed)
Patient ID: Danielle Khan, female   DOB: 01/19/1971, 48 y.o.   MRN: [id number]      Reason for Appointment:  Hypothyroidism, followup visit    History of Present Illness:   The patient was first seen in consultation in 03/2012 for management of her hyperthyroidism. She had had Graves' disease since the year 2001 and had been on long-term methimazole treatment but had not been on any dictations for 2 years prior to evaluation. Because of persistent hyperthyroidism and a free T4 level of 1.65 she was given 16.75 mCi of I-131 on 05/02/12  When seen for followup in 3/15 she was significantly hypothyroid with symptoms of feeling less cold, tired and  some hair shedding.  She initially given 150 mcg on her first visit    However because of relatively low TSH in 6/15  her dose was reduced to 125 mcg but subsequently went back to 150 mcg  RECENT HISTORY:  She has been on the 150 g levothyroxine dose, was told to take 6-1/2 tablets a week since 04/2015 However she had not been seen in follow-up since 09/2016 and was finally seen in 6/20  On discussion again she appears to have not had her medication for thyroid regularly prior to our visit for some time She had only taken the levothyroxine regularly for about a week prior to her labs  She was in June complaining of feeling cold and tired as well as having some hair loss Currently she is taking an extra half tablet per week on Wednesdays since her visit in June since she was symptomatic and TSH was 4.7  Her weight has leveled off  TSH is now suppressed at 0.10 with upper normal free T4  Lab Results  Component Value Date   TSH 0.105 (L) 02/20/2019   TSH 4.70 (H) 11/11/2018   TSH 3.29 09/24/2016   FREET4 1.58 02/20/2019   FREET4 1.00 11/11/2018   FREET4 1.20 09/24/2016    Wt Readings from Last 3 Encounters:  03/02/19 241 lb 6.4 oz (109.5 kg)  10/30/18 242 lb 12.8 oz (110.1 kg)  06/09/18 238 lb (108 kg)     Allergies as of 03/02/2019   No Known Allergies     Medication List       Accurate as of March 02, 2019  4:19 PM. If you have any questions, ask your nurse or doctor.        atenolol 50 MG tablet Commonly known as: TENORMIN Take 1 tablet (50 mg total) by mouth daily.   levothyroxine 150 MCG tablet Commonly known as: SYNTHROID TAKE 1 TABLET BY MOUTH ONCE DAILY WITH  EXTRA  HALF  TABLET  ON  SUNDAYS   triamcinolone 55 MCG/ACT Aero nasal inhaler Commonly known as: Nasacort AQ Place 2 sprays into the nose daily as needed.       Allergies: No Known Allergies  Past Medical History:  Diagnosis Date  . Hypertension   . Thyroid disease     Past Surgical History:  Procedure Laterality Date  . ABDOMINAL HYSTERECTOMY      Family History  Problem Relation Age of Onset  . Breast cancer Maternal Aunt     Social History:  reports that she has never smoked. Her smokeless tobacco use includes snuff. She reports that she does not drink alcohol or use drugs.  REVIEW Of SYSTEMS:  Weight history:  Wt Readings from Last 3 Encounters:  03/02/19 241 lb 6.4 oz (109.5 kg)  10/30/18 242 lb 12.8 oz (110.1 kg)  06/09/18 238 lb (108 kg)    Hypertension: She has had difficulty with blood pressure control for various reasons  Was seen by PCP and restarted on atenolol but not chlorthalidone back in 5/20, has not had a follow-up  She has not checked her blood pressure on her own recently   BP Readings from Last 3 Encounters:  03/02/19 140/90  10/30/18 (!) 175/94  06/09/18 (!) 157/90   She has been told to have prediabetes and has not called her PCP for follow-up  Lab Results  Component Value Date   HGBA1C 6.0 (H) 11/26/2016   Lab Results  Component Value Date   MICROALBUR 1.56 08/15/2013   LDLCALC 133 (H) 09/04/2013   CREATININE 0.91 11/26/2016     Examination:   BP 140/90 (BP Location: Left Arm, Patient Position: Sitting, Cuff Size: Large)   Pulse 83   Ht 5'  7" (1.702 m)   Wt 241 lb 6.4 oz (109.5 kg)   SpO2 98%   BMI 37.81 kg/m    She looks well     Assessment   Hypothyroidism, post ablative for several years  She is now more consistently taking her levothyroxine as directed Her symptoms of fatigue and cold intolerance are better now Weight has leveled off  Although it was estimated that she will need a little more than 150 mcg daily of her levothyroxine she now has suppressed TSH with taking the equivalent of 160 mcg daily No symptoms of over replacement  HYPERTENSION: Not controlled and she is being treated by her PCP   Treatment:  Levothyroxine 150 dose to be taken daily but only half a tablet on Wednesdays  She will get her TSH checked in 6 weeks and come back in follow-up in 4 months to make sure she has a stable dose  She will follow-up with PCP for management of her hypertension and prediabetes She declined a flu shot today  Danielle Khan 03/02/2019, 4:19 PM

## 2019-03-03 ENCOUNTER — Telehealth: Payer: Self-pay | Admitting: Emergency Medicine

## 2019-03-03 NOTE — Telephone Encounter (Signed)
Called pt and lvmtcb and schedule ov in 2-3 weeks

## 2019-03-28 ENCOUNTER — Other Ambulatory Visit: Payer: Self-pay | Admitting: Endocrinology

## 2019-04-10 ENCOUNTER — Other Ambulatory Visit: Payer: Self-pay

## 2019-04-10 ENCOUNTER — Other Ambulatory Visit (INDEPENDENT_AMBULATORY_CARE_PROVIDER_SITE_OTHER): Payer: BC Managed Care – PPO

## 2019-04-10 DIAGNOSIS — E890 Postprocedural hypothyroidism: Secondary | ICD-10-CM | POA: Diagnosis not present

## 2019-04-10 LAB — TSH: TSH: 0.13 u[IU]/mL — ABNORMAL LOW (ref 0.35–4.50)

## 2019-04-13 ENCOUNTER — Other Ambulatory Visit: Payer: Self-pay

## 2019-04-13 ENCOUNTER — Ambulatory Visit (INDEPENDENT_AMBULATORY_CARE_PROVIDER_SITE_OTHER): Payer: BC Managed Care – PPO | Admitting: Endocrinology

## 2019-04-13 ENCOUNTER — Other Ambulatory Visit: Payer: BC Managed Care – PPO

## 2019-04-13 ENCOUNTER — Encounter: Payer: Self-pay | Admitting: Endocrinology

## 2019-04-13 VITALS — BP 126/84 | HR 74 | Ht 67.0 in | Wt 235.0 lb

## 2019-04-13 DIAGNOSIS — R7303 Prediabetes: Secondary | ICD-10-CM | POA: Diagnosis not present

## 2019-04-13 DIAGNOSIS — E890 Postprocedural hypothyroidism: Secondary | ICD-10-CM

## 2019-04-13 MED ORDER — EUTHYROX 125 MCG PO TABS: 125.0000 ug | ORAL_TABLET | Freq: Every day | ORAL | 3 refills | Status: DC

## 2019-04-13 NOTE — Progress Notes (Signed)
Patient ID: Danielle Khan, female   DOB: 1970-11-14, 48 y.o.   MRN: [id number]      Reason for Appointment:  Hypothyroidism, followup visit    History of Present Illness:   The patient was first seen in consultation in 03/2012 for management of her hyperthyroidism. She had had Graves' disease since the year 2001 and had been on long-term methimazole treatment but had not been on any dictations for 2 years prior to evaluation. Because of persistent hyperthyroidism and a free T4 level of 1.65 she was given 16.75 mCi of I-131 on 05/02/12  When seen for followup in 3/15 she was significantly hypothyroid with symptoms of feeling less cold, tired and  some hair shedding.  She initially given 150 mcg on her first visit    However because of relatively low TSH in 6/15  her dose was reduced to 125 mcg but subsequently went back to 150 mcg  RECENT HISTORY:  She has been on the 150 g levothyroxine dose, was told to take 6-1/2 tablets a week since 04/2015 However she had not been seen in follow-up between 09/2016 and  6/20  Subsequently her doses has been changed consistently This is despite her trying to take her supplement more regularly  She was mildly hypothyroid in June and complaining of feeling cold and tired as well as having some hair loss However subsequently she has needed progressively lower doses  Currently she is taking  half tablet per week on Wednesdays with 1 tablet on the other days since her visit in 02/2013 She does not feel any different and having no heat or cold intolerance, hair loss or fatigue  However she is starting to lose weight with significantly changing her diet  TSH is again relatively low at 0.13 compared to 0.105  Previously had upper normal free T4  Lab Results  Component Value Date   TSH 0.13 (L) 04/10/2019   TSH 0.105 (L) 02/20/2019   TSH 4.70 (H) 11/11/2018   FREET4 1.58 02/20/2019   FREET4 1.00 11/11/2018   FREET4  1.20 09/24/2016    Wt Readings from Last 3 Encounters:  04/13/19 235 lb (106.6 kg)  03/02/19 241 lb 6.4 oz (109.5 kg)  10/30/18 242 lb 12.8 oz (110.1 kg)     Allergies as of 04/13/2019   No Known Allergies     Medication List       Accurate as of April 13, 2019  4:07 PM. If you have any questions, ask your nurse or doctor.        atenolol 50 MG tablet Commonly known as: TENORMIN Take 1 tablet (50 mg total) by mouth daily.   levothyroxine 150 MCG tablet Commonly known as: SYNTHROID TAKE 1 TABLET BY MOUTH ONCE DAILY AND 1 & 1/2 (ONE & ONE-HALF) EVERY SUNDAY What changed: additional instructions   triamcinolone 55 MCG/ACT Aero nasal inhaler Commonly known as: Nasacort AQ Place 2 sprays into the nose daily as needed.       Allergies: No Known Allergies  Past Medical History:  Diagnosis Date  . Hypertension   . Thyroid disease     Past Surgical History:  Procedure Laterality Date  . ABDOMINAL HYSTERECTOMY      Family History  Problem Relation Age of Onset  . Breast cancer Maternal Aunt     Social History:  reports that she has never smoked. Her smokeless tobacco use includes snuff. She reports that she does not drink alcohol or use drugs.  REVIEW Of SYSTEMS:  Weight history:  Wt Readings from Last 3 Encounters:  04/13/19 235 lb (106.6 kg)  03/02/19 241 lb 6.4 oz (109.5 kg)  10/30/18 242 lb 12.8 oz (110.1 kg)    Hypertension: She has had difficulty with blood pressure control until recently  Followed by PCP and restarted on atenolol but not chlorthalidone back in 5/20, has not had a follow-up visit with him Recently has cut back on portions, high sodium foods and blood pressure is improving   BP Readings from Last 3 Encounters:  04/13/19 126/84  03/02/19 140/90  10/30/18 (!) 175/94   She has been told to have prediabetes and has not scheduled with her PCP for follow-up  Lab Results  Component Value Date   HGBA1C 6.0 (H) 11/26/2016   Lab  Results  Component Value Date   MICROALBUR 1.56 08/15/2013   LDLCALC 133 (H) 09/04/2013   CREATININE 0.91 11/26/2016     Examination:   BP 126/84 (BP Location: Left Arm, Patient Position: Sitting, Cuff Size: Large)   Pulse 74   Ht 5\' 7"  (1.702 m)   Wt 235 lb (106.6 kg)   SpO2 98%   BMI 36.81 kg/m    She looks well     Assessment   Hypothyroidism, post ablative for several years  Although she is not symptomatic appears that she is needing lower doses of levothyroxine This is likely to be from much better compliance with her therapy She is also losing weight and is down 6 pounds since last visit last month  Currently her TSH is still slightly low with 6-1/2 tablets a week of the 150 mcg dose which is equivalent to about 139 mcg daily  HYPERTENSION: Better controlled now, she is being treated by her PCP   Treatment:  She will go down to 125 mcg levothyroxine and will prescribe Euthyrox brand to get from her [place]  She will get her TSH checked in 6 weeks and come back in follow-up in 4 months to make sure she has normal labs consistently  She will follow-up with PCP for management of her hypertension and prediabetes She declined a flu shot again  [name] 04/13/2019, 4:07 PM

## 2019-05-26 ENCOUNTER — Other Ambulatory Visit: Payer: BC Managed Care – PPO

## 2019-05-27 ENCOUNTER — Other Ambulatory Visit (INDEPENDENT_AMBULATORY_CARE_PROVIDER_SITE_OTHER): Payer: BC Managed Care – PPO

## 2019-05-27 ENCOUNTER — Telehealth (INDEPENDENT_AMBULATORY_CARE_PROVIDER_SITE_OTHER): Payer: BC Managed Care – PPO | Admitting: Emergency Medicine

## 2019-05-27 ENCOUNTER — Encounter: Payer: Self-pay | Admitting: Emergency Medicine

## 2019-05-27 ENCOUNTER — Other Ambulatory Visit: Payer: Self-pay

## 2019-05-27 VITALS — Ht 67.0 in | Wt 239.0 lb

## 2019-05-27 DIAGNOSIS — Z20828 Contact with and (suspected) exposure to other viral communicable diseases: Secondary | ICD-10-CM

## 2019-05-27 DIAGNOSIS — R7303 Prediabetes: Secondary | ICD-10-CM

## 2019-05-27 DIAGNOSIS — E890 Postprocedural hypothyroidism: Secondary | ICD-10-CM

## 2019-05-27 DIAGNOSIS — J22 Unspecified acute lower respiratory infection: Secondary | ICD-10-CM | POA: Diagnosis not present

## 2019-05-27 DIAGNOSIS — Z20822 Contact with and (suspected) exposure to covid-19: Secondary | ICD-10-CM

## 2019-05-27 DIAGNOSIS — R059 Cough, unspecified: Secondary | ICD-10-CM

## 2019-05-27 DIAGNOSIS — R05 Cough: Secondary | ICD-10-CM | POA: Diagnosis not present

## 2019-05-27 LAB — T4, FREE: Free T4: 1.11 ng/dL (ref 0.60–1.60)

## 2019-05-27 LAB — GLUCOSE, RANDOM: Glucose, Bld: 134 mg/dL — ABNORMAL HIGH (ref 70–99)

## 2019-05-27 LAB — TSH: TSH: 1.34 u[IU]/mL (ref 0.35–4.50)

## 2019-05-27 MED ORDER — HYDROCODONE-HOMATROPINE 5-1.5 MG/5ML PO SYRP: 5.0000 mL | ORAL_SOLUTION | Freq: Every evening | ORAL | 0 refills | Status: DC | PRN

## 2019-05-27 MED ORDER — BENZONATATE 200 MG PO CAPS: 200.0000 mg | ORAL_CAPSULE | Freq: Two times a day (BID) | ORAL | 0 refills | Status: DC | PRN

## 2019-05-27 MED ORDER — AZITHROMYCIN 250 MG PO TABS: ORAL_TABLET | ORAL | 0 refills | Status: DC

## 2019-05-27 NOTE — Progress Notes (Signed)
Telemedicine Encounter- SOAP NOTE Established Patient  This telephone encounter was conducted with the patient's (or proxy's) verbal consent via audio telecommunications: yes/no: Yes Patient was instructed to have this encounter in a suitably private space; and to only have persons present to whom they give permission to participate. In addition, patient identity was confirmed by use of name plus two identifiers (DOB and address).  I discussed the limitations, risks, security and privacy concerns of performing an evaluation and management service by telephone and the availability of in person appointments. I also discussed with the patient that there may be a patient responsible charge related to this service. The patient expressed understanding and agreed to proceed.  I spent a total of TIME; 0 MIN TO 60 MIN: 15 minutes talking with the patient or their proxy.  Chief Complaint  Patient presents with  . Cough    Pt stated--coughing light green thick mucus, wheezing, congested--1 week. Denied fever. COVID tested -Monday at work site--still awaiting for results. pt requesting medication for cough.    Subjective   Danielle Khan is a 48 y.o. female established patient. Telephone visit today complaining of flulike symptoms including productive cough for 2 weeks, congestion, runny nose and eyes, and general achiness.  Was tested for Covid 2 days ago but results not back yet.  No other significant symptoms.  Denies difficulty breathing, wheezing, chest pain, high fever or chills.  Able to eat and drink.  Denies nausea or vomiting.  Denies abdominal pain or diarrhea.  Denies skin rash.  Sense of smell and taste still intact.  HPI   Patient Active Problem List   Diagnosis Date Noted  . 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 Hypertension 08/19/2013    Past Medical History:  Diagnosis Date  . Hypertension   .  Thyroid disease     Current Outpatient Medications  Medication Sig Dispense Refill  . atenolol (TENORMIN) 50 MG tablet Take 1 tablet (50 mg total) by mouth daily. 90 tablet 3  . EUTHYROX 125 MCG tablet Take 1 tablet (125 mcg total) by mouth daily before breakfast. 30 tablet 3  . azithromycin (ZITHROMAX) 250 MG tablet Sig as indicated 6 tablet 0  . HYDROcodone-homatropine (HYCODAN) 5-1.5 MG/5ML syrup Take 5 mLs by mouth at bedtime as needed for cough. 120 mL 0  . triamcinolone (NASACORT AQ) 55 MCG/ACT AERO nasal inhaler Place 2 sprays into the nose daily as needed. (Patient not taking: Reported on 05/27/2019) 1 Inhaler 5   No current facility-administered medications for this visit.     No Known Allergies  Social History   Socioeconomic History  . Marital status: Married    Spouse name: Not on file  . Number of children: Not on file  . Years of education: Not on file  . Highest education level: Not on file  Occupational History  . Not on file  Social Needs  . Financial resource strain: Not on file  . Food insecurity    Worry: Not on file    Inability: Not on file  . Transportation needs    Medical: Not on file    Non-medical: Not on file  Tobacco Use  . Smoking status: Never Smoker  . Smokeless tobacco: Current User    Types: Snuff  Substance and Sexual Activity  . Alcohol use: No    Alcohol/week: 0.0 standard drinks  . Drug use: No  . Sexual activity: Yes  Lifestyle  .  Physical activity    Days per week: Not on file    Minutes per session: Not on file  . Stress: Not on file  Relationships  . Social Musician on phone: Not on file    Gets together: Not on file    Attends religious service: Not on file    Active member of club or organization: Not on file    Attends meetings of clubs or organizations: Not on file    Relationship status: Not on file  . Intimate partner violence    Fear of current or ex partner: Not on file    Emotionally abused: Not on  file    Physically abused: Not on file    Forced sexual activity: Not on file  Other Topics Concern  . Not on file  Social History Narrative  . Not on file    Review of Systems  Constitutional: Positive for malaise/fatigue. Negative for chills and fever.  HENT: Positive for congestion. Negative for sore throat.   Respiratory: Positive for cough and sputum production. Negative for shortness of breath and wheezing.   Cardiovascular: Negative.  Negative for chest pain and palpitations.  Gastrointestinal: Negative.  Negative for abdominal pain, diarrhea, nausea and vomiting.  Genitourinary: Negative.  Negative for dysuria and hematuria.  Musculoskeletal: Negative.   Skin: Negative.  Negative for rash.  Neurological: Negative for dizziness and headaches.  All other systems reviewed and are negative.   Objective  Alert and oriented x3 in no apparent respiratory distress.  Vitals as reported by the patient: Today's Vitals   05/27/19 1647  Weight: 239 lb (108.4 kg)  Height: 5\' 7"  (1.702 m)    Lakysha was seen today for cough.  Diagnoses and all orders for this visit:  Cough -     HYDROcodone-homatropine (HYCODAN) 5-1.5 MG/5ML syrup; Take 5 mLs by mouth at bedtime as needed for cough.  Lower respiratory infection -     azithromycin (ZITHROMAX) 250 MG tablet; Sig as indicated  Suspected COVID-19 virus infection  Clinically stable.  No red flag signs or symptoms.  Covid instructions and ED precautions given.  Advised to contact the office if clinical condition changes or symptoms worsen.   I discussed the assessment and treatment plan with the patient. The patient was provided an opportunity to ask questions and all were answered. The patient agreed with the plan and demonstrated an understanding of the instructions.   The patient was advised to call back or seek an in-person evaluation if the symptoms worsen or if the condition fails to improve as anticipated.  I provided 15  minutes of non-face-to-face time during this encounter.  Aram Beecham, MD  Primary Care at [facility]

## 2019-05-29 NOTE — Progress Notes (Signed)
Please call to let patient know that the thyroid  results are normal and no further action needed

## 2019-06-01 ENCOUNTER — Telehealth: Payer: Self-pay | Admitting: Emergency Medicine

## 2019-06-01 NOTE — Telephone Encounter (Signed)
Pt states that [facility] doesn't have HYDROcodone-homatropine (HYCODAN) 5-1.5 MG/5ML syrup in stock and she needs to have it called in to [id number] [address], [place] Phone:  [phone]  Fax:  [phone]

## 2019-06-02 ENCOUNTER — Other Ambulatory Visit: Payer: Self-pay | Admitting: Emergency Medicine

## 2019-06-02 DIAGNOSIS — R059 Cough, unspecified: Secondary | ICD-10-CM

## 2019-06-02 DIAGNOSIS — R05 Cough: Secondary | ICD-10-CM

## 2019-06-02 MED ORDER — HYDROCODONE-HOMATROPINE 5-1.5 MG/5ML PO SYRP: 5.0000 mL | ORAL_SOLUTION | Freq: Every evening | ORAL | 0 refills | Status: DC | PRN

## 2019-06-02 NOTE — Telephone Encounter (Signed)
Sent. Thanks.   

## 2019-06-22 ENCOUNTER — Telehealth: Payer: Self-pay | Admitting: Emergency Medicine

## 2019-06-22 NOTE — Telephone Encounter (Signed)
Pt called in to report she is coughing with clear phlegm. Her stomach hurts from the coughing. She slightly wheezes at night. She continues to take the syrup as needed, but wanted pcp to know.  Pt scheduled cpe on 1/06

## 2019-06-24 ENCOUNTER — Encounter: Payer: Self-pay | Admitting: Gastroenterology

## 2019-06-24 ENCOUNTER — Encounter: Payer: Self-pay | Admitting: Emergency Medicine

## 2019-06-24 ENCOUNTER — Ambulatory Visit (INDEPENDENT_AMBULATORY_CARE_PROVIDER_SITE_OTHER): Payer: BC Managed Care – PPO | Admitting: Emergency Medicine

## 2019-06-24 ENCOUNTER — Other Ambulatory Visit: Payer: Self-pay

## 2019-06-24 VITALS — BP 140/86 | HR 78 | Temp 97.9°F | Ht 67.0 in | Wt 233.0 lb

## 2019-06-24 DIAGNOSIS — Z1329 Encounter for screening for other suspected endocrine disorder: Secondary | ICD-10-CM

## 2019-06-24 DIAGNOSIS — Z130 Encounter for screening for diseases of the blood and blood-forming organs and certain disorders involving the immune mechanism: Secondary | ICD-10-CM

## 2019-06-24 DIAGNOSIS — E890 Postprocedural hypothyroidism: Secondary | ICD-10-CM

## 2019-06-24 DIAGNOSIS — Z23 Encounter for immunization: Secondary | ICD-10-CM | POA: Diagnosis not present

## 2019-06-24 DIAGNOSIS — R05 Cough: Secondary | ICD-10-CM

## 2019-06-24 DIAGNOSIS — Z0000 Encounter for general adult medical examination without abnormal findings: Secondary | ICD-10-CM

## 2019-06-24 DIAGNOSIS — Z0001 Encounter for general adult medical examination with abnormal findings: Secondary | ICD-10-CM | POA: Diagnosis not present

## 2019-06-24 DIAGNOSIS — J988 Other specified respiratory disorders: Secondary | ICD-10-CM | POA: Diagnosis not present

## 2019-06-24 DIAGNOSIS — Z1211 Encounter for screening for malignant neoplasm of colon: Secondary | ICD-10-CM

## 2019-06-24 DIAGNOSIS — I10 Essential (primary) hypertension: Secondary | ICD-10-CM

## 2019-06-24 DIAGNOSIS — R059 Cough, unspecified: Secondary | ICD-10-CM

## 2019-06-24 DIAGNOSIS — Z13228 Encounter for screening for other metabolic disorders: Secondary | ICD-10-CM

## 2019-06-24 DIAGNOSIS — Z13220 Encounter for screening for lipoid disorders: Secondary | ICD-10-CM

## 2019-06-24 DIAGNOSIS — B9789 Other viral agents as the cause of diseases classified elsewhere: Secondary | ICD-10-CM

## 2019-06-24 MED ORDER — LOSARTAN POTASSIUM 50 MG PO TABS: 50.0000 mg | ORAL_TABLET | Freq: Every day | ORAL | 3 refills | Status: DC

## 2019-06-24 NOTE — Patient Instructions (Addendum)
Take Metamucil daily for chronic constipation. Take Mucinex DM as needed for cough. Continue atenolol 50 mg daily and start losartan 50 mg daily.  Health Maintenance, Female Adopting a healthy lifestyle and getting preventive care are important in promoting health and wellness. Ask your health care provider about:  The right schedule for you to have regular tests and exams.  Things you can do on your own to prevent diseases and keep yourself healthy. What should I know about diet, weight, and exercise? Eat a healthy diet   Eat a diet that includes plenty of vegetables, fruits, low-fat dairy products, and lean protein.  Do not eat a lot of foods that are high in solid fats, added sugars, or sodium. Maintain a healthy weight Body mass index (BMI) is used to identify weight problems. It estimates body fat based on height and weight. Your health care provider can help determine your BMI and help you achieve or maintain a healthy weight. Get regular exercise Get regular exercise. This is one of the most important things you can do for your health. Most adults should:  Exercise for at least 150 minutes each week. The exercise should increase your heart rate and make you sweat (moderate-intensity exercise).  Do strengthening exercises at least twice a week. This is in addition to the moderate-intensity exercise.  Spend less time sitting. Even light physical activity can be beneficial. Watch cholesterol and blood lipids Have your blood tested for lipids and cholesterol at 50 years of age, then have this test every 5 years. Have your cholesterol levels checked more often if:  Your lipid or cholesterol levels are high.  You are older than 50 years of age.  You are at high risk for heart disease. What should I know about cancer screening? Depending on your health history and family history, you may need to have cancer screening at various ages. This may include screening for:  Breast  cancer.  Cervical cancer.  Colorectal cancer.  Skin cancer.  Lung cancer. What should I know about heart disease, diabetes, and high blood pressure? Blood pressure and heart disease  High blood pressure causes heart disease and increases the risk of stroke. This is more likely to develop in people who have high blood pressure readings, are of African descent, or are overweight.  Have your blood pressure checked: ? Every 3-5 years if you are 34-72 years of age. ? Every year if you are 66 years old or older. Diabetes Have regular diabetes screenings. This checks your fasting blood sugar level. Have the screening done:  Once every three years after age 37 if you are at a normal weight and have a low risk for diabetes.  More often and at a younger age if you are overweight or have a high risk for diabetes. What should I know about preventing infection? Hepatitis B If you have a higher risk for hepatitis B, you should be screened for this virus. Talk with your health care provider to find out if you are at risk for hepatitis B infection. Hepatitis C Testing is recommended for:  Everyone born from [birth year] through 1966.  Anyone with known risk factors for hepatitis C. Sexually transmitted infections (STIs)  Get screened for STIs, including gonorrhea and chlamydia, if: ? You are sexually active and are younger than 50 years of age. ? You are older than 50 years of age and your health care provider tells you that you are at risk for this type of infection. ? Your  sexual activity has changed since you were last screened, and you are at increased risk for chlamydia or gonorrhea. Ask your health care provider if you are at risk.  Ask your health care provider about whether you are at high risk for HIV. Your health care provider may recommend a prescription medicine to help prevent HIV infection. If you choose to take medicine to prevent HIV, you should first get tested for HIV. You should  then be tested every 3 months for as long as you are taking the medicine. Pregnancy  If you are about to stop having your period (premenopausal) and you may become pregnant, seek counseling before you get pregnant.  Take 400 to 800 micrograms (mcg) of folic acid every day if you become pregnant.  Ask for birth control (contraception) if you want to prevent pregnancy. Osteoporosis and menopause Osteoporosis is a disease in which the bones lose minerals and strength with aging. This can result in bone fractures. If you are 73 years old or older, or if you are at risk for osteoporosis and fractures, ask your health care provider if you should:  Be screened for bone loss.  Take a calcium or vitamin D supplement to lower your risk of fractures.  Be given hormone replacement therapy (HRT) to treat symptoms of menopause. Follow these instructions at home: Lifestyle  Do not use any products that contain nicotine or tobacco, such as cigarettes, e-cigarettes, and chewing tobacco. If you need help quitting, ask your health care provider.  Do not use street drugs.  Do not share needles.  Ask your health care provider for help if you need support or information about quitting drugs. Alcohol use  Do not drink alcohol if: ? Your health care provider tells you not to drink. ? You are pregnant, may be pregnant, or are planning to become pregnant.  If you drink alcohol: ? Limit how much you use to 0-1 drink a day. ? Limit intake if you are breastfeeding.  Be aware of how much alcohol is in your drink. In the [place] one drink equals one 12 oz bottle of beer (355 mL), one 5 oz glass of wine (148 mL), or one 1 oz glass of hard liquor (44 mL). General instructions  Schedule regular health, dental, and eye exams.  Stay current with your vaccines.  Tell your health care provider if: ? You often feel depressed. ? You have ever been abused or do not feel safe at home. Summary  Adopting a  healthy lifestyle and getting preventive care are important in promoting health and wellness.  Follow your health care provider's instructions about healthy diet, exercising, and getting tested or screened for diseases.  Follow your health care provider's instructions on monitoring your cholesterol and blood pressure. This information is not intended to replace advice given to you by your health care provider. Make sure you discuss any questions you have with your health care provider. Document Revised: 05/28/2018 Document Reviewed: 05/28/2018 Elsevier Patient Education  2020 [employer].

## 2019-06-24 NOTE — Progress Notes (Signed)
[name] [name] 49 y.o.   No chief complaint on file.   HISTORY OF PRESENT ILLNESS: This is a 49 y.o. female here for annual exam. 1.  History of hypertension: On atenolol 50 mg daily.  Blood pressure readings at work have been on the high side.  We will add losartan 50 mg daily. 2.  History of hypothyroidism: Levothyroxine dose recently reduced to 125 mcg/day.  Doing well. 3.  History of hysterectomy 4.  Needs colonoscopy.  History of chronic constipation.  Advised to use Metamucil daily. 5.  Recent flu like illness.  Covid test negative.  Still has some persistent dry cough.   HPI   Prior to Admission medications   Medication Sig Start Date End Date Taking? Authorizing Provider  atenolol (TENORMIN) 50 MG tablet Take 1 tablet (50 mg total) by mouth daily. 10/30/18 05/27/19  Georgina Quint, MD  azithromycin [facility]) 250 MG tablet Sig as indicated 05/27/19   Georgina Quint, MD  benzonatate (TESSALON) 200 MG capsule Take 1 capsule (200 mg total) by mouth 2 (two) times daily as needed for cough. 05/27/19   Georgina Quint, MD  EUTHYROX 125 MCG tablet Take 1 tablet (125 mcg total) by mouth daily before breakfast. 04/13/19   Reather Littler, MD  HYDROcodone-homatropine [facility]) 5-1.5 MG/5ML syrup Take 5 mLs by mouth at bedtime as needed for cough. 06/02/19   Georgina Quint, MD  triamcinolone (NASACORT AQ) 55 MCG/ACT AERO nasal inhaler Place 2 sprays into the nose daily as needed. Patient not taking: Reported on 05/27/2019 06/09/18   Peyton Najjar, MD    No Known Allergies  Patient Active Problem List   Diagnosis Date Noted  . 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 Hypertension 08/19/2013    Past Medical History:  Diagnosis Date  . Hypertension   . Thyroid disease     Past Surgical History:  Procedure Laterality Date  . ABDOMINAL HYSTERECTOMY      Social History    Socioeconomic History  . Marital status: Married    Spouse name: Not on file  . Number of children: Not on file  . Years of education: Not on file  . Highest education level: Not on file  Occupational History  . Not on file  Tobacco Use  . Smoking status: Never Smoker  . Smokeless tobacco: Current User    Types: Snuff  Substance and Sexual Activity  . Alcohol use: No    Alcohol/week: 0.0 standard drinks  . Drug use: No  . Sexual activity: Yes  Other Topics Concern  . Not on file  Social History Narrative  . Not on file   Social Determinants of Health   Financial Resource Strain:   . Difficulty of Paying Living Expenses: Not on file  Food Insecurity:   . Worried About Programme researcher, broadcasting/film/video in the Last Year: Not on file  . Ran Out of Food in the Last Year: Not on file  Transportation Needs:   . Lack of Transportation (Medical): Not on file  . Lack of Transportation (Non-Medical): Not on file  Physical Activity:   . Days of Exercise per Week: Not on file  . Minutes of Exercise per Session: Not on file  Stress:   . Feeling of Stress : Not on file  Social Connections:   . Frequency of Communication with Friends and Family: Not on file  . Frequency of Social Gatherings with Friends and Family: Not  on file  . Attends Religious Services: Not on file  . Active Member of Clubs or Organizations: Not on file  . Attends Archivist Meetings: Not on file  . Marital Status: Not on file  Intimate Partner Violence:   . Fear of Current or Ex-Partner: Not on file  . Emotionally Abused: Not on file  . Physically Abused: Not on file  . Sexually Abused: Not on file    Family History  Problem Relation Age of Onset  . Breast cancer Maternal Aunt      Review of Systems  Constitutional: Negative.  Negative for chills and fever.  HENT: Negative.  Negative for congestion and sore throat.   Eyes: Negative.   Respiratory: Positive for cough. Negative for shortness of breath.    Cardiovascular: Negative.  Negative for chest pain and palpitations.  Gastrointestinal: Positive for constipation. Negative for abdominal pain, blood in stool, diarrhea, melena, nausea and vomiting.  Genitourinary: Negative.  Negative for dysuria and hematuria.  Musculoskeletal: Negative.  Negative for back pain, myalgias and neck pain.  Skin: Negative.  Negative for rash.  Neurological: Negative.  Negative for dizziness and headaches.  Endo/Heme/Allergies: Negative.   All other systems reviewed and are negative.  Vitals:   06/24/19 0900 06/24/19 0901  BP: (!) 167/100 140/86  Pulse: 78   Temp: 97.9 F (36.6 C)   SpO2: 96%      Physical Exam Vitals reviewed.  Constitutional:      Appearance: Normal appearance.  HENT:     Head: Normocephalic.  Eyes:     Extraocular Movements: Extraocular movements intact.     Conjunctiva/sclera: Conjunctivae normal.     Pupils: Pupils are equal, round, and reactive to light.  Cardiovascular:     Rate and Rhythm: Normal rate and regular rhythm.     Pulses: Normal pulses.     Heart sounds: Normal heart sounds.  Pulmonary:     Effort: Pulmonary effort is normal.     Breath sounds: Normal breath sounds.  Abdominal:     General: Bowel sounds are normal. There is no distension.     Palpations: Abdomen is soft.     Tenderness: There is no abdominal tenderness.  Musculoskeletal:        General: Normal range of motion.     Cervical back: Normal range of motion and neck supple.  Skin:    General: Skin is warm and dry.     Capillary Refill: Capillary refill takes less than 2 seconds.  Neurological:     General: No focal deficit present.     Mental Status: She is alert and oriented to person, place, and time.  Psychiatric:        Mood and Affect: Mood normal.        Behavior: Behavior normal.      ASSESSMENT & PLAN: Danielle Khan was seen today for annual exam and cough.  Diagnoses and all orders for this visit:  Routine general medical  examination at a health care facility  Uncontrolled hypertension -     Comprehensive metabolic panel -     losartan (COZAAR) 50 MG tablet; Take 1 tablet (50 mg total) by mouth daily.  Hypothyroidism, postradioiodine therapy -     TSH  Colon cancer screening -     Ambulatory referral to Gastroenterology  Screening for deficiency anemia -     CBC with Differential  Screening for lipoid disorders -     Lipid panel  Screening for endocrine, metabolic  and immunity disorder -     Comprehensive metabolic panel -     Hemoglobin A1c  Need for immunization against influenza -     Influenza (Seasonal)  Cough  Viral respiratory infection    Patient Instructions  Take Metamucil daily for chronic constipation. Take Mucinex DM as needed for cough.   Health Maintenance, Female Adopting a healthy lifestyle and getting preventive care are important in promoting health and wellness. Ask your health care provider about:  The right schedule for you to have regular tests and exams.  Things you can do on your own to prevent diseases and keep yourself healthy. What should I know about diet, weight, and exercise? Eat a healthy diet   Eat a diet that includes plenty of vegetables, fruits, low-fat dairy products, and lean protein.  Do not eat a lot of foods that are high in solid fats, added sugars, or sodium. Maintain a healthy weight Body mass index (BMI) is used to identify weight problems. It estimates body fat based on height and weight. Your health care provider can help determine your BMI and help you achieve or maintain a healthy weight. Get regular exercise Get regular exercise. This is one of the most important things you can do for your health. Most adults should:  Exercise for at least 150 minutes each week. The exercise should increase your heart rate and make you sweat (moderate-intensity exercise).  Do strengthening exercises at least twice a week. This is in addition to  the moderate-intensity exercise.  Spend less time sitting. Even light physical activity can be beneficial. Watch cholesterol and blood lipids Have your blood tested for lipids and cholesterol at 50 years of age, then have this test every 5 years. Have your cholesterol levels checked more often if:  Your lipid or cholesterol levels are high.  You are older than 50 years of age.  You are at high risk for heart disease. What should I know about cancer screening? Depending on your health history and family history, you may need to have cancer screening at various ages. This may include screening for:  Breast cancer.  Cervical cancer.  Colorectal cancer.  Skin cancer.  Lung cancer. What should I know about heart disease, diabetes, and high blood pressure? Blood pressure and heart disease  High blood pressure causes heart disease and increases the risk of stroke. This is more likely to develop in people who have high blood pressure readings, are of African descent, or are overweight.  Have your blood pressure checked: ? Every 3-5 years if you are 43-68 years of age. ? Every year if you are 50 years old or older. Diabetes Have regular diabetes screenings. This checks your fasting blood sugar level. Have the screening done:  Once every three years after age 74 if you are at a normal weight and have a low risk for diabetes.  More often and at a younger age if you are overweight or have a high risk for diabetes. What should I know about preventing infection? Hepatitis B If you have a higher risk for hepatitis B, you should be screened for this virus. Talk with your health care provider to find out if you are at risk for hepatitis B infection. Hepatitis C Testing is recommended for:  Everyone born from [birth year] through 1966.  Anyone with known risk factors for hepatitis C. Sexually transmitted infections (STIs)  Get screened for STIs, including gonorrhea and chlamydia, if: ? You  are sexually active and are  younger than 50 years of age. ? You are older than 50 years of age and your health care provider tells you that you are at risk for this type of infection. ? Your sexual activity has changed since you were last screened, and you are at increased risk for chlamydia or gonorrhea. Ask your health care provider if you are at risk.  Ask your health care provider about whether you are at high risk for HIV. Your health care provider may recommend a prescription medicine to help prevent HIV infection. If you choose to take medicine to prevent HIV, you should first get tested for HIV. You should then be tested every 3 months for as long as you are taking the medicine. Pregnancy  If you are about to stop having your period (premenopausal) and you may become pregnant, seek counseling before you get pregnant.  Take 400 to 800 micrograms (mcg) of folic acid every day if you become pregnant.  Ask for birth control (contraception) if you want to prevent pregnancy. Osteoporosis and menopause Osteoporosis is a disease in which the bones lose minerals and strength with aging. This can result in bone fractures. If you are 32 years old or older, or if you are at risk for osteoporosis and fractures, ask your health care provider if you should:  Be screened for bone loss.  Take a calcium or vitamin D supplement to lower your risk of fractures.  Be given hormone replacement therapy (HRT) to treat symptoms of menopause. Follow these instructions at home: Lifestyle  Do not use any products that contain nicotine or tobacco, such as cigarettes, e-cigarettes, and chewing tobacco. If you need help quitting, ask your health care provider.  Do not use street drugs.  Do not share needles.  Ask your health care provider for help if you need support or information about quitting drugs. Alcohol use  Do not drink alcohol if: ? Your health care provider tells you not to drink. ? You are  pregnant, may be pregnant, or are planning to become pregnant.  If you drink alcohol: ? Limit how much you use to 0-1 drink a day. ? Limit intake if you are breastfeeding.  Be aware of how much alcohol is in your drink. In the [place] one drink equals one 12 oz bottle of beer (355 mL), one 5 oz glass of wine (148 mL), or one 1 oz glass of hard liquor (44 mL). General instructions  Schedule regular health, dental, and eye exams.  Stay current with your vaccines.  Tell your health care provider if: ? You often feel depressed. ? You have ever been abused or do not feel safe at home. Summary  Adopting a healthy lifestyle and getting preventive care are important in promoting health and wellness.  Follow your health care provider's instructions about healthy diet, exercising, and getting tested or screened for diseases.  Follow your health care provider's instructions on monitoring your cholesterol and blood pressure. This information is not intended to replace advice given to you by your health care provider. Make sure you discuss any questions you have with your health care provider. Document Revised: 05/28/2018 Document Reviewed: 05/28/2018 Elsevier Patient Education  2020 [employer]      Edwina Barth, MD [facility]

## 2019-06-25 ENCOUNTER — Encounter: Payer: Self-pay | Admitting: Emergency Medicine

## 2019-06-25 LAB — COMPREHENSIVE METABOLIC PANEL
ALT: 9 IU/L (ref 0–32)
AST: 23 IU/L (ref 0–40)
Albumin/Globulin Ratio: 1.3 (ref 1.2–2.2)
Albumin: 4 g/dL (ref 3.8–4.8)
Alkaline Phosphatase: 160 IU/L — ABNORMAL HIGH (ref 39–117)
BUN/Creatinine Ratio: 11 (ref 9–23)
BUN: 10 mg/dL (ref 6–24)
Bilirubin Total: 0.6 mg/dL (ref 0.0–1.2)
CO2: 22 mmol/L (ref 20–29)
Calcium: 9 mg/dL (ref 8.7–10.2)
Chloride: 102 mmol/L (ref 96–106)
Creatinine, Ser: 0.89 mg/dL (ref 0.57–1.00)
GFR calc Af Amer: 88 mL/min/{1.73_m2} (ref >59)
GFR calc non Af Amer: 76 mL/min/{1.73_m2} (ref >59)
Globulin, Total: 3.2 g/dL (ref 1.5–4.5)
Glucose: 119 mg/dL — ABNORMAL HIGH (ref 65–99)
Potassium: 4.8 mmol/L (ref 3.5–5.2)
Sodium: 137 mmol/L (ref 134–144)
Total Protein: 7.2 g/dL (ref 6.0–8.5)

## 2019-06-25 LAB — CBC WITH DIFFERENTIAL/PLATELET
Basophils Absolute: 0 10*3/uL (ref 0.0–0.2)
Basos: 0 %
EOS (ABSOLUTE): 0.2 10*3/uL (ref 0.0–0.4)
Eos: 4 %
Hematocrit: 32 % — ABNORMAL LOW (ref 34.0–46.6)
Hemoglobin: 11 g/dL — ABNORMAL LOW (ref 11.1–15.9)
Immature Grans (Abs): 0 10*3/uL (ref 0.0–0.1)
Immature Granulocytes: 0 %
Lymphocytes Absolute: 1.8 10*3/uL (ref 0.7–3.1)
Lymphs: 26 %
MCH: 34.5 pg — ABNORMAL HIGH (ref 26.6–33.0)
MCHC: 34.4 g/dL (ref 31.5–35.7)
MCV: 100 fL — ABNORMAL HIGH (ref 79–97)
Monocytes Absolute: 0.6 10*3/uL (ref 0.1–0.9)
Monocytes: 8 %
Neutrophils Absolute: 4.1 10*3/uL (ref 1.4–7.0)
Neutrophils: 62 %
Platelets: 354 10*3/uL (ref 150–450)
RBC: 3.19 x10E6/uL — ABNORMAL LOW (ref 3.77–5.28)
RDW: 14.6 % (ref 11.7–15.4)
WBC: 6.7 10*3/uL (ref 3.4–10.8)

## 2019-06-25 LAB — LIPID PANEL
Chol/HDL Ratio: 4.1 ratio (ref 0.0–4.4)
Cholesterol, Total: 175 mg/dL (ref 100–199)
HDL: 43 mg/dL (ref >39)
LDL Chol Calc (NIH): 114 mg/dL — ABNORMAL HIGH (ref 0–99)
Triglycerides: 98 mg/dL (ref 0–149)
VLDL Cholesterol Cal: 18 mg/dL (ref 5–40)

## 2019-06-25 LAB — HEMOGLOBIN A1C
Est. average glucose Bld gHb Est-mCnc: 131 mg/dL
Hgb A1c MFr Bld: 6.2 % — ABNORMAL HIGH (ref 4.8–5.6)

## 2019-06-25 LAB — TSH: TSH: 0.612 u[IU]/mL (ref 0.450–4.500)

## 2019-06-26 NOTE — Telephone Encounter (Signed)
Patient concerns were addressed @ [facility] visit 06/24/19

## 2019-06-29 ENCOUNTER — Other Ambulatory Visit: Payer: BC Managed Care – PPO

## 2019-07-02 ENCOUNTER — Ambulatory Visit: Payer: BC Managed Care – PPO | Admitting: Endocrinology

## 2019-07-24 ENCOUNTER — Telehealth: Payer: Self-pay | Admitting: *Deleted

## 2019-07-27 NOTE — Telephone Encounter (Signed)
Patient did not return phone call regarding missed pre-visit.  Letter sent advising that colonoscopy was canceled until pre-visit could be rescheduled.

## 2019-08-10 ENCOUNTER — Encounter: Payer: BC Managed Care – PPO | Admitting: Gastroenterology

## 2019-08-11 ENCOUNTER — Other Ambulatory Visit: Payer: Self-pay | Admitting: Endocrinology

## 2019-08-16 ENCOUNTER — Other Ambulatory Visit: Payer: Self-pay | Admitting: Endocrinology

## 2019-08-16 DIAGNOSIS — E890 Postprocedural hypothyroidism: Secondary | ICD-10-CM

## 2019-08-17 ENCOUNTER — Other Ambulatory Visit: Payer: BC Managed Care – PPO

## 2019-08-20 ENCOUNTER — Ambulatory Visit: Payer: BC Managed Care – PPO | Admitting: Endocrinology

## 2019-09-07 ENCOUNTER — Other Ambulatory Visit (INDEPENDENT_AMBULATORY_CARE_PROVIDER_SITE_OTHER): Payer: BC Managed Care – PPO

## 2019-09-07 ENCOUNTER — Other Ambulatory Visit: Payer: Self-pay

## 2019-09-07 DIAGNOSIS — E890 Postprocedural hypothyroidism: Secondary | ICD-10-CM | POA: Diagnosis not present

## 2019-09-08 LAB — T4, FREE: Free T4: 1.22 ng/dL (ref 0.60–1.60)

## 2019-09-08 LAB — TSH: TSH: 0.27 u[IU]/mL — ABNORMAL LOW (ref 0.35–4.50)

## 2019-09-10 ENCOUNTER — Other Ambulatory Visit: Payer: Self-pay

## 2019-09-10 ENCOUNTER — Ambulatory Visit (INDEPENDENT_AMBULATORY_CARE_PROVIDER_SITE_OTHER): Payer: BC Managed Care – PPO | Admitting: Endocrinology

## 2019-09-10 ENCOUNTER — Encounter: Payer: Self-pay | Admitting: Endocrinology

## 2019-09-10 DIAGNOSIS — E890 Postprocedural hypothyroidism: Secondary | ICD-10-CM

## 2019-09-10 MED ORDER — EUTHYROX 125 MCG PO TABS: ORAL_TABLET | ORAL | 1 refills | Status: DC

## 2019-09-10 NOTE — Progress Notes (Signed)
Patient ID: Danielle Khan, female   DOB: 1970-09-15, 49 y.o.   MRN: [id number]   I connected with the above-named patient by video enabled telemedicine application and verified that I am speaking with the correct person. The patient was explained the limitations of evaluation and management by telemedicine and the availability of in person appointments.  Patient also understood that there may be a patient responsible charge related to this service . Location of the patient: Patient's home . Location of the provider: Physician office Only the patient and myself were participating in the encounter The patient understood the above statements and agreed to proceed.    Reason for Appointment:  Hypothyroidism, followup visit    History of Present Illness:   The patient was first seen in consultation in 03/2012 for management of her hyperthyroidism. She had had Graves' disease since the year 2001 and had been on long-term methimazole treatment but had not been on any dictations for 2 years prior to evaluation. Because of persistent hyperthyroidism and a free T4 level of 1.65 she was given 16.75 mCi of I-131 on 05/02/12  When seen for followup in 3/15 she was significantly hypothyroid with symptoms of feeling less cold, tired and  some hair shedding.  She initially given 150 mcg on her first visit    However because of relatively low TSH in 6/15  her dose was reduced to 125 mcg but subsequently went back to 150 mcg  RECENT HISTORY:  She has been on the 150 g levothyroxine dose, was told to take 6-1/2 tablets a week since 04/2015 However she had not been seen in follow-up between 09/2016 and  6/20  Subsequently her doses has been changed on a regular basis This is despite her trying to take her supplement very regularly every morning before breakfast and she is still doing this  When her thyroid levels are low she usually is complaining of feeling cold and tired as  well as having some hair loss  After reducing her dose down to 125 mcg in 03/2019 her TSH came back to normal She is getting Euthyrox brand Has taken her 125 dose daily regularly She feels fairly good usually Her weight seems to be about the same but she has not checked recently  TSH is  relatively low at 0.27 compared to 0.6 previously   Lab Results  Component Value Date   TSH 0.27 (L) 09/07/2019   TSH 0.612 06/24/2019   TSH 1.34 05/27/2019   FREET4 1.22 09/07/2019   FREET4 1.11 05/27/2019   FREET4 1.58 02/20/2019    Wt Readings from Last 3 Encounters:  06/24/19 233 lb (105.7 kg)  05/27/19 239 lb (108.4 kg)  04/13/19 235 lb (106.6 kg)     Allergies as of 09/10/2019   No Known Allergies     Medication List       Accurate as of September 10, 2019  3:08 PM. If you have any questions, ask your nurse or doctor.        atenolol 50 MG tablet Commonly known as: TENORMIN Take 1 tablet (50 mg total) by mouth daily.   azithromycin 250 MG tablet Commonly known as: ZITHROMAX Sig as indicated   benzonatate 200 MG capsule Commonly known as: TESSALON Take 1 capsule (200 mg total) by mouth 2 (two) times daily as needed for cough.   Euthyrox 125 MCG tablet Generic drug: levothyroxine TAKE 1 TABLET BY MOUTH ONCE DAILY BEFORE BREAKFAST   HYDROcodone-homatropine 5-1.5  MG/5ML syrup Commonly known as: HYCODAN Take 5 mLs by mouth at bedtime as needed for cough.   losartan 50 MG tablet Commonly known as: COZAAR Take 1 tablet (50 mg total) by mouth daily.   triamcinolone 55 MCG/ACT Aero nasal inhaler Commonly known as: Nasacort AQ Place 2 sprays into the nose daily as needed.       Allergies: No Known Allergies  Past Medical History:  Diagnosis Date  . Hypertension   . Thyroid disease     Past Surgical History:  Procedure Laterality Date  . ABDOMINAL HYSTERECTOMY      Family History  Problem Relation Age of Onset  . Breast cancer Maternal Aunt     Social  History:  reports that she has never smoked. Her smokeless tobacco use includes snuff. She reports that she does not drink alcohol or use drugs.  REVIEW Of SYSTEMS:   Hypertension: She has had difficulty with blood pressure control but now on 2 drugs including losartan  BP Readings from Last 3 Encounters:  06/24/19 140/86  04/13/19 126/84  03/02/19 140/90   She has prediabetes She thinks she is trying to watch her diet and lose some weight  Lab Results  Component Value Date   HGBA1C 6.2 (H) 06/24/2019   HGBA1C 6.0 (H) 11/26/2016   Lab Results  Component Value Date   MICROALBUR 1.56 08/15/2013   LDLCALC 114 (H) 06/24/2019   CREATININE 0.89 06/24/2019     Examination:   There were no vitals taken for this visit.       Assessment   Hypothyroidism, post ablative for several years  Her levothyroxine requirement has fluctuated between 125 and 150 mcg over the last couple of years  However now her TSH is again slightly low with 125 mcg and has been taking the same brand of Euthyrox No change in compliance Subjectively doing well    Treatment:  She will go down to 6-1/2 tablets/week of the 125 mcg levothyroxine and will continue Euthyrox brand from her [place] She will follow-up in 4 months She will follow-up with PCP for management of her hypertension and prediabetes   [name] 09/10/2019, 3:08 PM

## 2019-09-14 ENCOUNTER — Other Ambulatory Visit: Payer: Self-pay | Admitting: Emergency Medicine

## 2019-09-14 DIAGNOSIS — Z1231 Encounter for screening mammogram for malignant neoplasm of breast: Secondary | ICD-10-CM

## 2019-10-13 ENCOUNTER — Ambulatory Visit (AMBULATORY_SURGERY_CENTER): Payer: Self-pay | Admitting: *Deleted

## 2019-10-13 ENCOUNTER — Other Ambulatory Visit: Payer: Self-pay

## 2019-10-13 VITALS — Temp 97.5°F | Ht 67.0 in | Wt 232.0 lb

## 2019-10-13 DIAGNOSIS — Z1211 Encounter for screening for malignant neoplasm of colon: Secondary | ICD-10-CM

## 2019-10-13 MED ORDER — SUPREP BOWEL PREP KIT 17.5-3.13-1.6 GM/177ML PO SOLN: 1.0000 | Freq: Once | ORAL | 0 refills | Status: AC

## 2019-10-13 NOTE — Progress Notes (Addendum)
No egg or soy allergy known to patient  No issues with past sedation with any surgeries  or procedures, no intubation problems  No diet pills per patient No home 02 use per patient  No blood thinners per patient  Pt states  issues with constipation - hard balls - uses metamucil as needed - will do a 2 day Suprep for Pt  No A fib or A flutter  EMMI video sent to pt's e mail   Pt instructed to NOT use her Snuff the day of her colon   Completed covid vaccines 09-25-2019  Due to the COVID-19 pandemic we are asking patients to follow these guidelines. Please only bring one care partner. Please be aware that your care partner may wait in the car in the parking lot or if they feel like they will be too hot to wait in the car, they may wait in the lobby on the 4th floor. All care partners are required to wear a mask the entire time (we do not have any that we can provide them), they need to practice social distancing, and we will do a Covid check for all patient's and care partners when you arrive. Also we will check their temperature and your temperature. If the care partner waits in their car they need to stay in the parking lot the entire time and we will call them on their cell phone when the patient is ready for discharge so they can bring the car to the front of the building. Also all patient's will need to wear a mask into building.  Suprep coupon and code

## 2019-10-23 ENCOUNTER — Encounter: Payer: Self-pay | Admitting: Gastroenterology

## 2019-10-25 ENCOUNTER — Other Ambulatory Visit: Payer: Self-pay | Admitting: Emergency Medicine

## 2019-10-25 DIAGNOSIS — I10 Essential (primary) hypertension: Secondary | ICD-10-CM

## 2019-10-25 NOTE — Telephone Encounter (Signed)
Requested medication (s) are due for refill today: yes  Requested medication (s) are on the active medication list: es  Last refill:  10/30/18  Future visit scheduled: yes  Notes to clinic:  expired 10/07/19   Requested Prescriptions  Pending Prescriptions Disp Refills   atenolol (TENORMIN) 50 MG tablet [Pharmacy Med Name: Atenolol 50 MG Oral Tablet] 90 tablet 0    Sig: Take 1 tablet by mouth once daily      Cardiovascular:  Beta Blockers Failed - 10/25/2019  8:21 AM      Failed - Last BP in normal range    BP Readings from Last 1 Encounters:  06/24/19 140/86          Passed - Last Heart Rate in normal range    Pulse Readings from Last 1 Encounters:  06/24/19 78          Passed - Valid encounter within last 6 months    Recent Outpatient Visits           4 months ago Routine general medical examination at a health care facility   [facility], Eilleen Kempf, MD   5 months ago Cough   [facility] at [facility], Eilleen Kempf, MD   12 months ago Essential hypertension   [facility] [place], Eilleen Kempf, MD   1 year ago Post-viral cough syndrome   [facility] at [facility], Sandria Bales, MD   2 years ago Cough   [facility] at [facility], Gerald Stabs, PA-C       Future Appointments             In 1 month [place], Eilleen Kempf, MD [facility] [place], [facility]

## 2019-10-27 ENCOUNTER — Encounter: Payer: Self-pay | Admitting: Gastroenterology

## 2019-10-27 ENCOUNTER — Other Ambulatory Visit: Payer: Self-pay | Admitting: Emergency Medicine

## 2019-10-27 ENCOUNTER — Ambulatory Visit (AMBULATORY_SURGERY_CENTER): Payer: BC Managed Care – PPO | Admitting: Gastroenterology

## 2019-10-27 ENCOUNTER — Other Ambulatory Visit: Payer: Self-pay

## 2019-10-27 VITALS — BP 141/87 | HR 60 | Temp 97.5°F | Resp 12 | Ht 67.0 in | Wt 232.0 lb

## 2019-10-27 DIAGNOSIS — Z1211 Encounter for screening for malignant neoplasm of colon: Secondary | ICD-10-CM

## 2019-10-27 DIAGNOSIS — I10 Essential (primary) hypertension: Secondary | ICD-10-CM

## 2019-10-27 MED ORDER — SODIUM CHLORIDE 0.9 % IV SOLN: 500.0000 mL | Freq: Once | INTRAVENOUS | Status: DC

## 2019-10-27 NOTE — Patient Instructions (Signed)
Please, read your discharge instructions. You have our number if you need [place].   YOU HAD AN ENDOSCOPIC PROCEDURE TODAY AT THE [facility]:   Refer to the procedure report that was given to you for any specific questions about what was found during the examination.  If the procedure report does not answer your questions, please call your gastroenterologist to clarify.  If you requested that your care partner not be given the details of your procedure findings, then the procedure report has been included in a sealed envelope for you to review at your convenience later.  YOU SHOULD EXPECT: Some feelings of bloating in the abdomen. Passage of more gas than usual.  Walking can help get rid of the air that was put into your GI tract during the procedure and reduce the bloating. If you had a lower endoscopy (such as a colonoscopy or flexible sigmoidoscopy) you may notice spotting of blood in your stool or on the toilet paper. If you underwent a bowel prep for your procedure, you may not have a normal bowel movement for a few days.  Please Note:  You might notice some irritation and congestion in your nose or some drainage.  This is from the oxygen used during your procedure.  There is no need for concern and it should clear up in a day or so.  SYMPTOMS TO REPORT IMMEDIATELY:   Following lower endoscopy (colonoscopy or flexible sigmoidoscopy):  Excessive amounts of blood in the stool  Significant tenderness or worsening of abdominal pains  Swelling of the abdomen that is new, acute  Fever of 100F or higher   For urgent or emergent issues, a gastroenterologist can be reached at any hour by calling [phone]. Do not use MyChart messaging for urgent concerns.    DIET:  We do recommend a small meal at first, but then you may proceed to your regular diet.  Drink plenty of fluids but you should avoid alcoholic beverages for 24 hours.  ACTIVITY:  You should plan to take it easy for the  rest of today and you should NOT DRIVE or use heavy machinery until tomorrow (because of the sedation medicines used during the test).    FOLLOW UP: Our staff will call the number listed on your records 48-72 hours following your procedure to check on you and address any questions or concerns that you may have regarding the information given to you following your procedure. If we do not reach you, we will leave a message.  We will attempt to reach you two times.  During this call, we will ask if you have developed any symptoms of COVID 19. If you develop any symptoms (ie: fever, flu-like symptoms, shortness of breath, cough etc.) before then, please call [phone].  If you test positive for Covid 19 in the 2 weeks post procedure, please call and report this information to [place].     SIGNATURES/CONFIDENTIALITY: You and/or your care partner have signed paperwork which will be entered into your electronic medical record.  These signatures attest to the fact that that the information above on your After Visit Summary has been reviewed and is understood.  Full responsibility of the confidentiality of this discharge information lies with you and/or your care-partner.

## 2019-10-27 NOTE — Op Note (Signed)
[facility] Patient Name: Danielle Khan Procedure Date: 10/27/2019 9:02 AM MRN: [id number] Endoscopist: Tressia Danas MD, MD Age: 49 Referring MD:  Date of Birth: 05-25-1971 Gender: Female Account #: [id number] Procedure:                Colonoscopy Indications:              Screening for colorectal malignant neoplasm, This                            is the patient's first colonoscopy Medicines:                Monitored Anesthesia Care Procedure:                Pre-Anesthesia Assessment:                           - Prior to the procedure, a History and Physical                            was performed, and patient medications and                            allergies were reviewed. The patient's tolerance of                            previous anesthesia was also reviewed. The risks                            and benefits of the procedure and the sedation                            options and risks were discussed with the patient.                            All questions were answered, and informed consent                            was obtained. Prior Anticoagulants: The patient has                            taken no previous anticoagulant or antiplatelet                            agents. ASA Grade Assessment: II - A patient with                            mild systemic disease. After reviewing the risks                            and benefits, the patient was deemed in                            satisfactory condition to undergo the procedure.  After obtaining informed consent, the colonoscope                            was passed under direct vision. Throughout the                            procedure, the patient's blood pressure, pulse, and                            oxygen saturations were monitored continuously. The                            Colonoscope was introduced through the anus and                            advanced to the the  cecum, identified by                            appendiceal orifice and ileocecal valve. The                            colonoscopy was performed with difficulty due to a                            redundant colon, significant looping and a tortuous                            colon. Successful completion of the procedure was                            aided by changing the patient's position. The                            patient tolerated the procedure well. The quality                            of the bowel preparation was good. The ileocecal                            valve, appendiceal orifice, and rectum were                            photographed. Scope In: 9:10:37 AM Scope Out: 9:23:44 AM Scope Withdrawal Time: 0 hours 8 minutes 28 seconds  Total Procedure Duration: 0 hours 13 minutes 7 seconds  Findings:                 The perianal and digital rectal examinations were                            normal.                           Non-bleeding internal hemorrhoids were found. The  hemorrhoids were small.                           The exam was otherwise without abnormality on                            direct and retroflexion views. Complications:            No immediate complications. Estimated blood loss:                            Minimal. Estimated Blood Loss:     Estimated blood loss: none. Impression:               - Non-bleeding internal hemorrhoids.                           - The examination was otherwise normal on direct                            and retroflexion views.                           - No specimens collected. Recommendation:           - Patient has a contact number available for                            emergencies. The signs and symptoms of potential                            delayed complications were discussed with the                            patient. Return to normal activities tomorrow.                             Written discharge instructions were provided to the                            patient.                           - Resume previous diet.                           - Continue present medications.                           - Repeat colonoscopy in 10 years for screening                            purposes.                           - Emerging evidence supports eating a diet of  fruits, vegetables, grains, calcium, and yogurt                            while reducing red meat and alcohol may reduce the                            risk of colon cancer.                           - Thank you for allowing me to be involved in your                            colon cancer prevention. Thornton Park MD, MD 10/27/2019 9:33:27 AM This report has been signed electronically.

## 2019-10-27 NOTE — Progress Notes (Signed)
CW- vitals LS- temp 

## 2019-10-27 NOTE — Progress Notes (Signed)
Pt's states no medical or surgical changes since previsit or office visit. 

## 2019-10-27 NOTE — Progress Notes (Signed)
A/ox3, pleased with MAC, report to RN 

## 2019-10-28 ENCOUNTER — Other Ambulatory Visit: Payer: Self-pay | Admitting: Emergency Medicine

## 2019-10-28 DIAGNOSIS — I10 Essential (primary) hypertension: Secondary | ICD-10-CM

## 2019-10-29 ENCOUNTER — Telehealth: Payer: Self-pay | Admitting: *Deleted

## 2019-10-29 NOTE — Telephone Encounter (Signed)
  Follow up Call-  Call back number 10/27/2019  Post procedure Call Back phone  # [phone]  Permission to leave phone message Yes  Some recent data might be hidden     Patient questions:  Do you have a fever, pain , or abdominal swelling? No. Pain Score  0 *  Have you tolerated food without any problems? Yes.    Have you been able to return to your normal activities? Yes.    Do you have any questions about your discharge instructions: Diet   No. Medications  No. Follow up visit  No.  Do you have questions or concerns about your Care? No.  Actions: * If pain score is 4 or above: 1. No action needed, pain <4.Have you developed a fever since your procedure? no  2.   Have you had an respiratory symptoms (SOB or cough) since your procedure? no  3.   Have you tested positive for COVID 19 since your procedure no  4.   Have you had any family members/close contacts diagnosed with the COVID 19 since your procedure?  no   If yes to any of these questions please route to Laverna Peace, RN and Charlett Lango, RN

## 2019-11-09 ENCOUNTER — Ambulatory Visit: Payer: BC Managed Care – PPO

## 2019-12-22 ENCOUNTER — Ambulatory Visit: Payer: BC Managed Care – PPO | Admitting: Emergency Medicine

## 2019-12-24 ENCOUNTER — Encounter: Payer: Self-pay | Admitting: Emergency Medicine

## 2019-12-24 ENCOUNTER — Other Ambulatory Visit: Payer: Self-pay

## 2019-12-24 ENCOUNTER — Ambulatory Visit: Payer: BC Managed Care – PPO | Admitting: Emergency Medicine

## 2019-12-24 VITALS — BP 158/83 | HR 73 | Temp 97.9°F | Resp 16 | Ht 66.75 in | Wt 243.0 lb

## 2019-12-24 DIAGNOSIS — R7303 Prediabetes: Secondary | ICD-10-CM | POA: Insufficient documentation

## 2019-12-24 DIAGNOSIS — I10 Essential (primary) hypertension: Secondary | ICD-10-CM | POA: Diagnosis not present

## 2019-12-24 DIAGNOSIS — D649 Anemia, unspecified: Secondary | ICD-10-CM

## 2019-12-24 DIAGNOSIS — E890 Postprocedural hypothyroidism: Secondary | ICD-10-CM

## 2019-12-24 NOTE — Patient Instructions (Addendum)
If you have lab work done today you will be contacted with your lab results within the next 2 weeks.  If you have not heard from [place] then please contact us. The fastest way to get your results is to register for My Chart.   IF you received an x-ray today, you will receive an invoice from [facility]. Please contact [facility] at [phone] with questions or concerns regarding your invoice.   IF you received labwork today, you will receive an invoice from [facility]. Please contact [facility] at [phone] with questions or concerns regarding your invoice.   Our billing staff will not be able to assist you with questions regarding bills from these companies.  You will be contacted with the lab results as soon as they are available. The fastest way to get your results is to activate your My Chart account. Instructions are located on the last page of this paperwork. If you have not heard from [place] regarding the results in 2 weeks, please contact this office.     How to Take Your Blood Pressure You can take your blood pressure at home with a machine. You may need to check your blood pressure at home:  To check if you have high blood pressure (hypertension).  To check your blood pressure over time.  To make sure your blood pressure medicine is working. Supplies needed: You will need a blood pressure machine, or monitor. You can buy one at a drugstore or online. When choosing one:  Choose one with an arm cuff.  Choose one that wraps around your upper arm. Only one finger should fit between your arm and the cuff.  Do not choose one that measures your blood pressure from your wrist or finger. Your doctor can suggest a monitor. How to prepare Avoid these things for 30 minutes before checking your blood pressure:  Drinking caffeine.  Drinking alcohol.  Eating.  Smoking.  Exercising. Five minutes before checking your blood pressure:  Pee.  Sit in a dining  chair. Avoid sitting in a soft couch or armchair.  Be quiet. Do not talk. How to take your blood pressure Follow the instructions that came with your machine. If you have a digital blood pressure monitor, these may be the instructions: 1. Sit up straight. 2. Place your feet on the floor. Do not cross your ankles or legs. 3. Rest your left arm at the level of your heart. You may rest it on a table, desk, or chair. 4. Pull up your shirt sleeve. 5. Wrap the blood pressure cuff around the upper part of your left arm. The cuff should be 1 inch (2.5 cm) above your elbow. It is best to wrap the cuff around bare skin. 6. Fit the cuff snugly around your arm. You should be able to place only one finger between the cuff and your arm. 7. Put the cord inside the groove of your elbow. 8. Press the power button. 9. Sit quietly while the cuff fills with air and loses air. 10. Write down the numbers on the screen. 11. Wait 2-3 minutes and then repeat steps 1-10. What do the numbers mean? Two numbers make up your blood pressure. The first number is called systolic pressure. The second is called diastolic pressure. An example of a blood pressure reading is "120 over 80" (or 120/80). If you are an adult and do not have a medical condition, use this guide to find out if your blood pressure is normal:  Normal  First number: below 120.  Second number: below 80. Elevated  First number: 120-129.  Second number: below 80. Hypertension stage 1  First number: 130-139.  Second number: 80-89. Hypertension stage 2  First number: 140 or above.  Second number: 90 or above. Your blood pressure is above normal even if only the top or bottom number is above normal. Follow these instructions at home:  Check your blood pressure as often as your doctor tells you to.  Take your monitor to your next doctor's appointment. Your doctor will: ? Make sure you are using it correctly. ? Make sure it is working  right.  Make sure you understand what your blood pressure numbers should be.  Tell your doctor if your medicines are causing side effects. Contact a doctor if:  Your blood pressure keeps being high. Get help right away if:  Your first blood pressure number is higher than 180.  Your second blood pressure number is higher than 120. This information is not intended to replace advice given to you by your health care provider. Make sure you discuss any questions you have with your health care provider. Document Revised: 05/17/2017 Document Reviewed: 11/11/2015 Elsevier Patient Education  2020 [employer].  Hypertension, Adult High blood pressure (hypertension) is when the force of blood pumping through the arteries is too strong. The arteries are the blood vessels that carry blood from the heart throughout the body. Hypertension forces the heart to work harder to pump blood and may cause arteries to become narrow or stiff. Untreated or uncontrolled hypertension can cause a heart attack, heart failure, a stroke, kidney disease, and other problems. A blood pressure reading consists of a higher number over a lower number. Ideally, your blood pressure should be below 120/80. The first ("top") number is called the systolic pressure. It is a measure of the pressure in your arteries as your heart beats. The second ("bottom") number is called the diastolic pressure. It is a measure of the pressure in your arteries as the heart relaxes. What are the causes? The exact cause of this condition is not known. There are some conditions that result in or are related to high blood pressure. What increases the risk? Some risk factors for high blood pressure are under your control. The following factors may make you more likely to develop this condition:  Smoking.  Having type 2 diabetes mellitus, high cholesterol, or both.  Not getting enough exercise or physical activity.  Being overweight.  Having too  much fat, sugar, calories, or salt (sodium) in your diet.  Drinking too much alcohol. Some risk factors for high blood pressure may be difficult or impossible to change. Some of these factors include:  Having chronic kidney disease.  Having a family history of high blood pressure.  Age. Risk increases with age.  Race. You may be at higher risk if you are African American.  Gender. Men are at higher risk than women before age 51. After age 90, women are at higher risk than men.  Having obstructive sleep apnea.  Stress. What are the signs or symptoms? High blood pressure may not cause symptoms. Very high blood pressure (hypertensive crisis) may cause:  Headache.  Anxiety.  Shortness of breath.  Nosebleed.  Nausea and vomiting.  Vision changes.  Severe chest pain.  Seizures. How is this diagnosed? This condition is diagnosed by measuring your blood pressure while you are seated, with your arm resting on a flat surface, your legs uncrossed, and  your feet flat on the floor. The cuff of the blood pressure monitor will be placed directly against the skin of your upper arm at the level of your heart. It should be measured at least twice using the same arm. Certain conditions can cause a difference in blood pressure between your right and left arms. Certain factors can cause blood pressure readings to be lower or higher than normal for a short period of time:  When your blood pressure is higher when you are in a health care provider's office than when you are at home, this is called white coat hypertension. Most people with this condition do not need medicines.  When your blood pressure is higher at home than when you are in a health care provider's office, this is called masked hypertension. Most people with this condition may need medicines to control blood pressure. If you have a high blood pressure reading during one visit or you have normal blood pressure with other risk  factors, you may be asked to:  Return on a different day to have your blood pressure checked again.  Monitor your blood pressure at home for 1 week or longer. If you are diagnosed with hypertension, you may have other blood or imaging tests to help your health care provider understand your overall risk for other conditions. How is this treated? This condition is treated by making healthy lifestyle changes, such as eating healthy foods, exercising more, and reducing your alcohol intake. Your health care provider may prescribe medicine if lifestyle changes are not enough to get your blood pressure under control, and if:  Your systolic blood pressure is above 130.  Your diastolic blood pressure is above 80. Your personal target blood pressure may vary depending on your medical conditions, your age, and other factors. Follow these instructions at home: Eating and drinking   Eat a diet that is high in fiber and potassium, and low in sodium, added sugar, and fat. An example eating plan is called the DASH (Dietary Approaches to Stop Hypertension) diet. To eat this way: ? Eat plenty of fresh fruits and vegetables. Try to fill one half of your plate at each meal with fruits and vegetables. ? Eat whole grains, such as whole-wheat pasta, brown rice, or whole-grain bread. Fill about one fourth of your plate with whole grains. ? Eat or drink low-fat dairy products, such as skim milk or low-fat yogurt. ? Avoid fatty cuts of meat, processed or cured meats, and poultry with skin. Fill about one fourth of your plate with lean proteins, such as fish, chicken without skin, beans, eggs, or tofu. ? Avoid pre-made and processed foods. These tend to be higher in sodium, added sugar, and fat.  Reduce your daily sodium intake. Most people with hypertension should eat less than 1,500 mg of sodium a day.  Do not drink alcohol if: ? Your health care provider tells you not to drink. ? You are pregnant, may be  pregnant, or are planning to become pregnant.  If you drink alcohol: ? Limit how much you use to:  0-1 drink a day for women.  0-2 drinks a day for men. ? Be aware of how much alcohol is in your drink. In the [place] one drink equals one 12 oz bottle of beer (355 mL), one 5 oz glass of wine (148 mL), or one 1 oz glass of hard liquor (44 mL). Lifestyle   Work with your health care provider to maintain a healthy body weight or  to lose weight. Ask what an ideal weight is for you.  Get at least 30 minutes of exercise most days of the week. Activities may include walking, swimming, or biking.  Include exercise to strengthen your muscles (resistance exercise), such as Pilates or lifting weights, as part of your weekly exercise routine. Try to do these types of exercises for 30 minutes at least 3 days a week.  Do not use any products that contain nicotine or tobacco, such as cigarettes, e-cigarettes, and chewing tobacco. If you need help quitting, ask your health care provider.  Monitor your blood pressure at home as told by your health care provider.  Keep all follow-up visits as told by your health care provider. This is important. Medicines  Take over-the-counter and prescription medicines only as told by your health care provider. Follow directions carefully. Blood pressure medicines must be taken as prescribed.  Do not skip doses of blood pressure medicine. Doing this puts you at risk for problems and can make the medicine less effective.  Ask your health care provider about side effects or reactions to medicines that you should watch for. Contact a health care provider if you:  Think you are having a reaction to a medicine you are taking.  Have headaches that keep coming back (recurring).  Feel dizzy.  Have swelling in your ankles.  Have trouble with your vision. Get help right away if you:  Develop a severe headache or confusion.  Have unusual weakness or numbness.  Feel  faint.  Have severe pain in your chest or abdomen.  Vomit repeatedly.  Have trouble breathing. Summary  Hypertension is when the force of blood pumping through your arteries is too strong. If this condition is not controlled, it may put you at risk for serious complications.  Your personal target blood pressure may vary depending on your medical conditions, your age, and other factors. For most people, a normal blood pressure is less than 120/80.  Hypertension is treated with lifestyle changes, medicines, or a combination of both. Lifestyle changes include losing weight, eating a healthy, low-sodium diet, exercising more, and limiting alcohol. This information is not intended to replace advice given to you by your health care provider. Make sure you discuss any questions you have with your health care provider. Document Revised: 02/12/2018 Document Reviewed: 02/12/2018 Elsevier Patient Education  2020 [employer].

## 2019-12-24 NOTE — Assessment & Plan Note (Signed)
Systolic blood pressure elevated in the office but normal at home and at work. Continue present medications.  No changes. Recommended to monitor your blood pressure readings at home. Blood work done today while fasting. Follow-up in 3 months for blood pressure recheck.

## 2019-12-24 NOTE — Progress Notes (Signed)
Danielle Khan Danielle Khan 49 y.o.   Chief Complaint  Patient presents with  . Hypertension    FOLLOW UP 6 MONTH - per pt blood pressure checked at work 12/17/2019 - 124/80 left arm    HISTORY OF PRESENT ILLNESS: This is a 49 y.o. female with history of hypertension here for follow-up. Presently on atenolol 50 mg daily and losartan 50 mg daily.  Blood pressure at work recently within normal limits. Does not check blood pressure readings. BP Readings from Last 3 Encounters:  12/24/19 (!) 158/83  10/27/19 (!) 141/87  06/24/19 140/86  Has no complaints or medical concerns today. Review of most recent blood work results that show prediabetes, anemia, and mild elevation of LDL cholesterol. Fully vaccinated against Covid.  HPI   Prior to Admission medications   Medication Sig Start Date End Date Taking? Authorizing Provider  atenolol (TENORMIN) 50 MG tablet Take 1 tablet by mouth once daily 10/26/19  Yes Lorina Duffner, [place], MD  EUTHYROX 125 MCG tablet 1 tablet before breakfast daily except half tablet on Sundays 09/10/19  Yes Reather Littler, MD  losartan (COZAAR) 50 MG tablet Take 1 tablet (50 mg total) by mouth daily. 06/24/19  Yes Caryn Gienger, Eilleen Kempf, MD  psyllium (METAMUCIL) 58.6 % powder Take 1 packet by mouth as needed.   Yes [provider]  triamcinolone (NASACORT AQ) 55 MCG/ACT AERO nasal inhaler Place 2 sprays into the nose daily as needed. 06/09/18  Yes Peyton Najjar, MD    No Known Allergies  Patient Active Problem List   Diagnosis Date Noted  . 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 Hypertension 08/19/2013    Past Medical History:  Diagnosis Date  . Chronic constipation    uses metamucil  . GERD (gastroesophageal reflux disease)   . Hypertension   . Thyroid disease     Past Surgical History:  Procedure Laterality Date  . ABDOMINAL HYSTERECTOMY      Social History   Socioeconomic  History  . Marital status: Married    Spouse name: Not on file  . Number of children: Not on file  . Years of education: Not on file  . Highest education level: Not on file  Occupational History  . Not on file  Tobacco Use  . Smoking status: Never Smoker  . Smokeless tobacco: Current User    Types: Snuff  . Tobacco comment: last used 10/26/19  Substance and Sexual Activity  . Alcohol use: No    Alcohol/week: 0.0 standard drinks  . Drug use: No  . Sexual activity: Yes  Other Topics Concern  . Not on file  Social History Narrative  . Not on file   Social Determinants of Health   Financial Resource Strain:   . Difficulty of Paying Living Expenses:   Food Insecurity:   . Worried About Programme researcher, broadcasting/film/video in the Last Year:   . Barista in the Last Year:   Transportation Needs:   . Freight forwarder (Medical):   Marland Kitchen Lack of Transportation (Non-Medical):   Physical Activity:   . Days of Exercise per Week:   . Minutes of Exercise per Session:   Stress:   . Feeling of Stress :   Social Connections:   . Frequency of Communication with Friends and Family:   . Frequency of Social Gatherings with Friends and Family:   . Attends Religious Services:   . Active Member of Clubs or Organizations:   .  Attends Banker Meetings:   Marland Kitchen Marital Status:   Intimate Partner Violence:   . Fear of Current or Ex-Partner:   . Emotionally Abused:   Marland Kitchen Physically Abused:   . Sexually Abused:     Family History  Problem Relation Age of Onset  . Breast cancer Maternal Aunt   . Ovarian cancer Maternal Aunt   . Colon cancer Neg Hx   . Colon polyps Neg Hx   . Esophageal cancer Neg Hx   . Rectal cancer Neg Hx   . Stomach cancer Neg Hx      Review of Systems  Constitutional: Negative.  Negative for chills and fever.  HENT: Negative.  Negative for congestion and sore throat.   Respiratory: Negative.  Negative for cough and shortness of breath.   Cardiovascular:  Negative.  Negative for chest pain and palpitations.  Gastrointestinal: Negative.  Negative for abdominal pain, diarrhea, nausea and vomiting.  Genitourinary: Negative.  Negative for dysuria and hematuria.  Musculoskeletal: Negative.   Skin: Negative.   Neurological: Negative.  Negative for dizziness and headaches.  All other systems reviewed and are negative.  Today's Vitals   12/24/19 1009  BP: (!) 158/83  Pulse: 73  Resp: 16  Temp: 97.9 F (36.6 C)  TempSrc: Temporal  SpO2: 98%  Weight: 243 lb (110.2 kg)  Height: 5' 6.75" (1.695 m)   Body mass index is 38.34 kg/m.   Physical Exam Vitals reviewed.  Constitutional:      Appearance: Normal appearance. She is obese.  HENT:     Head: Normocephalic.  Eyes:     Extraocular Movements: Extraocular movements intact.     Conjunctiva/sclera: Conjunctivae normal.     Pupils: Pupils are equal, round, and reactive to light.  Cardiovascular:     Rate and Rhythm: Normal rate and regular rhythm.     Pulses: Normal pulses.     Heart sounds: Normal heart sounds.  Pulmonary:     Effort: Pulmonary effort is normal.     Breath sounds: Normal breath sounds.  Musculoskeletal:        General: Normal range of motion.     Cervical back: Normal range of motion and neck supple.     Right lower leg: No edema.     Left lower leg: No edema.  Skin:    General: Skin is warm and dry.     Capillary Refill: Capillary refill takes less than 2 seconds.  Neurological:     General: No focal deficit present.     Mental Status: She is alert and oriented to person, place, and time.  Psychiatric:        Mood and Affect: Mood normal.        Behavior: Behavior normal.    A total of 30 minutes was spent with the patient, greater than 50% of which was in counseling/coordination of care regarding hypertension and cardiovascular risks associated with this condition, review of all medications, review of most recent office visit notes, review of most recent  blood work results, diet and nutrition, need to start monitoring blood pressure readings at home, prognosis and follow-up in 3 months.   ASSESSMENT & PLAN: Hypertension Systolic blood pressure elevated in the office but normal at home and at work. Continue present medications.  No changes. Recommended to monitor your blood pressure readings at home. Blood work done today while fasting. Follow-up in 3 months for blood pressure recheck.  Jolynda was seen today for hypertension.  Diagnoses and  all orders for this visit:  Essential hypertension -     Comprehensive metabolic panel -     Lipid panel  Prediabetes -     Hemoglobin A1c  Anemia, unspecified type -     CBC with Differential/Platelet  Hypothyroidism, postradioiodine therapy    Patient Instructions       If you have lab work done today you will be contacted with your lab results within the next 2 weeks.  If you have not heard from [place] then please contact us. The fastest way to get your results is to register for My Chart.   IF you received an x-ray today, you will receive an invoice from [facility]. Please contact [facility] at [phone] with questions or concerns regarding your invoice.   IF you received labwork today, you will receive an invoice from [facility]. Please contact [facility] at [phone] with questions or concerns regarding your invoice.   Our billing staff will not be able to assist you with questions regarding bills from these companies.  You will be contacted with the lab results as soon as they are available. The fastest way to get your results is to activate your My Chart account. Instructions are located on the last page of this paperwork. If you have not heard from [place] regarding the results in 2 weeks, please contact this office.     How to Take Your Blood Pressure You can take your blood pressure at home with a machine. You may need to check your blood pressure at  home:  To check if you have high blood pressure (hypertension).  To check your blood pressure over time.  To make sure your blood pressure medicine is working. Supplies needed: You will need a blood pressure machine, or monitor. You can buy one at a drugstore or online. When choosing one:  Choose one with an arm cuff.  Choose one that wraps around your upper arm. Only one finger should fit between your arm and the cuff.  Do not choose one that measures your blood pressure from your wrist or finger. Your doctor can suggest a monitor. How to prepare Avoid these things for 30 minutes before checking your blood pressure:  Drinking caffeine.  Drinking alcohol.  Eating.  Smoking.  Exercising. Five minutes before checking your blood pressure:  Pee.  Sit in a dining chair. Avoid sitting in a soft couch or armchair.  Be quiet. Do not talk. How to take your blood pressure Follow the instructions that came with your machine. If you have a digital blood pressure monitor, these may be the instructions: 1. Sit up straight. 2. Place your feet on the floor. Do not cross your ankles or legs. 3. Rest your left arm at the level of your heart. You may rest it on a table, desk, or chair. 4. Pull up your shirt sleeve. 5. Wrap the blood pressure cuff around the upper part of your left arm. The cuff should be 1 inch (2.5 cm) above your elbow. It is best to wrap the cuff around bare skin. 6. Fit the cuff snugly around your arm. You should be able to place only one finger between the cuff and your arm. 7. Put the cord inside the groove of your elbow. 8. Press the power button. 9. Sit quietly while the cuff fills with air and loses air. 10. Write down the numbers on the screen. 11. Wait 2-3 minutes and then repeat steps 1-10. What do the numbers mean? Two  numbers make up your blood pressure. The first number is called systolic pressure. The second is called diastolic pressure. An example of a  blood pressure reading is "120 over 80" (or 120/80). If you are an adult and do not have a medical condition, use this guide to find out if your blood pressure is normal: Normal  First number: below 120.  Second number: below 80. Elevated  First number: 120-129.  Second number: below 80. Hypertension stage 1  First number: 130-139.  Second number: 80-89. Hypertension stage 2  First number: 140 or above.  Second number: 90 or above. Your blood pressure is above normal even if only the top or bottom number is above normal. Follow these instructions at home:  Check your blood pressure as often as your doctor tells you to.  Take your monitor to your next doctor's appointment. Your doctor will: ? Make sure you are using it correctly. ? Make sure it is working right.  Make sure you understand what your blood pressure numbers should be.  Tell your doctor if your medicines are causing side effects. Contact a doctor if:  Your blood pressure keeps being high. Get help right away if:  Your first blood pressure number is higher than 180.  Your second blood pressure number is higher than 120. This information is not intended to replace advice given to you by your health care provider. Make sure you discuss any questions you have with your health care provider. Document Revised: 05/17/2017 Document Reviewed: 11/11/2015 Elsevier Patient Education  2020 [employer].  Hypertension, Adult High blood pressure (hypertension) is when the force of blood pumping through the arteries is too strong. The arteries are the blood vessels that carry blood from the heart throughout the body. Hypertension forces the heart to work harder to pump blood and may cause arteries to become narrow or stiff. Untreated or uncontrolled hypertension can cause a heart attack, heart failure, a stroke, kidney disease, and other problems. A blood pressure reading consists of a higher number over a lower number.  Ideally, your blood pressure should be below 120/80. The first ("top") number is called the systolic pressure. It is a measure of the pressure in your arteries as your heart beats. The second ("bottom") number is called the diastolic pressure. It is a measure of the pressure in your arteries as the heart relaxes. What are the causes? The exact cause of this condition is not known. There are some conditions that result in or are related to high blood pressure. What increases the risk? Some risk factors for high blood pressure are under your control. The following factors may make you more likely to develop this condition:  Smoking.  Having type 2 diabetes mellitus, high cholesterol, or both.  Not getting enough exercise or physical activity.  Being overweight.  Having too much fat, sugar, calories, or salt (sodium) in your diet.  Drinking too much alcohol. Some risk factors for high blood pressure may be difficult or impossible to change. Some of these factors include:  Having chronic kidney disease.  Having a family history of high blood pressure.  Age. Risk increases with age.  Race. You may be at higher risk if you are African American.  Gender. Men are at higher risk than women before age 11. After age 7, women are at higher risk than men.  Having obstructive sleep apnea.  Stress. What are the signs or symptoms? High blood pressure may not cause symptoms. Very high blood pressure (  hypertensive crisis) may cause:  Headache.  Anxiety.  Shortness of breath.  Nosebleed.  Nausea and vomiting.  Vision changes.  Severe chest pain.  Seizures. How is this diagnosed? This condition is diagnosed by measuring your blood pressure while you are seated, with your arm resting on a flat surface, your legs uncrossed, and your feet flat on the floor. The cuff of the blood pressure monitor will be placed directly against the skin of your upper arm at the level of your heart. It  should be measured at least twice using the same arm. Certain conditions can cause a difference in blood pressure between your right and left arms. Certain factors can cause blood pressure readings to be lower or higher than normal for a short period of time:  When your blood pressure is higher when you are in a health care provider's office than when you are at home, this is called white coat hypertension. Most people with this condition do not need medicines.  When your blood pressure is higher at home than when you are in a health care provider's office, this is called masked hypertension. Most people with this condition may need medicines to control blood pressure. If you have a high blood pressure reading during one visit or you have normal blood pressure with other risk factors, you may be asked to:  Return on a different day to have your blood pressure checked again.  Monitor your blood pressure at home for 1 week or longer. If you are diagnosed with hypertension, you may have other blood or imaging tests to help your health care provider understand your overall risk for other conditions. How is this treated? This condition is treated by making healthy lifestyle changes, such as eating healthy foods, exercising more, and reducing your alcohol intake. Your health care provider may prescribe medicine if lifestyle changes are not enough to get your blood pressure under control, and if:  Your systolic blood pressure is above 130.  Your diastolic blood pressure is above 80. Your personal target blood pressure may vary depending on your medical conditions, your age, and other factors. Follow these instructions at home: Eating and drinking   Eat a diet that is high in fiber and potassium, and low in sodium, added sugar, and fat. An example eating plan is called the DASH (Dietary Approaches to Stop Hypertension) diet. To eat this way: ? Eat plenty of fresh fruits and vegetables. Try to fill  one half of your plate at each meal with fruits and vegetables. ? Eat whole grains, such as whole-wheat pasta, brown rice, or whole-grain bread. Fill about one fourth of your plate with whole grains. ? Eat or drink low-fat dairy products, such as skim milk or low-fat yogurt. ? Avoid fatty cuts of meat, processed or cured meats, and poultry with skin. Fill about one fourth of your plate with lean proteins, such as fish, chicken without skin, beans, eggs, or tofu. ? Avoid pre-made and processed foods. These tend to be higher in sodium, added sugar, and fat.  Reduce your daily sodium intake. Most people with hypertension should eat less than 1,500 mg of sodium a day.  Do not drink alcohol if: ? Your health care provider tells you not to drink. ? You are pregnant, may be pregnant, or are planning to become pregnant.  If you drink alcohol: ? Limit how much you use to:  0-1 drink a day for women.  0-2 drinks a day for men. ? Be aware  of how much alcohol is in your drink. In the [place] one drink equals one 12 oz bottle of beer (355 mL), one 5 oz glass of wine (148 mL), or one 1 oz glass of hard liquor (44 mL). Lifestyle   Work with your health care provider to maintain a healthy body weight or to lose weight. Ask what an ideal weight is for you.  Get at least 30 minutes of exercise most days of the week. Activities may include walking, swimming, or biking.  Include exercise to strengthen your muscles (resistance exercise), such as Pilates or lifting weights, as part of your weekly exercise routine. Try to do these types of exercises for 30 minutes at least 3 days a week.  Do not use any products that contain nicotine or tobacco, such as cigarettes, e-cigarettes, and chewing tobacco. If you need help quitting, ask your health care provider.  Monitor your blood pressure at home as told by your health care provider.  Keep all follow-up visits as told by your health care provider. This is  important. Medicines  Take over-the-counter and prescription medicines only as told by your health care provider. Follow directions carefully. Blood pressure medicines must be taken as prescribed.  Do not skip doses of blood pressure medicine. Doing this puts you at risk for problems and can make the medicine less effective.  Ask your health care provider about side effects or reactions to medicines that you should watch for. Contact a health care provider if you:  Think you are having a reaction to a medicine you are taking.  Have headaches that keep coming back (recurring).  Feel dizzy.  Have swelling in your ankles.  Have trouble with your vision. Get help right away if you:  Develop a severe headache or confusion.  Have unusual weakness or numbness.  Feel faint.  Have severe pain in your chest or abdomen.  Vomit repeatedly.  Have trouble breathing. Summary  Hypertension is when the force of blood pumping through your arteries is too strong. If this condition is not controlled, it may put you at risk for serious complications.  Your personal target blood pressure may vary depending on your medical conditions, your age, and other factors. For most people, a normal blood pressure is less than 120/80.  Hypertension is treated with lifestyle changes, medicines, or a combination of both. Lifestyle changes include losing weight, eating a healthy, low-sodium diet, exercising more, and limiting alcohol. This information is not intended to replace advice given to you by your health care provider. Make sure you discuss any questions you have with your health care provider. Document Revised: 02/12/2018 Document Reviewed: 02/12/2018 Elsevier Patient Education  2020 Elsevier Inc.      Edwina Barth, MD [facility]

## 2019-12-25 LAB — CBC WITH DIFFERENTIAL/PLATELET
Basophils Absolute: 0 10*3/uL (ref 0.0–0.2)
Basos: 0 %
EOS (ABSOLUTE): 0.1 10*3/uL (ref 0.0–0.4)
Eos: 2 %
Hematocrit: 30.5 % — ABNORMAL LOW (ref 34.0–46.6)
Hemoglobin: 10.4 g/dL — ABNORMAL LOW (ref 11.1–15.9)
Immature Grans (Abs): 0 10*3/uL (ref 0.0–0.1)
Immature Granulocytes: 0 %
Lymphocytes Absolute: 2 10*3/uL (ref 0.7–3.1)
Lymphs: 32 %
MCH: 37.1 pg — ABNORMAL HIGH (ref 26.6–33.0)
MCHC: 34.1 g/dL (ref 31.5–35.7)
MCV: 109 fL — ABNORMAL HIGH (ref 79–97)
Monocytes Absolute: 0.3 10*3/uL (ref 0.1–0.9)
Monocytes: 6 %
Neutrophils Absolute: 3.8 10*3/uL (ref 1.4–7.0)
Neutrophils: 60 %
Platelets: 348 10*3/uL (ref 150–450)
RBC: 2.8 x10E6/uL — ABNORMAL LOW (ref 3.77–5.28)
RDW: 14.4 % (ref 11.7–15.4)
WBC: 6.2 10*3/uL (ref 3.4–10.8)

## 2019-12-25 LAB — COMPREHENSIVE METABOLIC PANEL
ALT: 18 IU/L (ref 0–32)
AST: 20 IU/L (ref 0–40)
Albumin/Globulin Ratio: 1.1 — ABNORMAL LOW (ref 1.2–2.2)
Albumin: 4.1 g/dL (ref 3.8–4.8)
Alkaline Phosphatase: 226 IU/L — ABNORMAL HIGH (ref 48–121)
BUN/Creatinine Ratio: 13 (ref 9–23)
BUN: 11 mg/dL (ref 6–24)
Bilirubin Total: 0.8 mg/dL (ref 0.0–1.2)
CO2: 23 mmol/L (ref 20–29)
Calcium: 9.1 mg/dL (ref 8.7–10.2)
Chloride: 102 mmol/L (ref 96–106)
Creatinine, Ser: 0.86 mg/dL (ref 0.57–1.00)
GFR calc Af Amer: 91 mL/min/{1.73_m2} (ref >59)
GFR calc non Af Amer: 79 mL/min/{1.73_m2} (ref >59)
Globulin, Total: 3.8 g/dL (ref 1.5–4.5)
Glucose: 91 mg/dL (ref 65–99)
Potassium: 4.3 mmol/L (ref 3.5–5.2)
Sodium: 141 mmol/L (ref 134–144)
Total Protein: 7.9 g/dL (ref 6.0–8.5)

## 2019-12-25 LAB — HEMOGLOBIN A1C
Est. average glucose Bld gHb Est-mCnc: 131 mg/dL
Hgb A1c MFr Bld: 6.2 % — ABNORMAL HIGH (ref 4.8–5.6)

## 2019-12-25 LAB — LIPID PANEL
Chol/HDL Ratio: 3.8 ratio (ref 0.0–4.4)
Cholesterol, Total: 198 mg/dL (ref 100–199)
HDL: 52 mg/dL (ref >39)
LDL Chol Calc (NIH): 118 mg/dL — ABNORMAL HIGH (ref 0–99)
Triglycerides: 160 mg/dL — ABNORMAL HIGH (ref 0–149)
VLDL Cholesterol Cal: 28 mg/dL (ref 5–40)

## 2019-12-26 NOTE — Progress Notes (Signed)
Can you please run an anemia panel on this blood? Thanks.

## 2019-12-28 ENCOUNTER — Telehealth: Payer: Self-pay | Admitting: Emergency Medicine

## 2019-12-28 NOTE — Telephone Encounter (Signed)
Pt received a call from our office. Pt stated it could be about her lab but she saw them on MyChart. Please advise.

## 2019-12-28 NOTE — Addendum Note (Signed)
Addended by: Argentina Ponder on: 12/28/2019 11:58 AM   Modules accepted: Orders

## 2019-12-28 NOTE — Addendum Note (Signed)
Addended by: Argentina Ponder on: 12/28/2019 11:42 AM   Modules accepted: Orders

## 2020-01-01 ENCOUNTER — Ambulatory Visit (INDEPENDENT_AMBULATORY_CARE_PROVIDER_SITE_OTHER): Payer: BC Managed Care – PPO | Admitting: Family Medicine

## 2020-01-01 ENCOUNTER — Other Ambulatory Visit: Payer: Self-pay

## 2020-01-01 DIAGNOSIS — D649 Anemia, unspecified: Secondary | ICD-10-CM

## 2020-01-04 NOTE — Telephone Encounter (Signed)
Patient was informed she needed to start otc vit b12 . Also welcome to come into the office for vit b12 injection. Patient stated she wil get b12 vit otc ad if continue to be low will try the b12 injection. Patient is due for a f/u in Oct and will discuss more at that time

## 2020-01-04 NOTE — Telephone Encounter (Signed)
Pt returning phone call for her lab Results.please Advice

## 2020-01-05 LAB — ANEMIA PANEL
Ferritin: 151 ng/mL — ABNORMAL HIGH (ref 15–150)
Folate, Hemolysate: 204 ng/mL
Folate, RBC: 664 ng/mL (ref >498)
Hematocrit: 30.7 % — ABNORMAL LOW (ref 34.0–46.6)
Iron Saturation: 18 % (ref 15–55)
Iron: 49 ug/dL (ref 27–159)
Retic Ct Pct: 2.2 % (ref 0.6–2.6)
Total Iron Binding Capacity: 271 ug/dL (ref 250–450)
UIBC: 222 ug/dL (ref 131–425)
Vitamin B-12: <50 pg/mL — ABNORMAL LOW (ref 232–1245)

## 2020-01-13 ENCOUNTER — Other Ambulatory Visit: Payer: Self-pay | Admitting: Emergency Medicine

## 2020-01-13 DIAGNOSIS — I10 Essential (primary) hypertension: Secondary | ICD-10-CM

## 2020-01-13 NOTE — Telephone Encounter (Signed)
Requested Prescriptions  Pending Prescriptions Disp Refills   atenolol (TENORMIN) 50 MG tablet [Pharmacy Med Name: Atenolol 50 MG Oral Tablet] 90 tablet 0    Sig: Take 1 tablet by mouth once daily     Cardiovascular:  Beta Blockers Failed - 01/13/2020  5:48 AM      Failed - Last BP in normal range    BP Readings from Last 1 Encounters:  12/24/19 (!) 158/83         Passed - Last Heart Rate in normal range    Pulse Readings from Last 1 Encounters:  12/24/19 73         Passed - Valid encounter within last 6 months    Recent Outpatient Visits          1 week ago Anemia, unspecified type   [facility], Meda Coffee, MD   2 weeks ago Essential hypertension   [facility] [place], Eilleen Kempf, MD   6 months ago Routine general medical examination at a health care facility   [facility] at [facility], Eilleen Kempf, MD   7 months ago Cough   [facility] at [facility], Eilleen Kempf, MD   1 year ago Essential hypertension   [facility] at [facility], Eilleen Kempf, MD      Future Appointments            In 2 months Sagardia, Eilleen Kempf, MD [facility] at [place], [facility]

## 2020-03-17 ENCOUNTER — Other Ambulatory Visit: Payer: Self-pay | Admitting: Endocrinology

## 2020-03-21 ENCOUNTER — Other Ambulatory Visit: Payer: Self-pay | Admitting: Endocrinology

## 2020-03-28 ENCOUNTER — Ambulatory Visit: Payer: BC Managed Care – PPO | Admitting: Emergency Medicine

## 2020-03-30 ENCOUNTER — Other Ambulatory Visit: Payer: Self-pay

## 2020-03-30 ENCOUNTER — Encounter: Payer: Self-pay | Admitting: Emergency Medicine

## 2020-03-30 ENCOUNTER — Ambulatory Visit: Payer: BC Managed Care – PPO | Admitting: Emergency Medicine

## 2020-03-30 VITALS — BP 150/100 | HR 72 | Temp 98.2°F | Resp 16 | Ht 66.75 in | Wt 254.0 lb

## 2020-03-30 DIAGNOSIS — I10 Essential (primary) hypertension: Secondary | ICD-10-CM | POA: Diagnosis not present

## 2020-03-30 DIAGNOSIS — Z6841 Body Mass Index (BMI) 40.0 and over, adult: Secondary | ICD-10-CM

## 2020-03-30 DIAGNOSIS — Z23 Encounter for immunization: Secondary | ICD-10-CM | POA: Diagnosis not present

## 2020-03-30 MED ORDER — LOSARTAN POTASSIUM-HCTZ 50-12.5 MG PO TABS: 1.0000 | ORAL_TABLET | Freq: Every day | ORAL | 3 refills | Status: DC

## 2020-03-30 NOTE — Patient Instructions (Addendum)
   If you have lab work done today you will be contacted with your lab results within the next 2 weeks.  If you have not heard from us then please contact us. The fastest way to get your results is to register for My Chart.   IF you received an x-ray today, you will receive an invoice from [facility]. Please contact [facility] at [phone] with questions or concerns regarding your invoice.   IF you received labwork today, you will receive an invoice from [facility]. Please contact [facility] at [phone] with questions or concerns regarding your invoice.   Our billing staff will not be able to assist you with questions regarding bills from these companies.  You will be contacted with the lab results as soon as they are available. The fastest way to get your results is to activate your My Chart account. Instructions are located on the last page of this paperwork. If you have not heard from us regarding the results in 2 weeks, please contact this office.      Hypertension, Adult High blood pressure (hypertension) is when the force of blood pumping through the arteries is too strong. The arteries are the blood vessels that carry blood from the heart throughout the body. Hypertension forces the heart to work harder to pump blood and may cause arteries to become narrow or stiff. Untreated or uncontrolled hypertension can cause a heart attack, heart failure, a stroke, kidney disease, and other problems. A blood pressure reading consists of a higher number over a lower number. Ideally, your blood pressure should be below 120/80. The first ("top") number is called the systolic pressure. It is a measure of the pressure in your arteries as your heart beats. The second ("bottom") number is called the diastolic pressure. It is a measure of the pressure in your arteries as the heart relaxes. What are the causes? The exact cause of this condition is not known. There are some conditions  that result in or are related to high blood pressure. What increases the risk? Some risk factors for high blood pressure are under your control. The following factors may make you more likely to develop this condition:  Smoking.  Having type 2 diabetes mellitus, high cholesterol, or both.  Not getting enough exercise or physical activity.  Being overweight.  Having too much fat, sugar, calories, or salt (sodium) in your diet.  Drinking too much alcohol. Some risk factors for high blood pressure may be difficult or impossible to change. Some of these factors include:  Having chronic kidney disease.  Having a family history of high blood pressure.  Age. Risk increases with age.  Race. You may be at higher risk if you are African American.  Gender. Men are at higher risk than women before age 45. After age 65, women are at higher risk than men.  Having obstructive sleep apnea.  Stress. What are the signs or symptoms? High blood pressure may not cause symptoms. Very high blood pressure (hypertensive crisis) may cause:  Headache.  Anxiety.  Shortness of breath.  Nosebleed.  Nausea and vomiting.  Vision changes.  Severe chest pain.  Seizures. How is this diagnosed? This condition is diagnosed by measuring your blood pressure while you are seated, with your arm resting on a flat surface, your legs uncrossed, and your feet flat on the floor. The cuff of the blood pressure monitor will be placed directly against the skin of your upper arm at the level of your   heart. It should be measured at least twice using the same arm. Certain conditions can cause a difference in blood pressure between your right and left arms. Certain factors can cause blood pressure readings to be lower or higher than normal for a short period of time:  When your blood pressure is higher when you are in a health care provider's office than when you are at home, this is called white coat hypertension.  Most people with this condition do not need medicines.  When your blood pressure is higher at home than when you are in a health care provider's office, this is called masked hypertension. Most people with this condition may need medicines to control blood pressure. If you have a high blood pressure reading during one visit or you have normal blood pressure with other risk factors, you may be asked to:  Return on a different day to have your blood pressure checked again.  Monitor your blood pressure at home for 1 week or longer. If you are diagnosed with hypertension, you may have other blood or imaging tests to help your health care provider understand your overall risk for other conditions. How is this treated? This condition is treated by making healthy lifestyle changes, such as eating healthy foods, exercising more, and reducing your alcohol intake. Your health care provider may prescribe medicine if lifestyle changes are not enough to get your blood pressure under control, and if:  Your systolic blood pressure is above 130.  Your diastolic blood pressure is above 80. Your personal target blood pressure may vary depending on your medical conditions, your age, and other factors. Follow these instructions at home: Eating and drinking   Eat a diet that is high in fiber and potassium, and low in sodium, added sugar, and fat. An example eating plan is called the DASH (Dietary Approaches to Stop Hypertension) diet. To eat this way: ? Eat plenty of fresh fruits and vegetables. Try to fill one half of your plate at each meal with fruits and vegetables. ? Eat whole grains, such as whole-wheat pasta, brown rice, or whole-grain bread. Fill about one fourth of your plate with whole grains. ? Eat or drink low-fat dairy products, such as skim milk or low-fat yogurt. ? Avoid fatty cuts of meat, processed or cured meats, and poultry with skin. Fill about one fourth of your plate with lean proteins, such  as fish, chicken without skin, beans, eggs, or tofu. ? Avoid pre-made and processed foods. These tend to be higher in sodium, added sugar, and fat.  Reduce your daily sodium intake. Most people with hypertension should eat less than 1,500 mg of sodium a day.  Do not drink alcohol if: ? Your health care provider tells you not to drink. ? You are pregnant, may be pregnant, or are planning to become pregnant.  If you drink alcohol: ? Limit how much you use to:  0-1 drink a day for women.  0-2 drinks a day for men. ? Be aware of how much alcohol is in your drink. In the [place] one drink equals one 12 oz bottle of beer (355 mL), one 5 oz glass of wine (148 mL), or one 1 oz glass of hard liquor (44 mL). Lifestyle   Work with your health care provider to maintain a healthy body weight or to lose weight. Ask what an ideal weight is for you.  Get at least 30 minutes of exercise most days of the week. Activities may include walking, swimming,   or biking.  Include exercise to strengthen your muscles (resistance exercise), such as Pilates or lifting weights, as part of your weekly exercise routine. Try to do these types of exercises for 30 minutes at least 3 days a week.  Do not use any products that contain nicotine or tobacco, such as cigarettes, e-cigarettes, and chewing tobacco. If you need help quitting, ask your health care provider.  Monitor your blood pressure at home as told by your health care provider.  Keep all follow-up visits as told by your health care provider. This is important. Medicines  Take over-the-counter and prescription medicines only as told by your health care provider. Follow directions carefully. Blood pressure medicines must be taken as prescribed.  Do not skip doses of blood pressure medicine. Doing this puts you at risk for problems and can make the medicine less effective.  Ask your health care provider about side effects or reactions to medicines that you  should watch for. Contact a health care provider if you:  Think you are having a reaction to a medicine you are taking.  Have headaches that keep coming back (recurring).  Feel dizzy.  Have swelling in your ankles.  Have trouble with your vision. Get help right away if you:  Develop a severe headache or confusion.  Have unusual weakness or numbness.  Feel faint.  Have severe pain in your chest or abdomen.  Vomit repeatedly.  Have trouble breathing. Summary  Hypertension is when the force of blood pumping through your arteries is too strong. If this condition is not controlled, it may put you at risk for serious complications.  Your personal target blood pressure may vary depending on your medical conditions, your age, and other factors. For most people, a normal blood pressure is less than 120/80.  Hypertension is treated with lifestyle changes, medicines, or a combination of both. Lifestyle changes include losing weight, eating a healthy, low-sodium diet, exercising more, and limiting alcohol. This information is not intended to replace advice given to you by your health care provider. Make sure you discuss any questions you have with your health care provider. Document Revised: 02/12/2018 Document Reviewed: 02/12/2018 [employer] Patient Education  2020 [employer]  

## 2020-03-30 NOTE — Assessment & Plan Note (Signed)
Not well controlled hypertension.  Continue Tenormin 50 mg daily.  Will change losartan to losartan-HCTZ 50-12.5 mg daily.  Diet and nutrition discussed. Follow-up in 6 months.

## 2020-03-30 NOTE — Progress Notes (Signed)
Danielle Khan Danielle Khan 49 y.o.   Chief Complaint  Patient presents with  . Hypertension    follow up 3 month    HISTORY OF PRESENT ILLNESS: This is a 49 y.o. female with history of hypertension here for follow-up. Blood pressure readings at home elevated.   Presently on Tenormin 50 mg and losartan 50 mg daily. Has gained 11 pounds since last visit. No other complaints or medical concerns. Fully vaccinated against Covid. BP Readings from Last 3 Encounters:  03/30/20 (!) 154/85  12/24/19 (!) 158/83  10/27/19 (!) 141/87    HPI   Prior to Admission medications   Medication Sig Start Date End Date Taking? Authorizing Provider  atenolol (TENORMIN) 50 MG tablet Take 1 tablet by mouth once daily 01/13/20  Yes Aveen Stansel, [place], MD  Cyanocobalamin (VITAMIN B 12 PO) Take 5,000 mg by mouth every other day.   Yes [provider]  EUTHYROX 125 MCG tablet TAKE 1 TABLET BY MOUTH BEFORE BREAKFAST ONCE DAILY(EXCEPT HALF TABLET ON SUNDAYS) 03/17/20  Yes Reather Littler, MD  losartan (COZAAR) 50 MG tablet Take 1 tablet (50 mg total) by mouth daily. 06/24/19  Yes Jacorey Donaway, Eilleen Kempf, MD  psyllium (METAMUCIL) 58.6 % powder Take 1 packet by mouth as needed.   Yes [provider]  triamcinolone (NASACORT AQ) 55 MCG/ACT AERO nasal inhaler Place 2 sprays into the nose daily as needed. 06/09/18  Yes Peyton Najjar, MD    No Known Allergies  Patient Active Problem List   Diagnosis Date Noted  . Prediabetes 12/24/2019  . Anemia 12/24/2019  . 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 Hypertension 08/19/2013    Past Medical History:  Diagnosis Date  . Chronic constipation    uses metamucil  . GERD (gastroesophageal reflux disease)   . Hypertension   . Thyroid disease     Past Surgical History:  Procedure Laterality Date  . ABDOMINAL HYSTERECTOMY      Social History   Socioeconomic History  . Marital  status: Married    Spouse name: Not on file  . Number of children: Not on file  . Years of education: Not on file  . Highest education level: Not on file  Occupational History  . Not on file  Tobacco Use  . Smoking status: Never Smoker  . Smokeless tobacco: Current User    Types: Snuff  . Tobacco comment: last used 10/26/19  Substance and Sexual Activity  . Alcohol use: No    Alcohol/week: 0.0 standard drinks  . Drug use: No  . Sexual activity: Yes  Other Topics Concern  . Not on file  Social History Narrative  . Not on file   Social Determinants of Health   Financial Resource Strain:   . Difficulty of Paying Living Expenses: Not on file  Food Insecurity:   . Worried About Programme researcher, broadcasting/film/video in the Last Year: Not on file  . Ran Out of Food in the Last Year: Not on file  Transportation Needs:   . Lack of Transportation (Medical): Not on file  . Lack of Transportation (Non-Medical): Not on file  Physical Activity:   . Days of Exercise per Week: Not on file  . Minutes of Exercise per Session: Not on file  Stress:   . Feeling of Stress : Not on file  Social Connections:   . Frequency of Communication with Friends and Family: Not on file  . Frequency of Social Gatherings with  Friends and Family: Not on file  . Attends Religious Services: Not on file  . Active Member of Clubs or Organizations: Not on file  . Attends BankerClub or Organization Meetings: Not on file  . Marital Status: Not on file  Intimate Partner Violence:   . Fear of Current or Ex-Partner: Not on file  . Emotionally Abused: Not on file  . Physically Abused: Not on file  . Sexually Abused: Not on file    Family History  Problem Relation Age of Onset  . Breast cancer Maternal Aunt   . Ovarian cancer Maternal Aunt   . Colon cancer Neg Hx   . Colon polyps Neg Hx   . Esophageal cancer Neg Hx   . Rectal cancer Neg Hx   . Stomach cancer Neg Hx      Review of Systems  Constitutional: Negative.  Negative  for chills and fever.  HENT: Negative.  Negative for congestion and sore throat.   Respiratory: Negative.  Negative for cough and shortness of breath.   Cardiovascular: Negative.  Negative for chest pain and palpitations.  Gastrointestinal: Negative.  Negative for abdominal pain, blood in stool, diarrhea, heartburn, melena, nausea and vomiting.  Genitourinary: Negative.  Negative for dysuria and hematuria.  Musculoskeletal: Negative.  Negative for back pain, myalgias and neck pain.  Skin: Negative.   Neurological: Negative.  Negative for dizziness and headaches.  All other systems reviewed and are negative.  Today's Vitals   03/30/20 1531  BP: (!) 154/85  Pulse: 72  Resp: 16  Temp: 98.2 F (36.8 C)  TempSrc: Temporal  SpO2: 96%  Weight: 254 lb (115.2 kg)  Height: 5' 6.75" (1.695 m)   Body mass index is 40.08 kg/m. Wt Readings from Last 3 Encounters:  03/30/20 254 lb (115.2 kg)  12/24/19 243 lb (110.2 kg)  10/27/19 232 lb (105.2 kg)     Physical Exam Vitals reviewed.  Constitutional:      Appearance: She is obese.  HENT:     Head: Normocephalic.  Eyes:     Extraocular Movements: Extraocular movements intact.     Pupils: Pupils are equal, round, and reactive to light.  Cardiovascular:     Rate and Rhythm: Normal rate and regular rhythm.     Pulses: Normal pulses.     Heart sounds: Normal heart sounds.  Pulmonary:     Effort: Pulmonary effort is normal.     Breath sounds: Normal breath sounds.  Musculoskeletal:        General: Normal range of motion.     Cervical back: Normal range of motion and neck supple.     Right lower leg: No edema.     Left lower leg: No edema.  Skin:    General: Skin is warm and dry.     Capillary Refill: Capillary refill takes less than 2 seconds.  Neurological:     General: No focal deficit present.     Mental Status: She is alert and oriented to person, place, and time.  Psychiatric:        Mood and Affect: Mood normal.         Behavior: Behavior normal.     A total of 30 minutes was spent with the patient, greater than 50% of which was in counseling/coordination of care regarding hypertension and cardiovascular risks associated with this condition, review of all medications and changes to losartan-HCTZ, review of most recent office visit notes, review of most recent blood work results, education on nutrition,  documentation, prognosis and need for follow-up.  ASSESSMENT & PLAN: Hypertension Not well controlled hypertension.  Continue Tenormin 50 mg daily.  Will change losartan to losartan-HCTZ 50-12.5 mg daily.  Diet and nutrition discussed. Follow-up in 6 months.  Stephinie was seen today for hypertension.  Diagnoses and all orders for this visit:  Essential hypertension -     losartan-hydrochlorothiazide (HYZAAR) 50-12.5 MG tablet; Take 1 tablet by mouth daily.  Need for prophylactic vaccination and inoculation against influenza -     Flu Vaccine QUAD 36+ mos IM  Body mass index (BMI) of 40.1-44.9 in adult [facility])  Morbid obesity (HCC)    Patient Instructions       If you have lab work done today you will be contacted with your lab results within the next 2 weeks.  If you have not heard from [place] then please contact us. The fastest way to get your results is to register for My Chart.   IF you received an x-ray today, you will receive an invoice from [facility]. Please contact [facility] at [phone] with questions or concerns regarding your invoice.   IF you received labwork today, you will receive an invoice from [facility]. Please contact [facility] at [phone] with questions or concerns regarding your invoice.   Our billing staff will not be able to assist you with questions regarding bills from these companies.  You will be contacted with the lab results as soon as they are available. The fastest way to get your results is to activate your My Chart account. Instructions are  located on the last page of this paperwork. If you have not heard from [place] regarding the results in 2 weeks, please contact this office.     Hypertension, Adult High blood pressure (hypertension) is when the force of blood pumping through the arteries is too strong. The arteries are the blood vessels that carry blood from the heart throughout the body. Hypertension forces the heart to work harder to pump blood and may cause arteries to become narrow or stiff. Untreated or uncontrolled hypertension can cause a heart attack, heart failure, a stroke, kidney disease, and other problems. A blood pressure reading consists of a higher number over a lower number. Ideally, your blood pressure should be below 120/80. The first ("top") number is called the systolic pressure. It is a measure of the pressure in your arteries as your heart beats. The second ("bottom") number is called the diastolic pressure. It is a measure of the pressure in your arteries as the heart relaxes. What are the causes? The exact cause of this condition is not known. There are some conditions that result in or are related to high blood pressure. What increases the risk? Some risk factors for high blood pressure are under your control. The following factors may make you more likely to develop this condition:  Smoking.  Having type 2 diabetes mellitus, high cholesterol, or both.  Not getting enough exercise or physical activity.  Being overweight.  Having too much fat, sugar, calories, or salt (sodium) in your diet.  Drinking too much alcohol. Some risk factors for high blood pressure may be difficult or impossible to change. Some of these factors include:  Having chronic kidney disease.  Having a family history of high blood pressure.  Age. Risk increases with age.  Race. You may be at higher risk if you are African American.  Gender. Men are at higher risk than women before age 74. After age 19, women are  at higher risk  than men.  Having obstructive sleep apnea.  Stress. What are the signs or symptoms? High blood pressure may not cause symptoms. Very high blood pressure (hypertensive crisis) may cause:  Headache.  Anxiety.  Shortness of breath.  Nosebleed.  Nausea and vomiting.  Vision changes.  Severe chest pain.  Seizures. How is this diagnosed? This condition is diagnosed by measuring your blood pressure while you are seated, with your arm resting on a flat surface, your legs uncrossed, and your feet flat on the floor. The cuff of the blood pressure monitor will be placed directly against the skin of your upper arm at the level of your heart. It should be measured at least twice using the same arm. Certain conditions can cause a difference in blood pressure between your right and left arms. Certain factors can cause blood pressure readings to be lower or higher than normal for a short period of time:  When your blood pressure is higher when you are in a health care provider's office than when you are at home, this is called white coat hypertension. Most people with this condition do not need medicines.  When your blood pressure is higher at home than when you are in a health care provider's office, this is called masked hypertension. Most people with this condition may need medicines to control blood pressure. If you have a high blood pressure reading during one visit or you have normal blood pressure with other risk factors, you may be asked to:  Return on a different day to have your blood pressure checked again.  Monitor your blood pressure at home for 1 week or longer. If you are diagnosed with hypertension, you may have other blood or imaging tests to help your health care provider understand your overall risk for other conditions. How is this treated? This condition is treated by making healthy lifestyle changes, such as eating healthy foods, exercising more, and reducing your alcohol  intake. Your health care provider may prescribe medicine if lifestyle changes are not enough to get your blood pressure under control, and if:  Your systolic blood pressure is above 130.  Your diastolic blood pressure is above 80. Your personal target blood pressure may vary depending on your medical conditions, your age, and other factors. Follow these instructions at home: Eating and drinking   Eat a diet that is high in fiber and potassium, and low in sodium, added sugar, and fat. An example eating plan is called the DASH (Dietary Approaches to Stop Hypertension) diet. To eat this way: ? Eat plenty of fresh fruits and vegetables. Try to fill one half of your plate at each meal with fruits and vegetables. ? Eat whole grains, such as whole-wheat pasta, brown rice, or whole-grain bread. Fill about one fourth of your plate with whole grains. ? Eat or drink low-fat dairy products, such as skim milk or low-fat yogurt. ? Avoid fatty cuts of meat, processed or cured meats, and poultry with skin. Fill about one fourth of your plate with lean proteins, such as fish, chicken without skin, beans, eggs, or tofu. ? Avoid pre-made and processed foods. These tend to be higher in sodium, added sugar, and fat.  Reduce your daily sodium intake. Most people with hypertension should eat less than 1,500 mg of sodium a day.  Do not drink alcohol if: ? Your health care provider tells you not to drink. ? You are pregnant, may be pregnant, or are planning to become pregnant.  If you drink alcohol: ? Limit how much you use to:  0-1 drink a day for women.  0-2 drinks a day for men. ? Be aware of how much alcohol is in your drink. In the [place] one drink equals one 12 oz bottle of beer (355 mL), one 5 oz glass of wine (148 mL), or one 1 oz glass of hard liquor (44 mL). Lifestyle   Work with your health care provider to maintain a healthy body weight or to lose weight. Ask what an ideal weight is for  you.  Get at least 30 minutes of exercise most days of the week. Activities may include walking, swimming, or biking.  Include exercise to strengthen your muscles (resistance exercise), such as Pilates or lifting weights, as part of your weekly exercise routine. Try to do these types of exercises for 30 minutes at least 3 days a week.  Do not use any products that contain nicotine or tobacco, such as cigarettes, e-cigarettes, and chewing tobacco. If you need help quitting, ask your health care provider.  Monitor your blood pressure at home as told by your health care provider.  Keep all follow-up visits as told by your health care provider. This is important. Medicines  Take over-the-counter and prescription medicines only as told by your health care provider. Follow directions carefully. Blood pressure medicines must be taken as prescribed.  Do not skip doses of blood pressure medicine. Doing this puts you at risk for problems and can make the medicine less effective.  Ask your health care provider about side effects or reactions to medicines that you should watch for. Contact a health care provider if you:  Think you are having a reaction to a medicine you are taking.  Have headaches that keep coming back (recurring).  Feel dizzy.  Have swelling in your ankles.  Have trouble with your vision. Get help right away if you:  Develop a severe headache or confusion.  Have unusual weakness or numbness.  Feel faint.  Have severe pain in your chest or abdomen.  Vomit repeatedly.  Have trouble breathing. Summary  Hypertension is when the force of blood pumping through your arteries is too strong. If this condition is not controlled, it may put you at risk for serious complications.  Your personal target blood pressure may vary depending on your medical conditions, your age, and other factors. For most people, a normal blood pressure is less than 120/80.  Hypertension is  treated with lifestyle changes, medicines, or a combination of both. Lifestyle changes include losing weight, eating a healthy, low-sodium diet, exercising more, and limiting alcohol. This information is not intended to replace advice given to you by your health care provider. Make sure you discuss any questions you have with your health care provider. Document Revised: 02/12/2018 Document Reviewed: 02/12/2018 Elsevier Patient Education  2020 Elsevier Inc.      Edwina Barth, MD [facility]

## 2020-04-12 ENCOUNTER — Other Ambulatory Visit: Payer: Self-pay

## 2020-04-12 ENCOUNTER — Ambulatory Visit
Admission: RE | Admit: 2020-04-12 | Discharge: 2020-04-12 | Disposition: A | Discharge status: Home / Self Care | Payer: BC Managed Care – PPO | Source: Ambulatory Visit | Attending: Emergency Medicine | Admitting: Emergency Medicine

## 2020-04-12 DIAGNOSIS — Z1231 Encounter for screening mammogram for malignant neoplasm of breast: Secondary | ICD-10-CM

## 2020-04-27 ENCOUNTER — Other Ambulatory Visit: Payer: Self-pay

## 2020-04-27 ENCOUNTER — Other Ambulatory Visit (INDEPENDENT_AMBULATORY_CARE_PROVIDER_SITE_OTHER): Payer: BC Managed Care – PPO

## 2020-04-27 DIAGNOSIS — E890 Postprocedural hypothyroidism: Secondary | ICD-10-CM

## 2020-04-27 LAB — TSH: TSH: 7.95 u[IU]/mL — ABNORMAL HIGH (ref 0.35–4.50)

## 2020-04-27 LAB — T4, FREE: Free T4: 0.62 ng/dL (ref 0.60–1.60)

## 2020-05-03 ENCOUNTER — Other Ambulatory Visit: Payer: Self-pay

## 2020-05-03 ENCOUNTER — Ambulatory Visit (INDEPENDENT_AMBULATORY_CARE_PROVIDER_SITE_OTHER): Payer: BC Managed Care – PPO | Admitting: Endocrinology

## 2020-05-03 ENCOUNTER — Encounter: Payer: Self-pay | Admitting: Endocrinology

## 2020-05-03 VITALS — BP 138/86 | HR 69 | Ht 67.0 in | Wt 258.6 lb

## 2020-05-03 DIAGNOSIS — E890 Postprocedural hypothyroidism: Secondary | ICD-10-CM

## 2020-05-03 MED ORDER — EUTHYROX 137 MCG PO TABS: 137.0000 ug | ORAL_TABLET | Freq: Every day | ORAL | 3 refills | Status: DC

## 2020-05-03 NOTE — Progress Notes (Signed)
Patient ID: Danielle Khan, female   DOB: 08-Jul-1970, 49 y.o.   MRN: [id number]    Reason for Appointment:  Hypothyroidism, followup visit    History of Present Illness:   The patient was first seen in consultation in 03/2012 for management of her hyperthyroidism. She had had Graves' disease since the year 2001 and had been on long-term methimazole treatment but had not been on any dictations for 2 years prior to evaluation. Because of persistent hyperthyroidism and a free T4 level of 1.65 she was given 16.75 mCi of I-131 on 05/02/12  When seen for followup in 3/15 she was significantly hypothyroid with symptoms of feeling less cold, tired and  some hair shedding.  She initially given 150 mcg on her first visit    However because of relatively low TSH in 6/15  her dose was reduced to 125 mcg but subsequently went back to 150 mcg  RECENT HISTORY:  She has been on the 150 g levothyroxine dose, was told to take 6-1/2 tablets a week since 04/2015 However she had not been seen in follow-up between 09/2016 and  6/20  Subsequently her doses have been needing adjustments frequently  This is despite her trying to take her supplement very regularly every morning before breakfast on empty stomach  When her thyroid levels are low she usually is complaining of feeling cold and tired as well as having some hair loss However currently does not feel unusually tired or cold Has had some weight gain recently  She is still getting Euthyrox brand and is now taking 125 mcg, 6-1/2 tablets a week since her visit in 3/21 when her TSH was only 0.27 She has followed her medication routine and not missed any doses  TSH is unusually high at 7.95    Lab Results  Component Value Date   TSH 7.95 (H) 04/27/2020   TSH 0.27 (L) 09/07/2019   TSH 0.612 06/24/2019   FREET4 0.62 04/27/2020   FREET4 1.22 09/07/2019   FREET4 1.11 05/27/2019    Wt Readings from Last 3 Encounters:   05/03/20 258 lb 9.6 oz (117.3 kg)  03/30/20 254 lb (115.2 kg)  12/24/19 243 lb (110.2 kg)     Allergies as of 05/03/2020   No Known Allergies     Medication List       Accurate as of May 03, 2020  4:16 PM. If you have any questions, ask your nurse or doctor.        STOP taking these medications   Euthyrox 125 MCG tablet Generic drug: levothyroxine Stopped by: Danielle Littler, Danielle Khan     TAKE these medications   atenolol 50 MG tablet Commonly known as: TENORMIN Take 1 tablet by mouth once daily   losartan-hydrochlorothiazide 50-12.5 MG tablet Commonly known as: HYZAAR Take 1 tablet by mouth daily.   psyllium 58.6 % powder Commonly known as: METAMUCIL Take 1 packet by mouth as needed.   triamcinolone 55 MCG/ACT Aero nasal inhaler Commonly known as: Nasacort AQ Place 2 sprays into the nose daily as needed.   VITAMIN B 12 PO Take 5,000 mg by mouth every other day.       Allergies: No Known Allergies  Past Medical History:  Diagnosis Date  . Chronic constipation    uses metamucil  . GERD (gastroesophageal reflux disease)   . Hypertension   . Thyroid disease     Past Surgical History:  Procedure Laterality Date  . ABDOMINAL HYSTERECTOMY  Family History  Problem Relation Age of Onset  . Breast cancer Maternal Aunt   . Ovarian cancer Maternal Aunt   . Colon cancer Neg Hx   . Colon polyps Neg Hx   . Esophageal cancer Neg Hx   . Rectal cancer Neg Hx   . Stomach cancer Neg Hx     Social History:  reports that she has never smoked. Her smokeless tobacco use includes snuff. She reports that she does not drink alcohol and does not use drugs.  REVIEW Of SYSTEMS:   Hypertension: She has had significant hypertension followed by her PCP  BP Readings from Last 3 Encounters:  05/03/20 138/86  03/30/20 (!) 150/100  12/24/19 (!) 158/83   She has prediabetes Still has difficulty losing weight  Lab Results  Component Value Date   HGBA1C 6.2 (H)  12/24/2019   HGBA1C 6.2 (H) 06/24/2019   HGBA1C 6.0 (H) 11/26/2016   Lab Results  Component Value Date   MICROALBUR 1.56 08/15/2013   LDLCALC 118 (H) 12/24/2019   CREATININE 0.86 12/24/2019     Examination:   BP 138/86   Pulse 69   Ht 5\' 7"  (1.702 m)   Wt 258 lb 9.6 oz (117.3 kg)   SpO2 98%   BMI 40.50 kg/m       Assessment   Hypothyroidism, post ablative for several years  Her levothyroxine requirement has fluctuated between 125 and 150 mcg over the last couple of years  However even though her Euthyrox was reduced by half tablet weekly only when her TSH is was slightly low with 125 mcg daily TSH is now up to almost 8 Surprisingly has no symptoms of hypothyroidism now Has not missed any doses and is taking her medications as directed without any interacting supplements  Recently has been gaining weight    Treatment:  She will now switch to 137 mcg Euthyrox and follow-up in 2 months   Danielle Khan 05/03/2020, 4:16 PM

## 2020-06-14 ENCOUNTER — Other Ambulatory Visit: Payer: Self-pay

## 2020-06-14 ENCOUNTER — Telehealth (INDEPENDENT_AMBULATORY_CARE_PROVIDER_SITE_OTHER): Payer: BC Managed Care – PPO | Admitting: Registered Nurse

## 2020-06-14 ENCOUNTER — Encounter: Payer: Self-pay | Admitting: Registered Nurse

## 2020-06-14 DIAGNOSIS — Z20822 Contact with and (suspected) exposure to covid-19: Secondary | ICD-10-CM | POA: Diagnosis not present

## 2020-06-14 DIAGNOSIS — M791 Myalgia, unspecified site: Secondary | ICD-10-CM

## 2020-06-14 NOTE — Patient Instructions (Signed)
° ° ° °  If you have lab work done today you will be contacted with your lab results within the next 2 weeks.  If you have not heard from us then please contact us. The fastest way to get your results is to register for My Chart. ° ° °IF you received an x-ray today, you will receive an invoice from [facility]. Please contact [facility] at [phone] with questions or concerns regarding your invoice.  ° °IF you received labwork today, you will receive an invoice from [facility]. Please contact [facility] at [phone] with questions or concerns regarding your invoice.  ° °Our billing staff will not be able to assist you with questions regarding bills from these companies. ° °You will be contacted with the lab results as soon as they are available. The fastest way to get your results is to activate your My Chart account. Instructions are located on the last page of this paperwork. If you have not heard from us regarding the results in 2 weeks, please contact this office. °  ° ° ° °

## 2020-06-14 NOTE — Progress Notes (Signed)
Telemedicine Encounter- SOAP NOTE Established Patient  This telephone encounter was conducted with the patient's (or proxy's) verbal consent via audio telecommunications: yes  Patient was instructed to have this encounter in a suitably private space; and to only have persons present to whom they give permission to participate. In addition, patient identity was confirmed by use of name plus two identifiers (DOB and address).  I discussed the limitations, risks, security and privacy concerns of performing an evaluation and management service by telephone and the availability of in person appointments. I also discussed with the patient that there may be a patient responsible charge related to this service. The patient expressed understanding and agreed to proceed.  I spent a total of 15 minutes talking with the patient or their proxy.  Patient at home Provider in office  Chief Complaint  Patient presents with   Covid Exposure    Patient states she has been expose to covid by both of her daughters, and also they traveled for christmas and 5 other people are positive. Per patient she has noticed she has been experiencing some body ache but thought she was tired from work.    Subjective   Danielle Khan is a 49 y.o. established patient. Telephone visit today for covid exposure  HPI Christmas Eve gathering has had 5 relatives and both daughters of patient who have tested positive for covid, mostly mild symptoms Pt notes she has had some aches - concern that she has been exposed / is infected Staying distanced from daughters despite living together, wearing masks in common spaces No shob, doe, headaches, sensory changes, upper or lower respiratory congestion, nvd, chest pain, fevers, chills, or other symptoms.  No further concerns  Has been taking tylenol with some relief  Needs work note  covid vaccines completed in March and April 2021 - no booster - did receive flu  shot. 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     EUTHYROX 137 MCG tablet Take 1 tablet (137 mcg total) by mouth daily before breakfast. 30 tablet 3   losartan-hydrochlorothiazide (HYZAAR) 50-12.5 MG tablet Take 1 tablet by mouth daily. 90 tablet 3   atenolol (TENORMIN) 50 MG tablet Take 1 tablet by mouth once daily 90 tablet 1   psyllium (METAMUCIL) 58.6 % powder Take 1 packet by mouth as needed. (Patient not taking: Reported on 06/14/2020)     triamcinolone (NASACORT AQ) 55 MCG/ACT AERO nasal inhaler Place 2 sprays into the nose daily as needed. (Patient not taking: Reported on 06/14/2020) 1 Inhaler 5   No current facility-administered medications for this visit.    No Known Allergies  Social History   Socioeconomic History   Marital status: Married    Spouse name: Not on file   Number of children: Not on file   Years of education: Not on file   Highest education level: Not on file  Occupational History   Not on file  Tobacco Use   Smoking  status: Never Smoker   Smokeless tobacco: Current User    Types: Snuff   Tobacco comment: last used 10/26/19  Substance and Sexual Activity   Alcohol use: No    Alcohol/week: 0.0 standard drinks   Drug use: No   Sexual activity: Yes  Other Topics Concern   Not on file  Social History Narrative   Not on file   Social Determinants of Health   Financial Resource Strain: Not on file  Food Insecurity: Not on file   Transportation Needs: Not on file  Physical Activity: Not on file  Stress: Not on file  Social Connections: Not on file  Intimate Partner Violence: Not on file    ROS Per hpi   Objective   Vitals as reported by the patient: There were no vitals filed for this visit.  Danielle Khan was seen today for covid exposure.  Diagnoses and all orders for this visit:  Exposure to COVID-19 virus -     COVID-19, Flu A+B and RSV  Myalgia -     COVID-19, Flu A+B and RSV   PLAN  Strongly suspect covid infection. Mild symptoms thus far. Tylenol and rest for management  Pt to present for drive up testing  Continue isolation  Return to work no sooner than next Tuesday. Note to be given when patient presents for drive up testing  Advised on ER precautions  Patient encouraged to call clinic with any questions, comments, or concerns.  I discussed the assessment and treatment plan with the patient. The patient was provided an opportunity to ask questions and all were answered. The patient agreed with the plan and demonstrated an understanding of the instructions.   The patient was advised to call back or seek an in-person evaluation if the symptoms worsen or if the condition fails to improve as anticipated.  I provided 15 minutes of non-face-to-face time during this encounter.  Janeece Agee, NP  [facility]

## 2020-06-16 LAB — COVID-19, FLU A+B AND RSV
Influenza A, NAA: NOT DETECTED
Influenza B, NAA: NOT DETECTED
RSV, NAA: NOT DETECTED
SARS-CoV-2, NAA: NOT DETECTED

## 2020-06-23 ENCOUNTER — Other Ambulatory Visit (INDEPENDENT_AMBULATORY_CARE_PROVIDER_SITE_OTHER): Payer: BC Managed Care – PPO

## 2020-06-23 ENCOUNTER — Other Ambulatory Visit: Payer: Self-pay

## 2020-06-23 DIAGNOSIS — E890 Postprocedural hypothyroidism: Secondary | ICD-10-CM

## 2020-06-23 LAB — TSH: TSH: 0.51 u[IU]/mL (ref 0.35–4.50)

## 2020-06-23 LAB — T4, FREE: Free T4: 1.21 ng/dL (ref 0.60–1.60)

## 2020-06-29 ENCOUNTER — Ambulatory Visit: Payer: BC Managed Care – PPO | Admitting: Endocrinology

## 2020-07-05 ENCOUNTER — Ambulatory Visit: Payer: BC Managed Care – PPO | Admitting: Endocrinology

## 2020-07-11 ENCOUNTER — Ambulatory Visit (INDEPENDENT_AMBULATORY_CARE_PROVIDER_SITE_OTHER): Payer: BC Managed Care – PPO | Admitting: Endocrinology

## 2020-07-11 ENCOUNTER — Other Ambulatory Visit: Payer: Self-pay

## 2020-07-11 ENCOUNTER — Other Ambulatory Visit: Payer: Self-pay | Admitting: Endocrinology

## 2020-07-11 ENCOUNTER — Encounter: Payer: Self-pay | Admitting: Endocrinology

## 2020-07-11 VITALS — BP 126/84 | HR 72 | Ht 67.0 in | Wt 258.0 lb

## 2020-07-11 DIAGNOSIS — E890 Postprocedural hypothyroidism: Secondary | ICD-10-CM

## 2020-07-11 MED ORDER — EUTHYROX 137 MCG PO TABS
137.0000 ug | ORAL_TABLET | Freq: Every day | ORAL | 2 refills | Status: DC
Start: 2020-07-11 — End: 2021-04-24

## 2020-07-11 NOTE — Progress Notes (Signed)
Patient ID: Danielle Khan, female   DOB: February 18, 1971, 50 y.o.   MRN: [id number]    Reason for Appointment:  Hypothyroidism, followup visit    History of Present Illness:   The patient was first seen in consultation in 03/2012 for management of her hyperthyroidism. She had had Graves' disease since the year 2001 and had been on long-term methimazole treatment but had not been on any dictations for 2 years prior to evaluation. Because of persistent hyperthyroidism and a free T4 level of 1.65 she was given 16.75 mCi of I-131 on 05/02/12  When seen for followup in 3/15 she was significantly hypothyroid with symptoms of feeling less cold, tired and  some hair shedding.  She initially given 150 mcg on her first visit    However because of relatively low TSH in 6/15  her dose was reduced to 125 mcg but subsequently went back to 150 mcg  RECENT HISTORY:  She has been on the 150 g levothyroxine dose, was told to take 6-1/2 tablets a week since 04/2015 However she had not been seen in follow-up between 09/2016 and  6/20  Subsequently her doses have been needing adjustments frequently  This is despite her trying to take her levothyroxine daily every morning before breakfast on empty stomach  With hypothyroidism she usually is complaining of feeling cold and tired as well as having some hair loss She thinks that she is gaining weight from about increased appetite However she does feel less tired since her dose was increased on her last visit in 11/21  She is still getting Euthyrox brand and is now taking 137 mcg  Overall feels fairly good, no recent hair loss  TSH which was unusually high at 7.95 in 11/21 is now 0.5    Lab Results  Component Value Date   TSH 0.51 06/23/2020   TSH 7.95 (H) 04/27/2020   TSH 0.27 (L) 09/07/2019   FREET4 1.21 06/23/2020   FREET4 0.62 04/27/2020   FREET4 1.22 09/07/2019    Wt Readings from Last 3 Encounters:  07/11/20 258 lb  (117 kg)  05/03/20 258 lb 9.6 oz (117.3 kg)  03/30/20 254 lb (115.2 kg)     Allergies as of 07/11/2020   No Known Allergies     Medication List       Accurate as of July 11, 2020 10:25 AM. If you have any questions, ask your nurse or doctor.        atenolol 50 MG tablet Commonly known as: TENORMIN Take 1 tablet by mouth once daily   Euthyrox 137 MCG tablet Generic drug: levothyroxine Take 1 tablet (137 mcg total) by mouth daily before breakfast.   losartan-hydrochlorothiazide 50-12.5 MG tablet Commonly known as: HYZAAR Take 1 tablet by mouth daily.   psyllium 58.6 % powder Commonly known as: METAMUCIL Take 1 packet by mouth as needed.   triamcinolone 55 MCG/ACT Aero nasal inhaler Commonly known as: Nasacort AQ Place 2 sprays into the nose daily as needed.   VITAMIN B 12 PO Take 5,000 mg by mouth every other day.       Allergies: No Known Allergies  Past Medical History:  Diagnosis Date  . Chronic constipation    uses metamucil  . GERD (gastroesophageal reflux disease)   . Hypertension   . Thyroid disease     Past Surgical History:  Procedure Laterality Date  . ABDOMINAL HYSTERECTOMY      Family History  Problem Relation Age of Onset  .  Breast cancer Maternal Aunt   . Ovarian cancer Maternal Aunt   . Colon cancer Neg Hx   . Colon polyps Neg Hx   . Esophageal cancer Neg Hx   . Rectal cancer Neg Hx   . Stomach cancer Neg Hx     Social History:  reports that she has never smoked. Her smokeless tobacco use includes snuff. She reports that she does not drink alcohol and does not use drugs.  REVIEW Of SYSTEMS:   Hypertension: She has had significant hypertension followed by her PCP  Home BP 140/85  BP Readings from Last 3 Encounters:  07/11/20 126/84  05/03/20 138/86  03/30/20 (!) 150/100   She has prediabetes Still has difficulty losing weight  Lab Results  Component Value Date   HGBA1C 6.2 (H) 12/24/2019   HGBA1C 6.2 (H)  06/24/2019   HGBA1C 6.0 (H) 11/26/2016   Lab Results  Component Value Date   MICROALBUR 1.56 08/15/2013   LDLCALC 118 (H) 12/24/2019   CREATININE 0.86 12/24/2019     Examination:   BP 126/84   Pulse 72   Ht 5\' 7"  (1.702 m)   Wt 258 lb (117 kg)   SpO2 99%   BMI 40.41 kg/m       Assessment   Hypothyroidism, post ablative for several years  Her levothyroxine requirement has fluctuated between 125 and 150 mcg over the last couple of years  She is getting consistent brand of Euthyrox Currently with 137 mcg her TSH is normal, previously about 8 In the last few months has been doing Since her TSH tend to fluctuate will need to continue the same dose for now without any adjustments  For her weight gain she will need to discuss with her PCP, also discussed importance of weight loss for benefits on her other comorbid conditions    Treatment:  She will continue with 137 mcg Euthyrox and follow-up in 6 months   Rashidi Loh 07/11/2020, 10:25 AM

## 2020-07-20 ENCOUNTER — Other Ambulatory Visit: Payer: Self-pay | Admitting: Emergency Medicine

## 2020-07-20 DIAGNOSIS — I10 Essential (primary) hypertension: Secondary | ICD-10-CM

## 2020-09-28 ENCOUNTER — Ambulatory Visit: Payer: BC Managed Care – PPO | Admitting: Emergency Medicine

## 2020-10-03 ENCOUNTER — Ambulatory Visit: Payer: Self-pay | Admitting: Emergency Medicine

## 2020-10-06 ENCOUNTER — Encounter: Payer: Self-pay | Admitting: Emergency Medicine

## 2020-10-06 ENCOUNTER — Other Ambulatory Visit: Payer: Self-pay

## 2020-10-06 ENCOUNTER — Ambulatory Visit: Payer: BC Managed Care – PPO | Admitting: Emergency Medicine

## 2020-10-06 VITALS — BP 106/64 | HR 73 | Temp 98.4°F | Ht 66.0 in | Wt 257.4 lb

## 2020-10-06 DIAGNOSIS — E890 Postprocedural hypothyroidism: Secondary | ICD-10-CM

## 2020-10-06 DIAGNOSIS — R7303 Prediabetes: Secondary | ICD-10-CM | POA: Diagnosis not present

## 2020-10-06 DIAGNOSIS — I10 Essential (primary) hypertension: Secondary | ICD-10-CM

## 2020-10-06 NOTE — Assessment & Plan Note (Signed)
Well-controlled hypertension.  Continue present medications.  No changes. Diet and nutrition discussed. Follow-up in 6 months. 

## 2020-10-06 NOTE — Progress Notes (Signed)
Danielle Khan Danielle Khan 50 y.o.   Chief Complaint  Patient presents with  . Hypertension    Follow up     HISTORY OF PRESENT ILLNESS: This is a 50 y.o. female with history of hypertension here for follow-up. Doing well.  Has no complaints or medical concerns. Presently taking atenolol 50 mg daily and Hyzaar 50-12.5 mg daily. Also has history of hypothyroidism on levothyroxine 137 mcg.  HPI   Prior to Admission medications   Medication Sig Start Date End Date Taking? Authorizing Provider  atenolol (TENORMIN) 50 MG tablet Take 1 tablet by mouth once daily 07/20/20   Georgina Quint, MD  Cyanocobalamin (VITAMIN B 12 PO) Take 5,000 mg by mouth every other day.    [provider]  EUTHYROX 137 MCG tablet Take 1 tablet (137 mcg total) by mouth daily before breakfast. 07/11/20   Reather Littler, MD  losartan-hydrochlorothiazide (HYZAAR) 50-12.5 MG tablet Take 1 tablet by mouth daily. 03/30/20   Georgina Quint, MD  psyllium (METAMUCIL) 58.6 % powder Take 1 packet by mouth as needed.    [provider]  triamcinolone (NASACORT AQ) 55 MCG/ACT AERO nasal inhaler Place 2 sprays into the nose daily as needed. Patient not taking: No sig reported 06/09/18   Peyton Najjar, MD    No Known Allergies  Patient Active Problem List   Diagnosis Date Noted  . Prediabetes 12/24/2019  . Anemia 12/24/2019  . 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 Hypertension 08/19/2013    Past Medical History:  Diagnosis Date  . Chronic constipation    uses metamucil  . GERD (gastroesophageal reflux disease)   . Hypertension   . Thyroid disease     Past Surgical History:  Procedure Laterality Date  . ABDOMINAL HYSTERECTOMY      Social History   Socioeconomic History  . Marital status: Married    Spouse name: Not on file  . Number of children: Not on file  . Years of education: Not on file  . Highest  education level: Not on file  Occupational History  . Not on file  Tobacco Use  . Smoking status: Never Smoker  . Smokeless tobacco: Current User    Types: Snuff  . Tobacco comment: last used 10/26/19  Substance and Sexual Activity  . Alcohol use: No    Alcohol/week: 0.0 standard drinks  . Drug use: No  . Sexual activity: Yes  Other Topics Concern  . Not on file  Social History Narrative  . Not on file   Social Determinants of Health   Financial Resource Strain: Not on file  Food Insecurity: Not on file  Transportation Needs: Not on file  Physical Activity: Not on file  Stress: Not on file  Social Connections: Not on file  Intimate Partner Violence: Not on file    Family History  Problem Relation Age of Onset  . Breast cancer Maternal Aunt   . Ovarian cancer Maternal Aunt   . Colon cancer Neg Hx   . Colon polyps Neg Hx   . Esophageal cancer Neg Hx   . Rectal cancer Neg Hx   . Stomach cancer Neg Hx      Review of Systems  Constitutional: Negative.  Negative for chills and fever.  HENT: Negative.  Negative for congestion and sore throat.   Respiratory: Negative.  Negative for cough and shortness of breath.   Cardiovascular: Negative.  Negative for chest pain and palpitations.  Gastrointestinal:  Negative.  Negative for abdominal pain, diarrhea, nausea and vomiting.  Genitourinary: Negative.  Negative for dysuria and hematuria.  Skin: Negative.  Negative for rash.  Neurological: Negative.  Negative for dizziness and headaches.  All other systems reviewed and are negative.     Today's Vitals   10/06/20 1115  BP: 106/64  Pulse: 73  Temp: 98.4 F (36.9 C)  TempSrc: Oral  SpO2: 98%  Weight: 257 lb 6.4 oz (116.8 kg)  Height:  (1.676 m)   Body mass index is 41.55 kg/m. Wt Readings from Last 3 Encounters:  10/06/20 257 lb 6.4 oz (116.8 kg)  07/11/20 258 lb (117 kg)  05/03/20 258 lb 9.6 oz (117.3 kg)    Physical Exam Vitals reviewed.   Constitutional:      Appearance: Normal appearance.  HENT:     Head: Normocephalic.  Eyes:     Extraocular Movements: Extraocular movements intact.     Conjunctiva/sclera: Conjunctivae normal.     Pupils: Pupils are equal, round, and reactive to light.  Cardiovascular:     Rate and Rhythm: Normal rate and regular rhythm.     Pulses: Normal pulses.     Heart sounds: Normal heart sounds.  Pulmonary:     Effort: Pulmonary effort is normal.     Breath sounds: Normal breath sounds.  Musculoskeletal:        General: Normal range of motion.     Cervical back: Normal range of motion and neck supple.     Right lower leg: No edema.     Left lower leg: No edema.  Skin:    General: Skin is warm and dry.     Capillary Refill: Capillary refill takes less than 2 seconds.  Neurological:     General: No focal deficit present.     Mental Status: She is alert and oriented to person, place, and time.  Psychiatric:        Mood and Affect: Mood normal.        Behavior: Behavior normal.    A total of 30 minutes was spent with the patient, greater than 50% of which was in counseling/coordination of care regarding hypertension and cardiovascular risks associated with this condition, review of all medications, review of most recent office visit notes, review of most recent blood work results, education on nutrition, prognosis, documentation and need for follow-up.   ASSESSMENT & PLAN: Hypertension Well-controlled hypertension.  Continue present medications.  No changes.  Diet and nutrition discussed.  Follow-up in 6 months. Cale was seen today for hypertension.  Diagnoses and all orders for this visit:  Primary hypertension  Prediabetes  Hypothyroidism, postradioiodine therapy  Morbid obesity [facility])    Patient Instructions   [email or web address]">  DASH Eating Plan DASH stands for Dietary Approaches to Stop Hypertension. The DASH eating  plan is a healthy eating plan that has been shown to:  Reduce high blood pressure (hypertension).  Reduce your risk for type 2 diabetes, heart disease, and stroke.  Help with weight loss. What are tips for following this plan? Reading food labels  Check food labels for the amount of salt (sodium) per serving. Choose foods with less than 5 percent of the Daily Value of sodium. Generally, foods with less than 300 milligrams (mg) of sodium per serving fit into this eating plan.  To find whole grains, look for the word "whole" as the first word in the ingredient list. Shopping  Buy products labeled as "low-sodium" or "no salt added."  Buy  fresh foods. Avoid canned foods and pre-made or frozen meals. Cooking  Avoid adding salt when cooking. Use salt-free seasonings or herbs instead of table salt or sea salt. Check with your health care provider or pharmacist before using salt substitutes.  Do not fry foods. Cook foods using healthy methods such as baking, boiling, grilling, roasting, and broiling instead.  Cook with heart-healthy oils, such as olive, canola, avocado, soybean, or sunflower oil. Meal planning  Eat a balanced diet that includes: ? 4 or more servings of fruits and 4 or more servings of vegetables each day. Try to fill one-half of your plate with fruits and vegetables. ? 6-8 servings of whole grains each day. ? Less than 6 oz (170 g) of lean meat, poultry, or fish each day. A 3-oz (85-g) serving of meat is about the same size as a deck of cards. One egg equals 1 oz (28 g). ? 2-3 servings of low-fat dairy each day. One serving is 1 cup (237 mL). ? 1 serving of nuts, seeds, or beans 5 times each week. ? 2-3 servings of heart-healthy fats. Healthy fats called omega-3 fatty acids are found in foods such as walnuts, flaxseeds, fortified milks, and eggs. These fats are also found in cold-water fish, such as sardines, salmon, and mackerel.  Limit how much you eat of: ? Canned or  prepackaged foods. ? Food that is high in trans fat, such as some fried foods. ? Food that is high in saturated fat, such as fatty meat. ? Desserts and other sweets, sugary drinks, and other foods with added sugar. ? Full-fat dairy products.  Do not salt foods before eating.  Do not eat more than 4 egg yolks a week.  Try to eat at least 2 vegetarian meals a week.  Eat more home-cooked food and less restaurant, buffet, and fast food.   Lifestyle  When eating at a restaurant, ask that your food be prepared with less salt or no salt, if possible.  If you drink alcohol: ? Limit how much you use to:  0-1 drink a day for women who are not pregnant.  0-2 drinks a day for men. ? Be aware of how much alcohol is in your drink. In the [place] one drink equals one 12 oz bottle of beer (355 mL), one 5 oz glass of wine (148 mL), or one 1 oz glass of hard liquor (44 mL). General information  Avoid eating more than 2,300 mg of salt a day. If you have hypertension, you may need to reduce your sodium intake to 1,500 mg a day.  Work with your health care provider to maintain a healthy body weight or to lose weight. Ask what an ideal weight is for you.  Get at least 30 minutes of exercise that causes your heart to beat faster (aerobic exercise) most days of the week. Activities may include walking, swimming, or biking.  Work with your health care provider or dietitian to adjust your eating plan to your individual calorie needs. What foods should I eat? Fruits All fresh, dried, or frozen fruit. Canned fruit in natural juice (without added sugar). Vegetables Fresh or frozen vegetables (raw, steamed, roasted, or grilled). Low-sodium or reduced-sodium tomato and vegetable juice. Low-sodium or reduced-sodium tomato sauce and tomato paste. Low-sodium or reduced-sodium canned vegetables. Grains Whole-grain or whole-wheat bread. Whole-grain or whole-wheat pasta. Brown rice. Orpah Cobb. Bulgur.  Whole-grain and low-sodium cereals. Pita bread. Low-fat, low-sodium crackers. Whole-wheat flour tortillas. Meats and other proteins Skinless chicken or  [place]urkey. Ground chicken or [place]key. Pork with fat trimmed off. Fish and seafood. Egg whites. Dried beans, peas, or lentils. Unsalted nuts, nut butters, and seeds. Unsalted canned beans. Lean cuts of beef with fat trimmed off. Low-sodium, lean precooked or cured meat, such as sausages or meat loaves. Dairy Low-fat (1%) or fat-free (skim) milk. Reduced-fat, low-fat, or fat-free cheeses. Nonfat, low-sodium ricotta or cottage cheese. Low-fat or nonfat yogurt. Low-fat, low-sodium cheese. Fats and oils Soft margarine without trans fats. Vegetable oil. Reduced-fat, low-fat, or light mayonnaise and salad dressings (reduced-sodium). Canola, safflower, olive, avocado, soybean, and sunflower oils. Avocado. Seasonings and condiments Herbs. Spices. Seasoning mixes without salt. Other foods Unsalted popcorn and pretzels. Fat-free sweets. The items listed above may not be a complete list of foods and beverages you can eat. Contact a dietitian for more information. What foods should I avoid? Fruits Canned fruit in a light or heavy syrup. Fried fruit. Fruit in cream or butter sauce. Vegetables Creamed or fried vegetables. Vegetables in a cheese sauce. Regular canned vegetables (not low-sodium or reduced-sodium). Regular canned tomato sauce and paste (not low-sodium or reduced-sodium). Regular tomato and vegetable juice (not low-sodium or reduced-sodium). Rosita FirePickles. Olives. Grains Baked goods made with fat, such as croissants, muffins, or some breads. Dry pasta or rice meal packs. Meats and other proteins Fatty cuts of meat. Ribs. Fried meat. Tomasa BlaseBacon. Bologna, salami, and other precooked or cured meats, such as sausages or meat loaves. Fat from the back of a pig (fatback). Bratwurst. Salted nuts and seeds. Canned beans with added salt. Canned or smoked fish. Whole eggs  or egg yolks. Chicken or [place]key with skin. Dairy Whole or 2% milk, cream, and half-and-half. Whole or full-fat cream cheese. Whole-fat or sweetened yogurt. Full-fat cheese. Nondairy creamers. Whipped toppings. Processed cheese and cheese spreads. Fats and oils Butter. Stick margarine. Lard. Shortening. Ghee. Bacon fat. Tropical oils, such as coconut, palm kernel, or palm oil. Seasonings and condiments Onion salt, garlic salt, seasoned salt, table salt, and sea salt. Worcestershire sauce. Tartar sauce. Barbecue sauce. Teriyaki sauce. Soy sauce, including reduced-sodium. Steak sauce. Canned and packaged gravies. Fish sauce. Oyster sauce. Cocktail sauce. Store-bought horseradish. Ketchup. Mustard. Meat flavorings and tenderizers. Bouillon cubes. Hot sauces. Pre-made or packaged marinades. Pre-made or packaged taco seasonings. Relishes. Regular salad dressings. Other foods Salted popcorn and pretzels. The items listed above may not be a complete list of foods and beverages you should avoid. Contact a dietitian for more information. Where to find more information  National Heart, Lung, and Blood Institute: PopSteam.iswww.nhlbi.nih.gov  American Heart Association: www.heart.org  Academy of Nutrition and Dietetics: www.eatright.org  National Kidney Foundation: www.kidney.org Summary  The DASH eating plan is a healthy eating plan that has been shown to reduce high blood pressure (hypertension). It may also reduce your risk for type 2 diabetes, heart disease, and stroke.  When on the DASH eating plan, aim to eat more fresh fruits and vegetables, whole grains, lean proteins, low-fat dairy, and heart-healthy fats.  With the DASH eating plan, you should limit salt (sodium) intake to 2,300 mg a day. If you have hypertension, you may need to reduce your sodium intake to 1,500 mg a day.  Work with your health care provider or dietitian to adjust your eating plan to your individual calorie needs. This information  is not intended to replace advice given to you by your health care provider. Make sure you discuss any questions you have with your health care provider. Document Revised: 05/08/2019 Document Reviewed: 05/08/2019 Elsevier  Patient Education  2021 Elsevier Inc. Hypertension, Adult High blood pressure (hypertension) is when the force of blood pumping through the arteries is too strong. The arteries are the blood vessels that carry blood from the heart throughout the body. Hypertension forces the heart to work harder to pump blood and may cause arteries to become narrow or stiff. Untreated or uncontrolled hypertension can cause a heart attack, heart failure, a stroke, kidney disease, and other problems. A blood pressure reading consists of a higher number over a lower number. Ideally, your blood pressure should be below 120/80. The first ("top") number is called the systolic pressure. It is a measure of the pressure in your arteries as your heart beats. The second ("bottom") number is called the diastolic pressure. It is a measure of the pressure in your arteries as the heart relaxes. What are the causes? The exact cause of this condition is not known. There are some conditions that result in or are related to high blood pressure. What increases the risk? Some risk factors for high blood pressure are under your control. The following factors may make you more likely to develop this condition:  Smoking.  Having type 2 diabetes mellitus, high cholesterol, or both.  Not getting enough exercise or physical activity.  Being overweight.  Having too much fat, sugar, calories, or salt (sodium) in your diet.  Drinking too much alcohol. Some risk factors for high blood pressure may be difficult or impossible to change. Some of these factors include:  Having chronic kidney disease.  Having a family history of high blood pressure.  Age. Risk increases with age.  Race. You may be at higher risk if you  are African American.  Gender. Men are at higher risk than women before age 42. After age 56, women are at higher risk than men.  Having obstructive sleep apnea.  Stress. What are the signs or symptoms? High blood pressure may not cause symptoms. Very high blood pressure (hypertensive crisis) may cause:  Headache.  Anxiety.  Shortness of breath.  Nosebleed.  Nausea and vomiting.  Vision changes.  Severe chest pain.  Seizures. How is this diagnosed? This condition is diagnosed by measuring your blood pressure while you are seated, with your arm resting on a flat surface, your legs uncrossed, and your feet flat on the floor. The cuff of the blood pressure monitor will be placed directly against the skin of your upper arm at the level of your heart. It should be measured at least twice using the same arm. Certain conditions can cause a difference in blood pressure between your right and left arms. Certain factors can cause blood pressure readings to be lower or higher than normal for a short period of time:  When your blood pressure is higher when you are in a health care provider's office than when you are at home, this is called white coat hypertension. Most people with this condition do not need medicines.  When your blood pressure is higher at home than when you are in a health care provider's office, this is called masked hypertension. Most people with this condition may need medicines to control blood pressure. If you have a high blood pressure reading during one visit or you have normal blood pressure with other risk factors, you may be asked to:  Return on a different day to have your blood pressure checked again.  Monitor your blood pressure at home for 1 week or longer. If you are diagnosed with hypertension,  you may have other blood or imaging tests to help your health care provider understand your overall risk for other conditions. How is this treated? This condition  is treated by making healthy lifestyle changes, such as eating healthy foods, exercising more, and reducing your alcohol intake. Your health care provider may prescribe medicine if lifestyle changes are not enough to get your blood pressure under control, and if:  Your systolic blood pressure is above 130.  Your diastolic blood pressure is above 80. Your personal target blood pressure may vary depending on your medical conditions, your age, and other factors. Follow these instructions at home: Eating and drinking  Eat a diet that is high in fiber and potassium, and low in sodium, added sugar, and fat. An example eating plan is called the DASH (Dietary Approaches to Stop Hypertension) diet. To eat this way: ? Eat plenty of fresh fruits and vegetables. Try to fill one half of your plate at each meal with fruits and vegetables. ? Eat whole grains, such as whole-wheat pasta, brown rice, or whole-grain bread. Fill about one fourth of your plate with whole grains. ? Eat or drink low-fat dairy products, such as skim milk or low-fat yogurt. ? Avoid fatty cuts of meat, processed or cured meats, and poultry with skin. Fill about one fourth of your plate with lean proteins, such as fish, chicken without skin, beans, eggs, or tofu. ? Avoid pre-made and processed foods. These tend to be higher in sodium, added sugar, and fat.  Reduce your daily sodium intake. Most people with hypertension should eat less than 1,500 mg of sodium a day.  Do not drink alcohol if: ? Your health care provider tells you not to drink. ? You are pregnant, may be pregnant, or are planning to become pregnant.  If you drink alcohol: ? Limit how much you use to:  0-1 drink a day for women.  0-2 drinks a day for men. ? Be aware of how much alcohol is in your drink. In the [place] one drink equals one 12 oz bottle of beer (355 mL), one 5 oz glass of wine (148 mL), or one 1 oz glass of hard liquor (44 mL).   Lifestyle  Work with  your health care provider to maintain a healthy body weight or to lose weight. Ask what an ideal weight is for you.  Get at least 30 minutes of exercise most days of the week. Activities may include walking, swimming, or biking.  Include exercise to strengthen your muscles (resistance exercise), such as Pilates or lifting weights, as part of your weekly exercise routine. Try to do these types of exercises for 30 minutes at least 3 days a week.  Do not use any products that contain nicotine or tobacco, such as cigarettes, e-cigarettes, and chewing tobacco. If you need help quitting, ask your health care provider.  Monitor your blood pressure at home as told by your health care provider.  Keep all follow-up visits as told by your health care provider. This is important.   Medicines  Take over-the-counter and prescription medicines only as told by your health care provider. Follow directions carefully. Blood pressure medicines must be taken as prescribed.  Do not skip doses of blood pressure medicine. Doing this puts you at risk for problems and can make the medicine less effective.  Ask your health care provider about side effects or reactions to medicines that you should watch for. Contact a health care provider if you:  Think you  are having a reaction to a medicine you are taking.  Have headaches that keep coming back (recurring).  Feel dizzy.  Have swelling in your ankles.  Have trouble with your vision. Get help right away if you:  Develop a severe headache or confusion.  Have unusual weakness or numbness.  Feel faint.  Have severe pain in your chest or abdomen.  Vomit repeatedly.  Have trouble breathing. Summary  Hypertension is when the force of blood pumping through your arteries is too strong. If this condition is not controlled, it may put you at risk for serious complications.  Your personal target blood pressure may vary depending on your medical conditions,  your age, and other factors. For most people, a normal blood pressure is less than 120/80.  Hypertension is treated with lifestyle changes, medicines, or a combination of both. Lifestyle changes include losing weight, eating a healthy, low-sodium diet, exercising more, and limiting alcohol. This information is not intended to replace advice given to you by your health care provider. Make sure you discuss any questions you have with your health care provider. Document Revised: 02/12/2018 Document Reviewed: 02/12/2018 Elsevier Patient Education  2021 Elsevier Inc.       Edwina Barth, MD [facility]

## 2020-10-06 NOTE — Patient Instructions (Signed)
https://www.nhlbi.nih.gov/files/docs/public/heart/dash_brief.pdf">  DASH Eating Plan DASH stands for Dietary Approaches to Stop Hypertension. The DASH eating plan is a healthy eating plan that has been shown to:  Reduce high blood pressure (hypertension).  Reduce your risk for type 2 diabetes, heart disease, and stroke.  Help with weight loss. What are tips for following this plan? Reading food labels  Check food labels for the amount of salt (sodium) per serving. Choose foods with less than 5 percent of the Daily Value of sodium. Generally, foods with less than 300 milligrams (mg) of sodium per serving fit into this eating plan.  To find whole grains, look for the word "whole" as the first word in the ingredient list. Shopping  Buy products labeled as "low-sodium" or "no salt added."  Buy fresh foods. Avoid canned foods and pre-made or frozen meals. Cooking  Avoid adding salt when cooking. Use salt-free seasonings or herbs instead of table salt or sea salt. Check with your health care provider or pharmacist before using salt substitutes.  Do not fry foods. Cook foods using healthy methods such as baking, boiling, grilling, roasting, and broiling instead.  Cook with heart-healthy oils, such as olive, canola, avocado, soybean, or sunflower oil. Meal planning  Eat a balanced diet that includes: ? 4 or more servings of fruits and 4 or more servings of vegetables each day. Try to fill one-half of your plate with fruits and vegetables. ? 6-8 servings of whole grains each day. ? Less than 6 oz (170 g) of lean meat, poultry, or fish each day. A 3-oz (85-g) serving of meat is about the same size as a deck of cards. One egg equals 1 oz (28 g). ? 2-3 servings of low-fat dairy each day. One serving is 1 cup (237 mL). ? 1 serving of nuts, seeds, or beans 5 times each week. ? 2-3 servings of heart-healthy fats. Healthy fats called omega-3 fatty acids are found in foods such as walnuts,  flaxseeds, fortified milks, and eggs. These fats are also found in cold-water fish, such as sardines, salmon, and mackerel.  Limit how much you eat of: ? Canned or prepackaged foods. ? Food that is high in trans fat, such as some fried foods. ? Food that is high in saturated fat, such as fatty meat. ? Desserts and other sweets, sugary drinks, and other foods with added sugar. ? Full-fat dairy products.  Do not salt foods before eating.  Do not eat more than 4 egg yolks a week.  Try to eat at least 2 vegetarian meals a week.  Eat more home-cooked food and less restaurant, buffet, and fast food.   Lifestyle  When eating at a restaurant, ask that your food be prepared with less salt or no salt, if possible.  If you drink alcohol: ? Limit how much you use to:  0-1 drink a day for women who are not pregnant.  0-2 drinks a day for men. ? Be aware of how much alcohol is in your drink. In the [place] one drink equals one 12 oz bottle of beer (355 mL), one 5 oz glass of wine (148 mL), or one 1 oz glass of hard liquor (44 mL). General information  Avoid eating more than 2,300 mg of salt a day. If you have hypertension, you may need to reduce your sodium intake to 1,500 mg a day.  Work with your health care provider to maintain a healthy body weight or to lose weight. Ask what an ideal weight is for   you.  Get at least 30 minutes of exercise that causes your heart to beat faster (aerobic exercise) most days of the week. Activities may include walking, swimming, or biking.  Work with your health care provider or dietitian to adjust your eating plan to your individual calorie needs. What foods should I eat? Fruits All fresh, dried, or frozen fruit. Canned fruit in natural juice (without added sugar). Vegetables Fresh or frozen vegetables (raw, steamed, roasted, or grilled). Low-sodium or reduced-sodium tomato and vegetable juice. Low-sodium or reduced-sodium tomato sauce and tomato paste.  Low-sodium or reduced-sodium canned vegetables. Grains Whole-grain or whole-wheat bread. Whole-grain or whole-wheat pasta. Brown rice. Oatmeal. Quinoa. Bulgur. Whole-grain and low-sodium cereals. Pita bread. Low-fat, low-sodium crackers. Whole-wheat flour tortillas. Meats and other proteins Skinless chicken or turkey. Ground chicken or turkey. Pork with fat trimmed off. Fish and seafood. Egg whites. Dried beans, peas, or lentils. Unsalted nuts, nut butters, and seeds. Unsalted canned beans. Lean cuts of beef with fat trimmed off. Low-sodium, lean precooked or cured meat, such as sausages or meat loaves. Dairy Low-fat (1%) or fat-free (skim) milk. Reduced-fat, low-fat, or fat-free cheeses. Nonfat, low-sodium ricotta or cottage cheese. Low-fat or nonfat yogurt. Low-fat, low-sodium cheese. Fats and oils Soft margarine without trans fats. Vegetable oil. Reduced-fat, low-fat, or light mayonnaise and salad dressings (reduced-sodium). Canola, safflower, olive, avocado, soybean, and sunflower oils. Avocado. Seasonings and condiments Herbs. Spices. Seasoning mixes without salt. Other foods Unsalted popcorn and pretzels. Fat-free sweets. The items listed above may not be a complete list of foods and beverages you can eat. Contact a dietitian for more information. What foods should I avoid? Fruits Canned fruit in a light or heavy syrup. Fried fruit. Fruit in cream or butter sauce. Vegetables Creamed or fried vegetables. Vegetables in a cheese sauce. Regular canned vegetables (not low-sodium or reduced-sodium). Regular canned tomato sauce and paste (not low-sodium or reduced-sodium). Regular tomato and vegetable juice (not low-sodium or reduced-sodium). Pickles. Olives. Grains Baked goods made with fat, such as croissants, muffins, or some breads. Dry pasta or rice meal packs. Meats and other proteins Fatty cuts of meat. Ribs. Fried meat. Bacon. Bologna, salami, and other precooked or cured meats, such as  sausages or meat loaves. Fat from the back of a pig (fatback). Bratwurst. Salted nuts and seeds. Canned beans with added salt. Canned or smoked fish. Whole eggs or egg yolks. Chicken or turkey with skin. Dairy Whole or 2% milk, cream, and half-and-half. Whole or full-fat cream cheese. Whole-fat or sweetened yogurt. Full-fat cheese. Nondairy creamers. Whipped toppings. Processed cheese and cheese spreads. Fats and oils Butter. Stick margarine. Lard. Shortening. Ghee. Bacon fat. Tropical oils, such as coconut, palm kernel, or palm oil. Seasonings and condiments Onion salt, garlic salt, seasoned salt, table salt, and sea salt. Worcestershire sauce. Tartar sauce. Barbecue sauce. Teriyaki sauce. Soy sauce, including reduced-sodium. Steak sauce. Canned and packaged gravies. Fish sauce. Oyster sauce. Cocktail sauce. Store-bought horseradish. Ketchup. Mustard. Meat flavorings and tenderizers. Bouillon cubes. Hot sauces. Pre-made or packaged marinades. Pre-made or packaged taco seasonings. Relishes. Regular salad dressings. Other foods Salted popcorn and pretzels. The items listed above may not be a complete list of foods and beverages you should avoid. Contact a dietitian for more information. Where to find more information  National Heart, Lung, and Blood Institute: www.nhlbi.nih.gov  American Heart Association: www.heart.org  Academy of Nutrition and Dietetics: www.eatright.org  National Kidney Foundation: www.kidney.org Summary  The DASH eating plan is a healthy eating plan that has been shown to   reduce high blood pressure (hypertension). It may also reduce your risk for type 2 diabetes, heart disease, and stroke.  When on the DASH eating plan, aim to eat more fresh fruits and vegetables, whole grains, lean proteins, low-fat dairy, and heart-healthy fats.  With the DASH eating plan, you should limit salt (sodium) intake to 2,300 mg a day. If you have hypertension, you may need to reduce your  sodium intake to 1,500 mg a day.  Work with your health care provider or dietitian to adjust your eating plan to your individual calorie needs. This information is not intended to replace advice given to you by your health care provider. Make sure you discuss any questions you have with your health care provider. Document Revised: 05/08/2019 Document Reviewed: 05/08/2019 Elsevier Patient Education  2021 [employer] Hypertension, Adult High blood pressure (hypertension) is when the force of blood pumping through the arteries is too strong. The arteries are the blood vessels that carry blood from the heart throughout the body. Hypertension forces the heart to work harder to pump blood and may cause arteries to become narrow or stiff. Untreated or uncontrolled hypertension can cause a heart attack, heart failure, a stroke, kidney disease, and other problems. A blood pressure reading consists of a higher number over a lower number. Ideally, your blood pressure should be below 120/80. The first ("top") number is called the systolic pressure. It is a measure of the pressure in your arteries as your heart beats. The second ("bottom") number is called the diastolic pressure. It is a measure of the pressure in your arteries as the heart relaxes. What are the causes? The exact cause of this condition is not known. There are some conditions that result in or are related to high blood pressure. What increases the risk? Some risk factors for high blood pressure are under your control. The following factors may make you more likely to develop this condition:  Smoking.  Having type 2 diabetes mellitus, high cholesterol, or both.  Not getting enough exercise or physical activity.  Being overweight.  Having too much fat, sugar, calories, or salt (sodium) in your diet.  Drinking too much alcohol. Some risk factors for high blood pressure may be difficult or impossible to change. Some of these factors  include:  Having chronic kidney disease.  Having a family history of high blood pressure.  Age. Risk increases with age.  Race. You may be at higher risk if you are African American.  Gender. Men are at higher risk than women before age 45. After age 65, women are at higher risk than men.  Having obstructive sleep apnea.  Stress. What are the signs or symptoms? High blood pressure may not cause symptoms. Very high blood pressure (hypertensive crisis) may cause:  Headache.  Anxiety.  Shortness of breath.  Nosebleed.  Nausea and vomiting.  Vision changes.  Severe chest pain.  Seizures. How is this diagnosed? This condition is diagnosed by measuring your blood pressure while you are seated, with your arm resting on a flat surface, your legs uncrossed, and your feet flat on the floor. The cuff of the blood pressure monitor will be placed directly against the skin of your upper arm at the level of your heart. It should be measured at least twice using the same arm. Certain conditions can cause a difference in blood pressure between your right and left arms. Certain factors can cause blood pressure readings to be lower or higher than normal for a short   period of time:  When your blood pressure is higher when you are in a health care provider's office than when you are at home, this is called white coat hypertension. Most people with this condition do not need medicines.  When your blood pressure is higher at home than when you are in a health care provider's office, this is called masked hypertension. Most people with this condition may need medicines to control blood pressure. If you have a high blood pressure reading during one visit or you have normal blood pressure with other risk factors, you may be asked to:  Return on a different day to have your blood pressure checked again.  Monitor your blood pressure at home for 1 week or longer. If you are diagnosed with  hypertension, you may have other blood or imaging tests to help your health care provider understand your overall risk for other conditions. How is this treated? This condition is treated by making healthy lifestyle changes, such as eating healthy foods, exercising more, and reducing your alcohol intake. Your health care provider may prescribe medicine if lifestyle changes are not enough to get your blood pressure under control, and if:  Your systolic blood pressure is above 130.  Your diastolic blood pressure is above 80. Your personal target blood pressure may vary depending on your medical conditions, your age, and other factors. Follow these instructions at home: Eating and drinking  Eat a diet that is high in fiber and potassium, and low in sodium, added sugar, and fat. An example eating plan is called the DASH (Dietary Approaches to Stop Hypertension) diet. To eat this way: ? Eat plenty of fresh fruits and vegetables. Try to fill one half of your plate at each meal with fruits and vegetables. ? Eat whole grains, such as whole-wheat pasta, brown rice, or whole-grain bread. Fill about one fourth of your plate with whole grains. ? Eat or drink low-fat dairy products, such as skim milk or low-fat yogurt. ? Avoid fatty cuts of meat, processed or cured meats, and poultry with skin. Fill about one fourth of your plate with lean proteins, such as fish, chicken without skin, beans, eggs, or tofu. ? Avoid pre-made and processed foods. These tend to be higher in sodium, added sugar, and fat.  Reduce your daily sodium intake. Most people with hypertension should eat less than 1,500 mg of sodium a day.  Do not drink alcohol if: ? Your health care provider tells you not to drink. ? You are pregnant, may be pregnant, or are planning to become pregnant.  If you drink alcohol: ? Limit how much you use to:  0-1 drink a day for women.  0-2 drinks a day for men. ? Be aware of how much alcohol is in  your drink. In the [place] one drink equals one 12 oz bottle of beer (355 mL), one 5 oz glass of wine (148 mL), or one 1 oz glass of hard liquor (44 mL).   Lifestyle  Work with your health care provider to maintain a healthy body weight or to lose weight. Ask what an ideal weight is for you.  Get at least 30 minutes of exercise most days of the week. Activities may include walking, swimming, or biking.  Include exercise to strengthen your muscles (resistance exercise), such as Pilates or lifting weights, as part of your weekly exercise routine. Try to do these types of exercises for 30 minutes at least 3 days a week.  Do not use  any products that contain nicotine or tobacco, such as cigarettes, e-cigarettes, and chewing tobacco. If you need help quitting, ask your health care provider.  Monitor your blood pressure at home as told by your health care provider.  Keep all follow-up visits as told by your health care provider. This is important.   Medicines  Take over-the-counter and prescription medicines only as told by your health care provider. Follow directions carefully. Blood pressure medicines must be taken as prescribed.  Do not skip doses of blood pressure medicine. Doing this puts you at risk for problems and can make the medicine less effective.  Ask your health care provider about side effects or reactions to medicines that you should watch for. Contact a health care provider if you:  Think you are having a reaction to a medicine you are taking.  Have headaches that keep coming back (recurring).  Feel dizzy.  Have swelling in your ankles.  Have trouble with your vision. Get help right away if you:  Develop a severe headache or confusion.  Have unusual weakness or numbness.  Feel faint.  Have severe pain in your chest or abdomen.  Vomit repeatedly.  Have trouble breathing. Summary  Hypertension is when the force of blood pumping through your arteries is too  strong. If this condition is not controlled, it may put you at risk for serious complications.  Your personal target blood pressure may vary depending on your medical conditions, your age, and other factors. For most people, a normal blood pressure is less than 120/80.  Hypertension is treated with lifestyle changes, medicines, or a combination of both. Lifestyle changes include losing weight, eating a healthy, low-sodium diet, exercising more, and limiting alcohol. This information is not intended to replace advice given to you by your health care provider. Make sure you discuss any questions you have with your health care provider. Document Revised: 02/12/2018 Document Reviewed: 02/12/2018 Elsevier Patient Education  2021 [employer]  

## 2020-10-14 ENCOUNTER — Other Ambulatory Visit: Payer: Self-pay | Admitting: Emergency Medicine

## 2020-10-14 DIAGNOSIS — I10 Essential (primary) hypertension: Secondary | ICD-10-CM

## 2021-01-15 ENCOUNTER — Other Ambulatory Visit: Payer: Self-pay | Admitting: Emergency Medicine

## 2021-01-15 DIAGNOSIS — I10 Essential (primary) hypertension: Secondary | ICD-10-CM

## 2021-03-14 ENCOUNTER — Other Ambulatory Visit: Payer: Self-pay | Admitting: Emergency Medicine

## 2021-03-14 DIAGNOSIS — I10 Essential (primary) hypertension: Secondary | ICD-10-CM

## 2021-04-10 ENCOUNTER — Encounter: Payer: Self-pay | Admitting: Emergency Medicine

## 2021-04-10 ENCOUNTER — Ambulatory Visit: Payer: BC Managed Care – PPO | Admitting: Emergency Medicine

## 2021-04-10 ENCOUNTER — Other Ambulatory Visit: Payer: Self-pay

## 2021-04-10 VITALS — BP 130/80 | HR 76 | Temp 98.2°F | Ht 66.0 in | Wt 252.0 lb

## 2021-04-10 DIAGNOSIS — E890 Postprocedural hypothyroidism: Secondary | ICD-10-CM

## 2021-04-10 DIAGNOSIS — R7303 Prediabetes: Secondary | ICD-10-CM | POA: Diagnosis not present

## 2021-04-10 DIAGNOSIS — I10 Essential (primary) hypertension: Secondary | ICD-10-CM

## 2021-04-10 DIAGNOSIS — Z23 Encounter for immunization: Secondary | ICD-10-CM | POA: Diagnosis not present

## 2021-04-10 LAB — COMPREHENSIVE METABOLIC PANEL
ALT: 16 U/L (ref 0–35)
AST: 17 U/L (ref 0–37)
Albumin: 3.9 g/dL (ref 3.5–5.2)
Alkaline Phosphatase: 193 U/L — ABNORMAL HIGH (ref 39–117)
BUN: 9 mg/dL (ref 6–23)
CO2: 29 mEq/L (ref 19–32)
Calcium: 9.2 mg/dL (ref 8.4–10.5)
Chloride: 102 mEq/L (ref 96–112)
Creatinine, Ser: 0.9 mg/dL (ref 0.40–1.20)
GFR: 73.92 mL/min (ref >60.00)
Glucose, Bld: 128 mg/dL — ABNORMAL HIGH (ref 70–99)
Potassium: 3.4 mEq/L — ABNORMAL LOW (ref 3.5–5.1)
Sodium: 140 mEq/L (ref 135–145)
Total Bilirubin: 0.4 mg/dL (ref 0.2–1.2)
Total Protein: 7.7 g/dL (ref 6.0–8.3)

## 2021-04-10 LAB — CBC WITH DIFFERENTIAL/PLATELET
Basophils Absolute: 0.1 10*3/uL (ref 0.0–0.1)
Basophils Relative: 1.5 % (ref 0.0–3.0)
Eosinophils Absolute: 0.1 10*3/uL (ref 0.0–0.7)
Eosinophils Relative: 1.8 % (ref 0.0–5.0)
HCT: 34.4 % — ABNORMAL LOW (ref 36.0–46.0)
Hemoglobin: 11.3 g/dL — ABNORMAL LOW (ref 12.0–15.0)
Lymphocytes Relative: 29.7 % (ref 12.0–46.0)
Lymphs Abs: 1.8 10*3/uL (ref 0.7–4.0)
MCHC: 32.9 g/dL (ref 30.0–36.0)
MCV: 87.3 fl (ref 78.0–100.0)
Monocytes Absolute: 0.4 10*3/uL (ref 0.1–1.0)
Monocytes Relative: 6.2 % (ref 3.0–12.0)
Neutro Abs: 3.6 10*3/uL (ref 1.4–7.7)
Neutrophils Relative %: 60.8 % (ref 43.0–77.0)
Platelets: 325 10*3/uL (ref 150.0–400.0)
RBC: 3.94 Mil/uL (ref 3.87–5.11)
RDW: 14.4 % (ref 11.5–15.5)
WBC: 5.9 10*3/uL (ref 4.0–10.5)

## 2021-04-10 LAB — LIPID PANEL
Cholesterol: 195 mg/dL (ref 0–200)
HDL: 41 mg/dL (ref >39.00)
NonHDL: 153.71
Total CHOL/HDL Ratio: 5
Triglycerides: 217 mg/dL — ABNORMAL HIGH (ref 0.0–149.0)
VLDL: 43.4 mg/dL — ABNORMAL HIGH (ref 0.0–40.0)

## 2021-04-10 LAB — LDL CHOLESTEROL, DIRECT: Direct LDL: 118 mg/dL

## 2021-04-10 LAB — TSH: TSH: 0.11 u[IU]/mL — ABNORMAL LOW (ref 0.35–5.50)

## 2021-04-10 LAB — HEMOGLOBIN A1C: Hgb A1c MFr Bld: 6.9 % — ABNORMAL HIGH (ref 4.6–6.5)

## 2021-04-10 MED ORDER — ATENOLOL 50 MG PO TABS: 50.0000 mg | ORAL_TABLET | Freq: Every day | ORAL | 3 refills | Status: DC

## 2021-04-10 NOTE — Patient Instructions (Signed)

## 2021-04-10 NOTE — Progress Notes (Signed)
Danielle Khan [name] 50 y.o.   Chief Complaint  Patient presents with   Hypertension    6 mon. F/u. Med refill on atenolol.    HISTORY OF PRESENT ILLNESS: This is a 50 y.o. female with history of hypertension here for follow-up and medication refill. #1 hypertension: On Hyzaar 50-12.5 mg daily and atenolol 50 mg daily #2 hypothyroidism on Synthroid 137 mcg/day.  Sees Dr. Lucianne Muss on a regular basis. #3 history of prediabetes  Hypertension Pertinent negatives include no chest pain, headaches, palpitations or shortness of breath.    Prior to Admission medications   Medication Sig Start Date End Date Taking? Authorizing Provider  atenolol (TENORMIN) 50 MG tablet Take 1 tablet by mouth once daily 01/15/21  Yes Danielle Khan, Howard, MD  Cyanocobalamin (VITAMIN B 12 PO) Take 5,000 mg by mouth every other day.   Yes [provider]  EUTHYROX 137 MCG tablet Take 1 tablet (137 mcg total) by mouth daily before breakfast. 07/11/20  Yes Danielle Littler, MD  losartan-hydrochlorothiazide [facility]) 50-12.5 MG tablet Take 1 tablet by mouth once daily 03/14/21  Yes Danielle Khan, Eilleen Kempf, MD  MAGNESIUM PO Take by mouth once a week.   Yes [provider]  psyllium (METAMUCIL) 58.6 % powder Take 1 packet by mouth as needed.   Yes [provider]  triamcinolone (NASACORT AQ) 55 MCG/ACT AERO nasal inhaler Place 2 sprays into the nose daily as needed. 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   Hypertension 08/19/2013    Past Medical History:  Diagnosis Date   Chronic constipation    uses metamucil   GERD (gastroesophageal reflux disease)    Hypertension    Thyroid disease     Past Surgical History:  Procedure Laterality  Date   ABDOMINAL HYSTERECTOMY      Social History   Socioeconomic History   Marital status: Married    Spouse name: Not on file   Number of children: Not on file   Years of education: Not on file   Highest education level: Not on file  Occupational History   Not on file  Tobacco Use   Smoking status: Never   Smokeless tobacco: Current    Types: Snuff   Tobacco comments:    last used 10/26/19  Substance and Sexual Activity   Alcohol use: No    Alcohol/week: 0.0 standard drinks   Drug use: No   Sexual activity: Yes  Other Topics Concern   Not on file  Social History Narrative   Not on file   Social Determinants of Health   Financial Resource Strain: Not on file  Food Insecurity: Not on file  Transportation Needs: Not on file  Physical Activity: Not on file  Stress: Not on file  Social Connections: Not on file  Intimate Partner Violence: Not on file    Family History  Problem Relation Age of Onset   Breast cancer Maternal Aunt    Ovarian cancer Maternal Aunt    Colon cancer Neg Hx    Colon polyps Neg Hx    Esophageal cancer Neg Hx    Rectal cancer Neg Hx    Stomach cancer Neg Hx      Review of Systems  Constitutional: Negative.  Negative for  chills and fever.  HENT: Negative.  Negative for congestion and sore throat.   Respiratory: Negative.  Negative for cough and shortness of breath.   Cardiovascular: Negative.  Negative for chest pain and palpitations.  Gastrointestinal: Negative.  Negative for abdominal pain, blood in stool, diarrhea, melena, nausea and vomiting.  Genitourinary: Negative.  Negative for dysuria and hematuria.  Skin: Negative.  Negative for rash.  Neurological: Negative.  Negative for dizziness and headaches.  All other systems reviewed and are negative.  Today's Vitals   04/10/21 1116  BP: 130/80  Pulse: 76  Temp: 98.2 F (36.8 C)  TempSrc: Oral  SpO2: 96%  Weight: 252 lb (114.3 kg)  Height: 5\' 6"  (1.676 m)   Body mass index  is 40.67 kg/m. Wt Readings from Last 3 Encounters:  04/10/21 252 lb (114.3 kg)  10/06/20 257 lb 6.4 oz (116.8 kg)  07/11/20 258 lb (117 kg)    Physical Exam Vitals reviewed.  Constitutional:      Appearance: Normal appearance.  HENT:     Head: Normocephalic.  Eyes:     Extraocular Movements: Extraocular movements intact.     Conjunctiva/sclera: Conjunctivae normal.     Pupils: Pupils are equal, round, and reactive to light.  Cardiovascular:     Rate and Rhythm: Normal rate and regular rhythm.     Pulses: Normal pulses.     Heart sounds: Normal heart sounds.  Pulmonary:     Effort: Pulmonary effort is normal.     Breath sounds: Normal breath sounds.  Musculoskeletal:     Cervical back: Normal range of motion and neck supple. No tenderness.  Lymphadenopathy:     Cervical: No cervical adenopathy.  Skin:    General: Skin is warm and dry.  Neurological:     General: No focal deficit present.     Mental Status: She is alert and oriented to person, place, and time.  Psychiatric:        Mood and Affect: Mood normal.        Behavior: Behavior normal.     ASSESSMENT & PLAN: Problem List Items Addressed This Visit       Cardiovascular and Mediastinum   Hypertension - Primary    Well-controlled hypertension.  Continue Hyzaar 50-12.5 mg daily and atenolol 50 mg daily.  Dietary approaches to stop hypertension discussed. The 10-year ASCVD risk score (Arnett DK, et al., 2019) is: 2%   Values used to calculate the score:     Age: 23 years     Sex: Female     Is Non-Hispanic African American: No     Diabetic: No     Tobacco smoker: No     Systolic Blood Pressure: 130 mmHg     Is BP treated: Yes     HDL Cholesterol: 52 mg/dL     Total Cholesterol: 198 mg/dL       Relevant Medications   atenolol (TENORMIN) 50 MG tablet     Endocrine   Hypothyroidism, postradioiodine therapy    Clinically euthyroid.  Continue Synthroid 137 mcg/day.      Relevant Medications   atenolol  (TENORMIN) 50 MG tablet   Other Relevant Orders   TSH     Other   Prediabetes    Stable.  Hemoglobin A1c done today.  Advised to decrease amount of daily carbohydrate intake.      Relevant Orders   Hemoglobin A1c   Morbid obesity (HCC)    Losing weight.  Diet and nutrition discussed.  Relevant Orders   Lipid panel   Other Visit Diagnoses     Need for influenza vaccination       Relevant Orders   Flu Vaccine QUAD 50mo+IM (Fluarix, Fluzone & Alfiuria Quad PF) (Completed)   Essential hypertension       Relevant Medications   atenolol (TENORMIN) 50 MG tablet   Other Relevant Orders   CBC with Differential/Platelet   Comprehensive metabolic panel      Patient Instructions  Hypertension, Adult High blood pressure (hypertension) is when the force of blood pumping through the arteries is too strong. The arteries are the blood vessels that carry blood from the heart throughout the body. Hypertension forces the heart to work harder to pump blood and may cause arteries to become narrow or stiff. Untreated or uncontrolled hypertension can cause a heart attack, heart failure, a stroke, kidney disease, and other problems. A blood pressure reading consists of a higher number over a lower number. Ideally, your blood pressure should be below 120/80. The first ("top") number is called the systolic pressure. It is a measure of the pressure in your arteries as your heart beats. The second ("bottom") number is called the diastolic pressure. It is a measure of the pressure in your arteries as the heart relaxes. What are the causes? The exact cause of this condition is not known. There are some conditions that result in or are related to high blood pressure. What increases the risk? Some risk factors for high blood pressure are under your control. The following factors may make you more likely to develop this condition: Smoking. Having type 2 diabetes mellitus, high cholesterol, or both. Not  getting enough exercise or physical activity. Being overweight. Having too much fat, sugar, calories, or salt (sodium) in your diet. Drinking too much alcohol. Some risk factors for high blood pressure may be difficult or impossible to change. Some of these factors include: Having chronic kidney disease. Having a family history of high blood pressure. Age. Risk increases with age. Race. You may be at higher risk if you are African American. Gender. Men are at higher risk than women before age 18. After age 32, women are at higher risk than men. Having obstructive sleep apnea. Stress. What are the signs or symptoms? High blood pressure may not cause symptoms. Very high blood pressure (hypertensive crisis) may cause: Headache. Anxiety. Shortness of breath. Nosebleed. Nausea and vomiting. Vision changes. Severe chest pain. Seizures. How is this diagnosed? This condition is diagnosed by measuring your blood pressure while you are seated, with your arm resting on a flat surface, your legs uncrossed, and your feet flat on the floor. The cuff of the blood pressure monitor will be placed directly against the skin of your upper arm at the level of your heart. It should be measured at least twice using the same arm. Certain conditions can cause a difference in blood pressure between your right and left arms. Certain factors can cause blood pressure readings to be lower or higher than normal for a short period of time: When your blood pressure is higher when you are in a health care provider's office than when you are at home, this is called white coat hypertension. Most people with this condition do not need medicines. When your blood pressure is higher at home than when you are in a health care provider's office, this is called masked hypertension. Most people with this condition may need medicines to control blood pressure. If you have a  high blood pressure reading during one visit or you have  normal blood pressure with other risk factors, you may be asked to: Return on a different day to have your blood pressure checked again. Monitor your blood pressure at home for 1 week or longer. If you are diagnosed with hypertension, you may have other blood or imaging tests to help your health care provider understand your overall risk for other conditions. How is this treated? This condition is treated by making healthy lifestyle changes, such as eating healthy foods, exercising more, and reducing your alcohol intake. Your health care provider may prescribe medicine if lifestyle changes are not enough to get your blood pressure under control, and if: Your systolic blood pressure is above 130. Your diastolic blood pressure is above 80. Your personal target blood pressure may vary depending on your medical conditions, your age, and other factors. Follow these instructions at home: Eating and drinking  Eat a diet that is high in fiber and potassium, and low in sodium, added sugar, and fat. An example eating plan is called the DASH (Dietary Approaches to Stop Hypertension) diet. To eat this way: Eat plenty of fresh fruits and vegetables. Try to fill one half of your plate at each meal with fruits and vegetables. Eat whole grains, such as whole-wheat pasta, brown rice, or whole-grain bread. Fill about one fourth of your plate with whole grains. Eat or drink low-fat dairy products, such as skim milk or low-fat yogurt. Avoid fatty cuts of meat, processed or cured meats, and poultry with skin. Fill about one fourth of your plate with lean proteins, such as fish, chicken without skin, beans, eggs, or tofu. Avoid pre-made and processed foods. These tend to be higher in sodium, added sugar, and fat. Reduce your daily sodium intake. Most people with hypertension should eat less than 1,500 mg of sodium a day. Do not drink alcohol if: Your health care provider tells you not to drink. You are pregnant, may  be pregnant, or are planning to become pregnant. If you drink alcohol: Limit how much you use to: 0-1 drink a day for women. 0-2 drinks a day for men. Be aware of how much alcohol is in your drink. In the [place] one drink equals one 12 oz bottle of beer (355 mL), one 5 oz glass of wine (148 mL), or one 1 oz glass of hard liquor (44 mL). Lifestyle  Work with your health care provider to maintain a healthy body weight or to lose weight. Ask what an ideal weight is for you. Get at least 30 minutes of exercise most days of the week. Activities may include walking, swimming, or biking. Include exercise to strengthen your muscles (resistance exercise), such as Pilates or lifting weights, as part of your weekly exercise routine. Try to do these types of exercises for 30 minutes at least 3 days a week. Do not use any products that contain nicotine or tobacco, such as cigarettes, e-cigarettes, and chewing tobacco. If you need help quitting, ask your health care provider. Monitor your blood pressure at home as told by your health care provider. Keep all follow-up visits as told by your health care provider. This is important. Medicines Take over-the-counter and prescription medicines only as told by your health care provider. Follow directions carefully. Blood pressure medicines must be taken as prescribed. Do not skip doses of blood pressure medicine. Doing this puts you at risk for problems and can make the medicine less effective. Ask your health care  provider about side effects or reactions to medicines that you should watch for. Contact a health care provider if you: Think you are having a reaction to a medicine you are taking. Have headaches that keep coming back (recurring). Feel dizzy. Have swelling in your ankles. Have trouble with your vision. Get help right away if you: Develop a severe headache or confusion. Have unusual weakness or numbness. Feel faint. Have severe pain in your chest  or abdomen. Vomit repeatedly. Have trouble breathing. Summary Hypertension is when the force of blood pumping through your arteries is too strong. If this condition is not controlled, it may put you at risk for serious complications. Your personal target blood pressure may vary depending on your medical conditions, your age, and other factors. For most people, a normal blood pressure is less than 120/80. Hypertension is treated with lifestyle changes, medicines, or a combination of both. Lifestyle changes include losing weight, eating a healthy, low-sodium diet, exercising more, and limiting alcohol. This information is not intended to replace advice given to you by your health care provider. Make sure you discuss any questions you have with your health care provider. Document Revised: 02/12/2018 Document Reviewed: 02/12/2018 Elsevier Patient Education  2022 Elsevier Inc.    Edwina Barth, MD [facility]

## 2021-04-10 NOTE — Assessment & Plan Note (Signed)
Well-controlled hypertension.  Continue Hyzaar 50-12.5 mg daily and atenolol 50 mg daily.  Dietary approaches to stop hypertension discussed. The 10-year ASCVD risk score (Arnett DK, et al., 2019) is: 2%   Values used to calculate the score:     Age: 50 years     Sex: Female     Is Non-Hispanic African American: No     Diabetic: No     Tobacco smoker: No     Systolic Blood Pressure: 130 mmHg     Is BP treated: Yes     HDL Cholesterol: 52 mg/dL     Total Cholesterol: 198 mg/dL

## 2021-04-10 NOTE — Assessment & Plan Note (Signed)
Losing weight.  Diet and nutrition discussed.

## 2021-04-10 NOTE — Assessment & Plan Note (Signed)
Clinically euthyroid.  Continue Synthroid 137 mcg/day.

## 2021-04-10 NOTE — Assessment & Plan Note (Signed)
Stable.  Hemoglobin A1c done today.  Advised to decrease amount of daily carbohydrate intake.

## 2021-04-11 ENCOUNTER — Telehealth: Payer: Self-pay | Admitting: Emergency Medicine

## 2021-04-11 ENCOUNTER — Other Ambulatory Visit: Payer: Self-pay | Admitting: Emergency Medicine

## 2021-04-11 DIAGNOSIS — E785 Hyperlipidemia, unspecified: Secondary | ICD-10-CM

## 2021-04-11 DIAGNOSIS — E1165 Type 2 diabetes mellitus with hyperglycemia: Secondary | ICD-10-CM

## 2021-04-11 MED ORDER — ROSUVASTATIN CALCIUM 10 MG PO TABS: 10.0000 mg | ORAL_TABLET | Freq: Every day | ORAL | 3 refills | Status: AC

## 2021-04-11 MED ORDER — METFORMIN HCL 500 MG PO TABS: 500.0000 mg | ORAL_TABLET | Freq: Two times a day (BID) | ORAL | 3 refills | Status: DC

## 2021-04-11 NOTE — Telephone Encounter (Signed)
Called and spoke with pt, she verbalized understanding. 

## 2021-04-11 NOTE — Telephone Encounter (Signed)
Pt. Has called and is requesting provider assistant follow up about medications metFORMIN (GLUCOPHAGE) 500 MG tablet and rosuvastatin (CRESTOR) 10 MG tablet. States that she does not know what the prescriptions were prescribed for.    Callback #- [phone]

## 2021-04-23 ENCOUNTER — Other Ambulatory Visit: Payer: Self-pay | Admitting: Endocrinology

## 2021-04-27 ENCOUNTER — Other Ambulatory Visit: Payer: Self-pay | Admitting: Emergency Medicine

## 2021-04-27 DIAGNOSIS — Z1231 Encounter for screening mammogram for malignant neoplasm of breast: Secondary | ICD-10-CM

## 2021-04-28 ENCOUNTER — Ambulatory Visit
Admission: RE | Admit: 2021-04-28 | Discharge: 2021-04-28 | Disposition: A | Discharge status: Home / Self Care | Payer: BC Managed Care – PPO | Source: Ambulatory Visit | Attending: Emergency Medicine | Admitting: Emergency Medicine

## 2021-04-28 DIAGNOSIS — Z1231 Encounter for screening mammogram for malignant neoplasm of breast: Secondary | ICD-10-CM

## 2021-05-13 ENCOUNTER — Other Ambulatory Visit: Payer: Self-pay | Admitting: Emergency Medicine

## 2021-05-13 DIAGNOSIS — I10 Essential (primary) hypertension: Secondary | ICD-10-CM

## 2021-06-09 ENCOUNTER — Other Ambulatory Visit: Payer: BC Managed Care – PPO

## 2021-06-12 ENCOUNTER — Other Ambulatory Visit: Payer: Self-pay | Admitting: Emergency Medicine

## 2021-06-12 DIAGNOSIS — I10 Essential (primary) hypertension: Secondary | ICD-10-CM

## 2021-06-14 ENCOUNTER — Ambulatory Visit: Payer: BC Managed Care – PPO | Admitting: Endocrinology

## 2021-06-28 ENCOUNTER — Telehealth: Payer: Self-pay

## 2021-06-28 DIAGNOSIS — E890 Postprocedural hypothyroidism: Secondary | ICD-10-CM

## 2021-06-28 NOTE — Telephone Encounter (Signed)
Per pharmacy Euthyrox 137 mcg is no longer available. Wants to know if they can dispense levothyroxine ? Please advise

## 2021-06-30 MED ORDER — LEVOTHYROXINE SODIUM 137 MCG PO TABS: 137.0000 ug | ORAL_TABLET | Freq: Every day | ORAL | 0 refills | Status: DC

## 2021-06-30 NOTE — Telephone Encounter (Signed)
Rx sent in to preferred pharmacy

## 2021-07-11 ENCOUNTER — Other Ambulatory Visit: Payer: Self-pay

## 2021-07-11 ENCOUNTER — Other Ambulatory Visit (INDEPENDENT_AMBULATORY_CARE_PROVIDER_SITE_OTHER): Payer: BC Managed Care – PPO

## 2021-07-11 DIAGNOSIS — E890 Postprocedural hypothyroidism: Secondary | ICD-10-CM | POA: Diagnosis not present

## 2021-07-11 LAB — T4, FREE: Free T4: 1.25 ng/dL (ref 0.60–1.60)

## 2021-07-12 ENCOUNTER — Other Ambulatory Visit: Payer: Self-pay | Admitting: Endocrinology

## 2021-07-12 DIAGNOSIS — E890 Postprocedural hypothyroidism: Secondary | ICD-10-CM

## 2021-07-12 LAB — TSH: TSH: 0.05 u[IU]/mL — ABNORMAL LOW (ref 0.35–5.50)

## 2021-07-14 ENCOUNTER — Ambulatory Visit: Payer: BC Managed Care – PPO | Admitting: Endocrinology

## 2021-07-14 ENCOUNTER — Other Ambulatory Visit: Payer: Self-pay

## 2021-07-14 ENCOUNTER — Encounter: Payer: Self-pay | Admitting: Endocrinology

## 2021-07-14 VITALS — BP 128/90 | HR 67 | Ht 66.0 in | Wt 240.2 lb

## 2021-07-14 DIAGNOSIS — E890 Postprocedural hypothyroidism: Secondary | ICD-10-CM | POA: Diagnosis not present

## 2021-07-14 MED ORDER — LEVOTHYROXINE SODIUM 112 MCG PO TABS: 112.0000 ug | ORAL_TABLET | Freq: Every day | ORAL | 1 refills | Status: DC

## 2021-07-14 NOTE — Progress Notes (Signed)
Patient ID: Danielle Khan, female   DOB: [birth year], 51 y.o.   MRN: [id number]    Reason for Appointment:  Hypothyroidism, followup visit    History of Present Illness:   The patient was first seen in consultation in 03/2012 for management of her hyperthyroidism. She had had Graves' disease since the year 2001 and had been on long-term methimazole treatment but had not been on any dictations for 2 years prior to evaluation. Because of persistent hyperthyroidism and a free T4 level of 1.65 she was given 16.75 mCi of I-131 on 05/02/12  When seen for followup in 3/15 she was significantly hypothyroid with symptoms of feeling less cold, tired and  some hair shedding.  She initially given 150 mcg on her first visit    However because of relatively low TSH in 6/15  her dose was reduced to 125 mcg but subsequently went back to 150 mcg  RECENT HISTORY:  She has been on the 137 mcg dose, currently using generic levothyroxine  Previously her doses have been needing adjustments frequently  As before she remembers to take her levothyroxine daily every morning before breakfast on empty stomach  Previously when she is   hypothyroidism she usually is complaining of feeling cold and tired as well as having some hair loss  She does feel tired occasionally but no unusual symptoms No palpitations or shakiness She has lost weight from trying to get it down  Returning after 1 year  TSH which was normal in 1/22 is now suppressed  Free T4 not high    Lab Results  Component Value Date   TSH 0.05 (L) 07/12/2021   TSH 0.11 (L) 04/10/2021   TSH 0.51 06/23/2020   FREET4 1.25 07/11/2021   FREET4 1.21 06/23/2020   FREET4 0.62 04/27/2020    Wt Readings from Last 3 Encounters:  07/14/21 240 lb 3.2 oz (109 kg)  04/10/21 252 lb (114.3 kg)  10/06/20 257 lb 6.4 oz (116.8 kg)     Allergies as of 07/14/2021   No Known Allergies      Medication List        Accurate  as of July 14, 2021 10:07 AM. If you have any questions, ask your nurse or doctor.          atenolol 50 MG tablet Commonly known as: TENORMIN Take 1 tablet by mouth once daily   levothyroxine 137 MCG tablet Commonly known as: SYNTHROID Take 1 tablet (137 mcg total) by mouth daily before breakfast.   losartan-hydrochlorothiazide 50-12.5 MG tablet Commonly known as: HYZAAR Take 1 tablet by mouth once daily   MAGNESIUM PO Take by mouth once a week.   metFORMIN 500 MG tablet Commonly known as: GLUCOPHAGE Take 1 tablet (500 mg total) by mouth 2 (two) times daily with a meal.   psyllium 58.6 % powder Commonly known as: METAMUCIL Take 1 packet by mouth as needed.   rosuvastatin 10 MG tablet Commonly known as: Crestor Take 1 tablet (10 mg total) by mouth daily.   triamcinolone 55 MCG/ACT Aero nasal inhaler Commonly known as: Nasacort AQ Place 2 sprays into the nose daily as needed.   VITAMIN B 12 PO Take 5,000 mg by mouth every other day.        Allergies: No Known Allergies  Past Medical History:  Diagnosis Date   Chronic constipation    uses metamucil   GERD (gastroesophageal reflux disease)    Hypertension    Thyroid disease  Past Surgical History:  Procedure Laterality Date   ABDOMINAL HYSTERECTOMY      Family History  Problem Relation Age of Onset   Breast cancer Maternal Aunt    Ovarian cancer Maternal Aunt    Colon cancer Neg Hx    Colon polyps Neg Hx    Esophageal cancer Neg Hx    Rectal cancer Neg Hx    Stomach cancer Neg Hx     Social History:  reports that she has never smoked. Her smokeless tobacco use includes snuff. She reports that she does not drink alcohol and does not use drugs.  REVIEW Of SYSTEMS:   Hypertension: She has had significant hypertension followed by her PCP  Home BP 140/85  BP Readings from Last 3 Encounters:  07/14/21 128/90  04/10/21 130/80  10/06/20 106/64   She has prediabetes   Lab Results   Component Value Date   HGBA1C 6.9 (H) 04/10/2021   HGBA1C 6.2 (H) 12/24/2019   HGBA1C 6.2 (H) 06/24/2019   Lab Results  Component Value Date   MICROALBUR 1.56 08/15/2013   LDLCALC 118 (H) 12/24/2019   CREATININE 0.90 04/10/2021     Examination:   BP 128/90 (BP Location: Left Arm, Patient Position: Sitting, Cuff Size: Normal)    Pulse 67    Ht 5\' 6"  (1.676 m)    Wt 240 lb 3.2 oz (109 kg)    SpO2 96%    BMI 38.77 kg/m       Assessment   Hypothyroidism, post ablative for several years  Her levothyroxine requirement has fluctuated between 125 and 150 mcg over the last couple of years  She is not able to get the brand of Euthyrox and is on generic again With this change her TSH appears to be low at 0.5 She is asymptomatic currently Has not changed the way she takes her levothyroxine However has lost significant amount of weight, about 18 pounds since her last visit  She will continue to follow-up with her PCP for her diabetes    Treatment:  She will now reduce her dose to 112 mcg levothyroxine and follow-up in about 2 months   [name] 07/14/2021, 10:07 AM

## 2021-09-08 ENCOUNTER — Other Ambulatory Visit: Payer: Self-pay | Admitting: Emergency Medicine

## 2021-09-08 DIAGNOSIS — I10 Essential (primary) hypertension: Secondary | ICD-10-CM

## 2021-09-12 ENCOUNTER — Other Ambulatory Visit: Payer: Self-pay

## 2021-09-12 ENCOUNTER — Other Ambulatory Visit (INDEPENDENT_AMBULATORY_CARE_PROVIDER_SITE_OTHER): Payer: BC Managed Care – PPO

## 2021-09-12 DIAGNOSIS — E890 Postprocedural hypothyroidism: Secondary | ICD-10-CM | POA: Diagnosis not present

## 2021-09-13 LAB — TSH: TSH: 0.36 u[IU]/mL (ref 0.35–5.50)

## 2021-09-13 LAB — T4, FREE: Free T4: 1.23 ng/dL (ref 0.60–1.60)

## 2021-09-15 ENCOUNTER — Encounter: Payer: Self-pay | Admitting: Endocrinology

## 2021-09-15 ENCOUNTER — Ambulatory Visit (INDEPENDENT_AMBULATORY_CARE_PROVIDER_SITE_OTHER): Payer: BC Managed Care – PPO | Admitting: Endocrinology

## 2021-09-15 VITALS — BP 140/86 | HR 66 | Ht 64.0 in | Wt 236.6 lb

## 2021-09-15 DIAGNOSIS — E890 Postprocedural hypothyroidism: Secondary | ICD-10-CM | POA: Diagnosis not present

## 2021-09-15 NOTE — Patient Instructions (Signed)
Take 1 pill daily and 1/2 once a week ?

## 2021-09-15 NOTE — Progress Notes (Signed)
? ? ? ?Patient ID: Danielle Khan, female   DOB: [birth year], 51 y.o.   MRN: [id number] ? ? ? ?Reason for Appointment:  Hypothyroidism, followup visit ? ? ? History of Present Illness:  ? ?The patient was first seen in consultation in 03/2012 for management of her hyperthyroidism. ?She had had Graves' disease since the year 2001 and had been on long-term methimazole treatment but had not been on any dictations for 2 years prior to evaluation. ?Because of persistent hyperthyroidism and a free T4 level of 1.65 she was given 16.75 mCi of I-131 on 05/02/12 ? ?When seen for followup in 3/15 she was significantly hypothyroid with symptoms of feeling less cold, tired and  some hair shedding.  ?She initially given 150 mcg on her first visit  ?  ?However because of relatively low TSH in 6/15  her dose was reduced to 125 mcg but subsequently went back to 150 mcg ? ?RECENT HISTORY: ? ?She has been on variable doses, currently using generic levothyroxine, previously was on Euthyrox ? ?Previously when she is hypothyroidism she usually is complaining of feeling cold and tired as well as having some hair loss ? ?Recently feels fairly good ?She thinks that after changing her dose on the last visit she has less fatigue ?She has lost weight from working on her diet and has lost another 4 pounds ? ?Currently taking 112 mcg levothyroxine, previously on 137 ?TSH which was suppressed is now 0.36 ? ?Free T4 not changed ? ? ? ?Lab Results  ?Component Value Date  ? TSH 0.36 09/12/2021  ? TSH 0.05 (L) 07/12/2021  ? TSH 0.11 (L) 04/10/2021  ? FREET4 1.23 09/12/2021  ? FREET4 1.25 07/11/2021  ? FREET4 1.21 06/23/2020  ? ? ?Wt Readings from Last 3 Encounters:  ?09/15/21 236 lb 9.6 oz (107.3 kg)  ?07/14/21 240 lb 3.2 oz (109 kg)  ?04/10/21 252 lb (114.3 kg)  ? ? ? ?Allergies as of 09/15/2021   ?No Known Allergies ?  ? ?  ?Medication List  ?  ? ?  ? Accurate as of September 15, 2021 10:48 AM. If you have any questions, ask your nurse  or doctor.  ?  ?  ? ?  ? ?atenolol 50 MG tablet ?Commonly known as: TENORMIN ?Take 1 tablet by mouth once daily ?  ?levothyroxine 112 MCG tablet ?Commonly known as: SYNTHROID ?Take 1 tablet (112 mcg total) by mouth daily. ?  ?losartan-hydrochlorothiazide 50-12.5 MG tablet ?Commonly known as: HYZAAR ?Take 1 tablet by mouth once daily ?  ?MAGNESIUM PO ?Take by mouth once a week. ?  ?metFORMIN 500 MG tablet ?Commonly known as: GLUCOPHAGE ?Take 1 tablet (500 mg total) by mouth 2 (two) times daily with a meal. ?  ?psyllium 58.6 % powder ?Commonly known as: METAMUCIL ?Take 1 packet by mouth as needed. ?  ?rosuvastatin 10 MG tablet ?Commonly known as: Crestor ?Take 1 tablet (10 mg total) by mouth daily. ?  ?triamcinolone 55 MCG/ACT Aero nasal inhaler ?Commonly known as: Nasacort AQ ?Place 2 sprays into the nose daily as needed. ?  ?VITAMIN B 12 PO ?Take 5,000 mg by mouth every other day. ?  ? ?  ? ? ?Allergies: No Known Allergies ? ?Past Medical History:  ?Diagnosis Date  ? Chronic constipation   ? uses metamucil  ? GERD (gastroesophageal reflux disease)   ? Hypertension   ? Thyroid disease   ? ? ?Past Surgical History:  ?Procedure Laterality Date  ? ABDOMINAL HYSTERECTOMY    ? ? ?  Family History  ?Problem Relation Age of Onset  ? Breast cancer Maternal Aunt   ? Ovarian cancer Maternal Aunt   ? Colon cancer Neg Hx   ? Colon polyps Neg Hx   ? Esophageal cancer Neg Hx   ? Rectal cancer Neg Hx   ? Stomach cancer Neg Hx   ? ? ?Social History:  reports that she has never smoked. Her smokeless tobacco use includes snuff. She reports that she does not drink alcohol and does not use drugs. ? ?REVIEW Of SYSTEMS: ? ? ?Hypertension: She has had hypertension followed by her PCP ? ? ?BP Readings from Last 3 Encounters:  ?09/15/21 140/86  ?07/14/21 128/90  ?04/10/21 130/80  ? ?She has mild diabetes on metformin, recently has lost significant weight and is waiting for follow-up with PCP ? ? ?Lab Results  ?Component Value Date  ? HGBA1C  6.9 (H) 04/10/2021  ? HGBA1C 6.2 (H) 12/24/2019  ? HGBA1C 6.2 (H) 06/24/2019  ? ?Lab Results  ?Component Value Date  ? MICROALBUR 1.56 08/15/2013  ? LDLCALC 118 (H) 12/24/2019  ? CREATININE 0.90 04/10/2021  ? ? ? Examination: ?  ?BP 140/86   Pulse 66   Ht 5\' 4"  (1.626 m)   Wt 236 lb 9.6 oz (107.3 kg)   SpO2 99%   BMI 40.61 kg/m?  ? ? ? ?  Assessment  ? ?Hypothyroidism, post ablative for several years ? ?Her levothyroxine requirement has fluctuated between 112 and 150 mcg over the last couple of years ? ?She is now requiring lower than usual doses ?Even with 112 mcg levothyroxine her TSH is low normal at 0.36 ?Over the last few months has lost weight ?Currently feels fairly good subjectively ? ? ? ? Treatment: ? ?She will now reduce her dose to 6-1/2 tablets a week of the 112 mcg levothyroxine  ?Unless she has unusual fatigue she can come back in 6 months ? ? ? ?09/15/2021, 10:48 AM  ? ?

## 2021-10-12 ENCOUNTER — Ambulatory Visit: Payer: BC Managed Care – PPO | Admitting: Emergency Medicine

## 2021-10-12 ENCOUNTER — Encounter: Payer: Self-pay | Admitting: Emergency Medicine

## 2021-10-12 VITALS — BP 128/84 | HR 68 | Temp 98.5°F | Ht 64.0 in | Wt 235.2 lb

## 2021-10-12 DIAGNOSIS — H119 Unspecified disorder of conjunctiva: Secondary | ICD-10-CM | POA: Diagnosis not present

## 2021-10-12 DIAGNOSIS — E785 Hyperlipidemia, unspecified: Secondary | ICD-10-CM | POA: Diagnosis not present

## 2021-10-12 DIAGNOSIS — E1165 Type 2 diabetes mellitus with hyperglycemia: Secondary | ICD-10-CM | POA: Diagnosis not present

## 2021-10-12 DIAGNOSIS — E890 Postprocedural hypothyroidism: Secondary | ICD-10-CM

## 2021-10-12 DIAGNOSIS — H159 Unspecified disorder of sclera: Secondary | ICD-10-CM | POA: Insufficient documentation

## 2021-10-12 DIAGNOSIS — E1159 Type 2 diabetes mellitus with other circulatory complications: Secondary | ICD-10-CM | POA: Diagnosis not present

## 2021-10-12 DIAGNOSIS — I152 Hypertension secondary to endocrine disorders: Secondary | ICD-10-CM

## 2021-10-12 DIAGNOSIS — E1169 Type 2 diabetes mellitus with other specified complication: Secondary | ICD-10-CM

## 2021-10-12 LAB — POCT GLYCOSYLATED HEMOGLOBIN (HGB A1C): Hemoglobin A1C: 6.1 % — AB (ref 4.0–5.6)

## 2021-10-12 LAB — COMPREHENSIVE METABOLIC PANEL
ALT: 12 U/L (ref 0–35)
AST: 13 U/L (ref 0–37)
Albumin: 3.9 g/dL (ref 3.5–5.2)
Alkaline Phosphatase: 143 U/L — ABNORMAL HIGH (ref 39–117)
BUN: 13 mg/dL (ref 6–23)
CO2: 31 mEq/L (ref 19–32)
Calcium: 9 mg/dL (ref 8.4–10.5)
Chloride: 102 mEq/L (ref 96–112)
Creatinine, Ser: 0.85 mg/dL (ref 0.40–1.20)
GFR: 78.88 mL/min (ref >60.00)
Glucose, Bld: 97 mg/dL (ref 70–99)
Potassium: 3.5 mEq/L (ref 3.5–5.1)
Sodium: 141 mEq/L (ref 135–145)
Total Bilirubin: 0.6 mg/dL (ref 0.2–1.2)
Total Protein: 7.5 g/dL (ref 6.0–8.3)

## 2021-10-12 LAB — LIPID PANEL
Cholesterol: 127 mg/dL (ref 0–200)
HDL: 46.1 mg/dL (ref >39.00)
LDL Cholesterol: 61 mg/dL (ref 0–99)
NonHDL: 80.87
Total CHOL/HDL Ratio: 3
Triglycerides: 101 mg/dL (ref 0.0–149.0)
VLDL: 20.2 mg/dL (ref 0.0–40.0)

## 2021-10-12 NOTE — Patient Instructions (Signed)

## 2021-10-12 NOTE — Assessment & Plan Note (Signed)
Several weeks presence.  Needs evaluation by ophthalmologist. ?

## 2021-10-12 NOTE — Progress Notes (Signed)
Fredrich Birksynthia Rena Tillman Stewart ?51 y.o. ? ? ?Chief Complaint  ?Patient presents with  ? Follow-up  ? bump on her left eye lid  ? ? ?HISTORY OF PRESENT ILLNESS: ?This is a 51 y.o. female with history of hypertension diabetes and dyslipidemia here for follow-up. ?Doing well.  Eating better.  Feeling better. ?Also complaining of lesion to her left thigh for the past several weeks.  No visual disturbances.  No pain. ?No other complaints or medical concerns today. ? ?HPI ? ? ?Prior to Admission medications   ?Medication Sig Start Date End Date Taking? Authorizing Provider  ?atenolol (TENORMIN) 50 MG tablet Take 1 tablet by mouth once daily 09/08/21  Yes Kyshawn Teal, Eilleen KempfMiguel Jose, MD  ?Cyanocobalamin (VITAMIN B 12 PO) Take 5,000 mg by mouth every other day.   Yes [provider]  ?levothyroxine (SYNTHROID) 112 MCG tablet Take 1 tablet (112 mcg total) by mouth daily. 07/14/21  Yes Reather LittlerKumar, Ajay, MD  ?losartan-hydrochlorothiazide Mauri Reading(HYZAAR) 50-12.5 MG tablet Take 1 tablet by mouth once daily 09/08/21  Yes Redina Zeller, Eilleen KempfMiguel Jose, MD  ?MAGNESIUM PO Take by mouth once a week.   Yes [provider]  ?metFORMIN (GLUCOPHAGE) 500 MG tablet Take 1 tablet (500 mg total) by mouth 2 (two) times daily with a meal. 04/11/21  Yes Ivannia Willhelm, Eilleen KempfMiguel Jose, MD  ?psyllium (METAMUCIL) 58.6 % powder Take 1 packet by mouth as needed.   Yes [provider]  ?rosuvastatin (CRESTOR) 10 MG tablet Take 1 tablet (10 mg total) by mouth daily. 04/11/21  Yes Tehran Rabenold, Eilleen KempfMiguel Jose, MD  ?triamcinolone (NASACORT AQ) 55 MCG/ACT AERO nasal inhaler Place 2 sprays into the nose daily as needed. 06/09/18  Yes Peyton NajjarHopper, David H, MD  ? ? ?No Known Allergies ? ?Patient Active Problem List  ? Diagnosis Date Noted  ? Morbid obesity (HCC) 10/06/2020  ? Prediabetes 12/24/2019  ? Anemia 12/24/2019  ? Hypothyroidism, postradioiodine therapy 08/26/2014  ? Other postablative hypothyroidism 08/19/2013  ? Hypertension 08/19/2013  ? ? ?Past Medical History:   ?Diagnosis Date  ? Chronic constipation   ? uses metamucil  ? GERD (gastroesophageal reflux disease)   ? Hypertension   ? Thyroid disease   ? ? ?Past Surgical History:  ?Procedure Laterality Date  ? ABDOMINAL HYSTERECTOMY    ? ? ?Social History  ? ?Socioeconomic History  ? Marital status: Married  ?  Spouse name: Not on file  ? Number of children: Not on file  ? Years of education: Not on file  ? Highest education level: Not on file  ?Occupational History  ? Not on file  ?Tobacco Use  ? Smoking status: Never  ? Smokeless tobacco: Current  ?  Types: Snuff  ? Tobacco comments:  ?  last used 10/26/19  ?Substance and Sexual Activity  ? Alcohol use: No  ?  Alcohol/week: 0.0 standard drinks  ? Drug use: No  ? Sexual activity: Yes  ?Other Topics Concern  ? Not on file  ?Social History Narrative  ? Not on file  ? ?Social Determinants of Health  ? ?Financial Resource Strain: Not on file  ?Food Insecurity: Not on file  ?Transportation Needs: Not on file  ?Physical Activity: Not on file  ?Stress: Not on file  ?Social Connections: Not on file  ?Intimate Partner Violence: Not on file  ? ? ?Family History  ?Problem Relation Age of Onset  ? Breast cancer Maternal Aunt   ? Ovarian cancer Maternal Aunt   ? Colon cancer Neg Hx   ?  Colon polyps Neg Hx   ? Esophageal cancer Neg Hx   ? Rectal cancer Neg Hx   ? Stomach cancer Neg Hx   ? ? ? ?Review of Systems  ?Constitutional: Negative.  Negative for chills and fever.  ?HENT: Negative.  Negative for congestion and sore throat.   ?Respiratory: Negative.  Negative for cough and shortness of breath.   ?Cardiovascular: Negative.  Negative for chest pain and palpitations.  ?Gastrointestinal: Negative.  Negative for abdominal pain, nausea and vomiting.  ?Genitourinary: Negative.   ?Musculoskeletal: Negative.   ?Skin: Negative.  Negative for rash.  ?Neurological:  Negative for dizziness and headaches.  ?All other systems reviewed and are negative. ? ?Today's Vitals  ? 10/12/21 0902  ?BP:  128/84  ?Pulse: 68  ?Temp: 98.5 ?F (36.9 ?C)  ?TempSrc: Oral  ?SpO2: 97%  ?Weight: 235 lb 4 oz (106.7 kg)  ?Height: 5\' 4"  (1.626 m)  ? ?Body mass index is 40.38 kg/m?. ?Wt Readings from Last 3 Encounters:  ?10/12/21 235 lb 4 oz (106.7 kg)  ?09/15/21 236 lb 9.6 oz (107.3 kg)  ?07/14/21 240 lb 3.2 oz (109 kg)  ? ? ?Physical Exam ?Vitals reviewed.  ?Constitutional:   ?   Appearance: Normal appearance.  ?HENT:  ?   Head: Normocephalic.  ?   Mouth/Throat:  ?   Mouth: Mucous membranes are moist.  ?   Pharynx: Oropharynx is clear.  ?Eyes:  ?   Extraocular Movements: Extraocular movements intact.  ?   Pupils: Pupils are equal, round, and reactive to light.  ?Cardiovascular:  ?   Rate and Rhythm: Normal rate and regular rhythm.  ?   Pulses: Normal pulses.  ?   Heart sounds: Normal heart sounds.  ?Pulmonary:  ?   Effort: Pulmonary effort is normal.  ?   Breath sounds: Normal breath sounds.  ?Musculoskeletal:  ?   Cervical back: No tenderness.  ?Lymphadenopathy:  ?   Cervical: No cervical adenopathy.  ?Skin: ?   General: Skin is warm and dry.  ?Neurological:  ?   General: No focal deficit present.  ?   Mental Status: She is alert and oriented to person, place, and time.  ?Psychiatric:     ?   Mood and Affect: Mood normal.     ?   Behavior: Behavior normal.  ? ? ? ? ?Results for orders placed or performed in visit on 10/12/21 (from the past 24 hour(s))  ?POCT HgB A1C     Status: Abnormal  ? Collection Time: 10/12/21  9:36 AM  ?Result Value Ref Range  ? Hemoglobin A1C 6.1 (A) 4.0 - 5.6 %  ? HbA1c POC (<> result, manual entry)    ? HbA1c, POC (prediabetic range)    ? HbA1c, POC (controlled diabetic range)    ? ? ?ASSESSMENT & PLAN: ?A total of 50 minutes was spent with the patient and counseling/coordination of care regarding preparing for this visit, review of most recent office visit notes, review of most recent blood work results including today's hemoglobin A1c, review of most recent blood work results, education on  nutrition, cardiovascular risks associated with hypertension and diabetes, prognosis, need to follow-up with ophthalmologist for eye lesion, documentation and need for follow-up. ? ?Problem List Items Addressed This Visit   ? ?  ? Cardiovascular and Mediastinum  ? Hypertension associated with diabetes (HCC) - Primary  ?  Well-controlled hypertension.  Continue Hyzaar 50-12.5 mg, and atenolol 50 mg daily. ?Well-controlled diabetes with hemoglobin A1c of 6.1. ?  Continue metformin 500 mg twice a day. ?Diet and nutrition discussed. ?Follow-up in 6 months. ? ?  ?  ? Relevant Orders  ? Comprehensive metabolic panel  ?  ? Endocrine  ? Hypothyroidism, postradioiodine therapy  ?  Clinically euthyroid.  TSH done today. ?Continue Synthroid 112 mcg daily. ? ?  ?  ? Dyslipidemia associated with type 2 diabetes mellitus (HCC)  ?  Diet and nutrition discussed.  Stable. ?Continue rosuvastatin 10 mg daily. ?The 10-year ASCVD risk score (Arnett DK, et al., 2019) is: 5.2% ?  Values used to calculate the score: ?    Age: 66 years ?    Sex: Female ?    Is Non-Hispanic African American: No ?    Diabetic: Yes ?    Tobacco smoker: No ?    Systolic Blood Pressure: 128 mmHg ?    Is BP treated: Yes ?    HDL Cholesterol: 41 mg/dL ?    Total Cholesterol: 195 mg/dL ? ? ?  ?  ? Relevant Orders  ? Lipid panel  ?  ? Other  ? Scleral lesion  ?  Several weeks presence.  Needs evaluation by ophthalmologist. ? ?  ?  ? ?Other Visit Diagnoses   ? ? Type 2 diabetes mellitus with hyperglycemia, without long-term current use of insulin (HCC)      ? Relevant Orders  ? POCT HgB A1C (Completed)  ? ?  ? ?Patient Instructions  ?Diabetes Mellitus and Nutrition, Adult ?When you have diabetes, or diabetes mellitus, it is very important to have healthy eating habits because your blood sugar (glucose) levels are greatly affected by what you eat and drink. Eating healthy foods in the right amounts, at about the same times every day, can help you: ?Manage your blood  glucose. ?Lower your risk of heart disease. ?Improve your blood pressure. ?Reach or maintain a healthy weight. ?What can affect my meal plan? ?Every person with diabetes is different, and each person has different needs for a

## 2021-10-12 NOTE — Assessment & Plan Note (Signed)
Clinically euthyroid.  TSH done today. ?Continue Synthroid 112 mcg daily. ?

## 2021-10-12 NOTE — Assessment & Plan Note (Signed)
Diet and nutrition discussed.  Stable. ?Continue rosuvastatin 10 mg daily. ?The 10-year ASCVD risk score (Arnett DK, et al., 2019) is: 5.2% ?  Values used to calculate the score: ?    Age: 51 years ?    Sex: Female ?    Is Non-Hispanic African American: No ?    Diabetic: Yes ?    Tobacco smoker: No ?    Systolic Blood Pressure: 128 mmHg ?    Is BP treated: Yes ?    HDL Cholesterol: 41 mg/dL ?    Total Cholesterol: 195 mg/dL ? ?

## 2021-10-12 NOTE — Assessment & Plan Note (Signed)
Well-controlled hypertension.  Continue Hyzaar 50-12.5 mg, and atenolol 50 mg daily. ?Well-controlled diabetes with hemoglobin A1c of 6.1. ?Continue metformin 500 mg twice a day. ?Diet and nutrition discussed. ?Follow-up in 6 months. ?

## 2021-12-11 ENCOUNTER — Other Ambulatory Visit: Payer: Self-pay | Admitting: Emergency Medicine

## 2021-12-11 DIAGNOSIS — I10 Essential (primary) hypertension: Secondary | ICD-10-CM

## 2022-01-12 ENCOUNTER — Other Ambulatory Visit: Payer: Self-pay | Admitting: Endocrinology

## 2022-01-12 DIAGNOSIS — E890 Postprocedural hypothyroidism: Secondary | ICD-10-CM

## 2022-01-17 ENCOUNTER — Other Ambulatory Visit: Payer: Self-pay

## 2022-01-17 DIAGNOSIS — E890 Postprocedural hypothyroidism: Secondary | ICD-10-CM

## 2022-01-17 MED ORDER — LEVOTHYROXINE SODIUM 112 MCG PO TABS: 112.0000 ug | ORAL_TABLET | Freq: Every day | ORAL | 2 refills | Status: DC

## 2022-03-20 ENCOUNTER — Other Ambulatory Visit (INDEPENDENT_AMBULATORY_CARE_PROVIDER_SITE_OTHER): Payer: BC Managed Care – PPO

## 2022-03-20 DIAGNOSIS — E890 Postprocedural hypothyroidism: Secondary | ICD-10-CM | POA: Diagnosis not present

## 2022-03-21 LAB — TSH: TSH: 2.02 u[IU]/mL (ref 0.35–5.50)

## 2022-03-21 LAB — T4, FREE: Free T4: 1.06 ng/dL (ref 0.60–1.60)

## 2022-03-23 ENCOUNTER — Encounter: Payer: Self-pay | Admitting: Endocrinology

## 2022-03-23 ENCOUNTER — Ambulatory Visit (INDEPENDENT_AMBULATORY_CARE_PROVIDER_SITE_OTHER): Payer: BC Managed Care – PPO | Admitting: Endocrinology

## 2022-03-23 VITALS — BP 122/88 | HR 86 | Ht 64.0 in | Wt 236.6 lb

## 2022-03-23 DIAGNOSIS — E890 Postprocedural hypothyroidism: Secondary | ICD-10-CM | POA: Diagnosis not present

## 2022-03-23 NOTE — Progress Notes (Signed)
Patient ID: Danielle Khan, female   DOB: 27-Oct-1970, 51 y.o.   MRN: [id number]    Reason for Appointment:  Hypothyroidism, followup visit    History of Present Illness:   The patient was first seen in consultation in 03/2012 for management of her hyperthyroidism. She had had Graves' disease since the year 2001 and had been on long-term methimazole treatment but had not been on any dictations for 2 years prior to evaluation. Because of persistent hyperthyroidism and a free T4 level of 1.65 she was given 16.75 mCi of I-131 on 05/02/12  When seen for followup in 3/15 she was significantly hypothyroid with symptoms of feeling less cold, tired and  some hair shedding.  She initially given 150 mcg on her first visit    However because of relatively low TSH in 6/15  her dose was reduced to 125 mcg but subsequently went back to 150 mcg  RECENT HISTORY:  She has been on variable doses, currently using generic levothyroxine, previously was on Euthyrox  Previously when she is hypothyroidism she usually is complaining of feeling cold and tired as well as having some hair loss  Since her last visit she has not had any unusual fatigue Weight has leveled off On her last visit because of low normal TSH she was told to take 6-1/2 tablets weekly but her dose was continued at 112 mcg levothyroxine  TSH which was 0.36 is now back to 2.0   Free T4 relatively lower   Lab Results  Component Value Date   TSH 2.02 03/20/2022   TSH 0.36 09/12/2021   TSH 0.05 (L) 07/12/2021   FREET4 1.06 03/20/2022   FREET4 1.23 09/12/2021   FREET4 1.25 07/11/2021    Wt Readings from Last 3 Encounters:  03/23/22 236 lb 9.6 oz (107.3 kg)  10/12/21 235 lb 4 oz (106.7 kg)  09/15/21 236 lb 9.6 oz (107.3 kg)     Allergies as of 03/23/2022   No Known Allergies      Medication List        Accurate as of March 23, 2022 10:59 AM. If you have any questions, ask your nurse or doctor.           atenolol 50 MG tablet Commonly known as: TENORMIN Take 1 tablet by mouth once daily   levothyroxine 112 MCG tablet Commonly known as: SYNTHROID Take 1 tablet (112 mcg total) by mouth daily.   losartan-hydrochlorothiazide 50-12.5 MG tablet Commonly known as: HYZAAR Take 1 tablet by mouth once daily   MAGNESIUM PO Take by mouth once a week.   metFORMIN 500 MG tablet Commonly known as: GLUCOPHAGE Take 1 tablet (500 mg total) by mouth 2 (two) times daily with a meal.   psyllium 58.6 % powder Commonly known as: METAMUCIL Take 1 packet by mouth as needed.   rosuvastatin 10 MG tablet Commonly known as: Crestor Take 1 tablet (10 mg total) by mouth daily.   triamcinolone 55 MCG/ACT Aero nasal inhaler Commonly known as: Nasacort AQ Place 2 sprays into the nose daily as needed.   VITAMIN B 12 PO Take 5,000 mg by mouth every other day.        Allergies: No Known Allergies  Past Medical History:  Diagnosis Date   Chronic constipation    uses metamucil   GERD (gastroesophageal reflux disease)    Hypertension    Thyroid disease     Past Surgical History:  Procedure Laterality Date   ABDOMINAL  HYSTERECTOMY      Family History  Problem Relation Age of Onset   Breast cancer Maternal Aunt    Ovarian cancer Maternal Aunt    Colon cancer Neg Hx    Colon polyps Neg Hx    Esophageal cancer Neg Hx    Rectal cancer Neg Hx    Stomach cancer Neg Hx     Social History:  reports that she has never smoked. Her smokeless tobacco use includes snuff. She reports that she does not drink alcohol and does not use drugs.  REVIEW Of SYSTEMS:   Hypertension: She has had hypertension followed by her PCP   BP Readings from Last 3 Encounters:  03/23/22 122/88  10/12/21 128/84  09/15/21 140/86   She has mild diabetes on metformin, and 2023 lost  significant weight and is waiting for follow-up with PCP   Lab Results  Component Value Date   HGBA1C 6.1 (A)  10/12/2021   HGBA1C 6.9 (H) 04/10/2021   HGBA1C 6.2 (H) 12/24/2019   Lab Results  Component Value Date   MICROALBUR 1.56 08/15/2013   LDLCALC 61 10/12/2021   CREATININE 0.85 10/12/2021     Examination:   BP 122/88   Pulse 86   Ht 5\' 4"  (1.626 m)   Wt 236 lb 9.6 oz (107.3 kg)   SpO2 99%   BMI 40.61 kg/m   Biceps reflexes appear normal    Assessment   Hypothyroidism, post ablative for several years  Her levothyroxine requirement has fluctuated between 112 and 150 mcg over the last couple of years  She is continuing to require relatively lower levothyroxine supplement to maintain her TSH in the normal range Now with 112 mcg levothyroxine taking 6-1/2 tablets/week her TSH is back to normal at 2 She has not lost any further weight Subjectively doing well    Treatment:  She will now continue her dose of 6-1/2 tablets a week of the 112 mcg levothyroxine  Unless she has unusual fatigue she can come back in 6 months   Danielle Khan 03/23/2022, 10:59 AM

## 2022-04-03 LAB — HM MAMMOGRAPHY

## 2022-04-18 ENCOUNTER — Ambulatory Visit: Payer: BC Managed Care – PPO | Admitting: Emergency Medicine

## 2022-04-30 ENCOUNTER — Ambulatory Visit: Payer: BC Managed Care – PPO | Admitting: Emergency Medicine

## 2022-05-02 ENCOUNTER — Ambulatory Visit: Payer: BC Managed Care – PPO | Admitting: Emergency Medicine

## 2022-05-02 ENCOUNTER — Encounter: Payer: Self-pay | Admitting: Emergency Medicine

## 2022-05-02 VITALS — BP 136/88 | HR 73 | Temp 98.5°F | Ht 64.0 in | Wt 240.0 lb

## 2022-05-02 DIAGNOSIS — I152 Hypertension secondary to endocrine disorders: Secondary | ICD-10-CM

## 2022-05-02 DIAGNOSIS — E1169 Type 2 diabetes mellitus with other specified complication: Secondary | ICD-10-CM | POA: Diagnosis not present

## 2022-05-02 DIAGNOSIS — Z23 Encounter for immunization: Secondary | ICD-10-CM

## 2022-05-02 DIAGNOSIS — E1159 Type 2 diabetes mellitus with other circulatory complications: Secondary | ICD-10-CM | POA: Diagnosis not present

## 2022-05-02 DIAGNOSIS — E890 Postprocedural hypothyroidism: Secondary | ICD-10-CM

## 2022-05-02 DIAGNOSIS — E785 Hyperlipidemia, unspecified: Secondary | ICD-10-CM

## 2022-05-02 LAB — POCT GLYCOSYLATED HEMOGLOBIN (HGB A1C): Hemoglobin A1C: 5.9 % — AB (ref 4.0–5.6)

## 2022-05-02 MED ORDER — TIRZEPATIDE 5 MG/0.5ML ~~LOC~~ SOAJ: 5.0000 mg | SUBCUTANEOUS | 3 refills | Status: DC

## 2022-05-02 NOTE — Assessment & Plan Note (Signed)
Stable.  Diet and nutrition discussed.  Continue rosuvastatin 10 mg daily.  

## 2022-05-02 NOTE — Assessment & Plan Note (Signed)
Diet and nutrition discussed. Recommend to continue metformin and start weekly Mounjaro 5 mg Follow-up in 3 months

## 2022-05-02 NOTE — Progress Notes (Signed)
Danielle Khan [name] 51 y.o.   Chief Complaint  Patient presents with   Follow-up    f/u appt, no concerns     HISTORY OF PRESENT ILLNESS: This is a 51 y.o. female A1A here for 24-month follow-up of diabetes and hypertension. Overall doing well.  Has no complaints or medical concerns today.  HPI   Prior to Admission medications   Medication Sig Start Date End Date Taking? Authorizing Provider  atenolol (TENORMIN) 50 MG tablet Take 1 tablet by mouth once daily 12/11/21  Yes Ziana Heyliger, [place], MD  Cyanocobalamin (VITAMIN B 12 PO) Take 5,000 mg by mouth every other day.   Yes [provider]  levothyroxine (SYNTHROID) 112 MCG tablet Take 1 tablet (112 mcg total) by mouth daily. 01/17/22  Yes Reather Littler, MD  losartan-hydrochlorothiazide [facility]) 50-12.5 MG tablet Take 1 tablet by mouth once daily 12/11/21  Yes Jamila Slatten, Eilleen Kempf, MD  MAGNESIUM PO Take by mouth once a week.   Yes [provider]  metFORMIN (GLUCOPHAGE) 500 MG tablet Take 1 tablet (500 mg total) by mouth 2 (two) times daily with a meal. 04/11/21  Yes Chang Tiggs, Eilleen Kempf, MD  psyllium (METAMUCIL) 58.6 % powder Take 1 packet by mouth as needed.   Yes [provider]  rosuvastatin (CRESTOR) 10 MG tablet Take 1 tablet (10 mg total) by mouth daily. 04/11/21  Yes Anthonyjames Bargar, Eilleen Kempf, MD  triamcinolone (NASACORT AQ) 55 MCG/ACT AERO nasal inhaler Place 2 sprays into the nose daily as needed. 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   Hypertension associated with diabetes (HCC) 08/19/2013    Past Medical History:  Diagnosis Date    Chronic constipation    uses metamucil   GERD (gastroesophageal reflux disease)    Hypertension    Thyroid disease     Past Surgical History:  Procedure Laterality Date   ABDOMINAL HYSTERECTOMY      Social History   Socioeconomic History   Marital status: Married    Spouse name: Not on file   Number of children: Not on file   Years of education: Not on file   Highest education level: Not on file  Occupational History   Not on file  Tobacco Use   Smoking status: Never   Smokeless tobacco: Current    Types: Snuff   Tobacco comments:    last used 10/26/19  Substance and Sexual Activity   Alcohol use: No    Alcohol/week: 0.0 standard drinks of alcohol   Drug use: No   Sexual activity: Yes  Other Topics Concern   Not on file  Social History Narrative   Not on file   Social Determinants of Health   Financial Resource Strain: Not on file  Food Insecurity: Not on file  Transportation Needs: Not on file  Physical Activity: Not on file  Stress: Not on file  Social Connections: Not on file  Intimate Partner Violence: Not on file    Family History  Problem Relation Age of Onset   Breast cancer Maternal Aunt    Ovarian cancer Maternal Aunt    Colon cancer Neg Hx    Colon polyps  Neg Hx    Esophageal cancer Neg Hx    Rectal cancer Neg Hx    Stomach cancer Neg Hx      Review of Systems  Constitutional: Negative.  Negative for chills and fever.  HENT: Negative.  Negative for congestion and sore throat.   Respiratory: Negative.  Negative for cough and shortness of breath.   Cardiovascular: Negative.  Negative for chest pain and palpitations.  Gastrointestinal: Negative.  Negative for abdominal pain, diarrhea, nausea and vomiting.  Genitourinary: Negative.  Negative for dysuria and hematuria.  Skin: Negative.  Negative for rash.  Neurological: Negative.  Negative for dizziness and headaches.  All other systems reviewed and are negative.  Today's Vitals   05/02/22  1602  BP: 136/88  Pulse: 73  Temp: 98.5 F (36.9 C)  TempSrc: Oral  SpO2: 97%  Weight: 240 lb (108.9 kg)  Height: 5\' 4"  (1.626 m)   Body mass index is 41.2 kg/m. Wt Readings from Last 3 Encounters:  05/02/22 240 lb (108.9 kg)  03/23/22 236 lb 9.6 oz (107.3 kg)  10/12/21 235 lb 4 oz (106.7 kg)     Physical Exam Vitals reviewed.  Constitutional:      Appearance: Normal appearance.  Eyes:     Extraocular Movements: Extraocular movements intact.     Pupils: Pupils are equal, round, and reactive to light.  Cardiovascular:     Rate and Rhythm: Normal rate and regular rhythm.     Pulses: Normal pulses.     Heart sounds: Normal heart sounds.  Pulmonary:     Effort: Pulmonary effort is normal.     Breath sounds: Normal breath sounds.  Musculoskeletal:        General: Normal range of motion.     Cervical back: No tenderness.  Lymphadenopathy:     Cervical: No cervical adenopathy.  Skin:    General: Skin is warm and dry.  Neurological:     General: No focal deficit present.     Mental Status: She is alert and oriented to person, place, and time.  Psychiatric:        Mood and Affect: Mood normal.        Behavior: Behavior normal.    Results for orders placed or performed in visit on 05/02/22 (from the past 24 hour(s))  POCT HgB A1C     Status: Abnormal   Collection Time: 05/02/22  4:27 PM  Result Value Ref Range   Hemoglobin A1C 5.9 (A) 4.0 - 5.6 %   HbA1c POC (<> result, manual entry)     HbA1c, POC (prediabetic range)     HbA1c, POC (controlled diabetic range)       ASSESSMENT & PLAN: A total of 45 minutes was spent with the patient and counseling/coordination of care regarding preparing for this visit, review of most recent office visit notes, review of most recent blood work results including interpretation of today's hemoglobin A1c, review of multiple chronic medical conditions under management, review of all medications and changes made, education on nutrition,  cardiovascular risks associated with hypertension and diabetes, prognosis, documentation, and need for follow-up in 3 months.  Problem List Items Addressed This Visit       Cardiovascular and Mediastinum   Hypertension associated with diabetes (HCC) - Primary    Well-controlled hypertension. Continue Hyzaar 50-12.5 mg daily and atenolol 50 mg daily. BP Readings from Last 3 Encounters:  05/02/22 136/88  03/23/22 122/88  10/12/21 128/84  Well-controlled diabetes with hemoglobin A1c of 5.9.  Continue metformin 500 mg twice a day We will add weekly Mounjaro 5 mg to help with both diabetes and morbid obesity. Cardiovascular risks associated with hypertension and diabetes discussed. Diet and nutrition discussed. Follow-up in 3 months.       Relevant Medications   tirzepatide (MOUNJARO) 5 MG/0.5ML Pen   Other Relevant Orders   POCT HgB A1C (Completed)     Endocrine   Hypothyroidism, postradioiodine therapy    Stable.  Sees Dr. Lucianne MussKumar, endocrinologist, on a regular basis. Continue Synthroid 112 mcg daily. Lab Results  Component Value Date   TSH 2.02 03/20/2022        Dyslipidemia associated with type 2 diabetes mellitus (HCC)    Stable.  Diet and nutrition discussed. Continue rosuvastatin 10 mg daily.       Relevant Medications   tirzepatide (MOUNJARO) 5 MG/0.5ML Pen     Other   Morbid obesity (HCC)    Diet and nutrition discussed. Recommend to continue metformin and start weekly Mounjaro 5 mg Follow-up in 3 months      Relevant Medications   tirzepatide (MOUNJARO) 5 MG/0.5ML Pen   Other Visit Diagnoses     Need for vaccination       Relevant Orders   Flu Vaccine QUAD 6+ mos PF IM (Fluarix Quad PF) (Completed)      Patient Instructions  Diabetes Mellitus and Nutrition, Adult When you have diabetes, or diabetes mellitus, it is very important to have healthy eating habits because your blood sugar (glucose) levels are greatly affected by what you eat and drink.  Eating healthy foods in the right amounts, at about the same times every day, can help you: Manage your blood glucose. Lower your risk of heart disease. Improve your blood pressure. Reach or maintain a healthy weight. What can affect my meal plan? Every person with diabetes is different, and each person has different needs for a meal plan. Your health care provider may recommend that you work with a dietitian to make a meal plan that is best for you. Your meal plan may vary depending on factors such as: The calories you need. The medicines you take. Your weight. Your blood glucose, blood pressure, and cholesterol levels. Your activity level. Other health conditions you have, such as heart or kidney disease. How do carbohydrates affect me? Carbohydrates, also called carbs, affect your blood glucose level more than any other type of food. Eating carbs raises the amount of glucose in your blood. It is important to know how many carbs you can safely have in each meal. This is different for every person. Your dietitian can help you calculate how many carbs you should have at each meal and for each snack. How does alcohol affect me? Alcohol can cause a decrease in blood glucose (hypoglycemia), especially if you use insulin or take certain diabetes medicines by mouth. Hypoglycemia can be a life-threatening condition. Symptoms of hypoglycemia, such as sleepiness, dizziness, and confusion, are similar to symptoms of having too much alcohol. Do not drink alcohol if: Your health care provider tells you not to drink. You are pregnant, may be pregnant, or are planning to become pregnant. If you drink alcohol: Limit how much you have to: 0-1 drink a day for women. 0-2 drinks a day for men. Know how much alcohol is in your drink. In the [place] one drink equals one 12 oz bottle of beer (355 mL), one 5 oz glass of wine (148 mL), or one 1 oz glass of hard liquor (  44 mL). Keep yourself hydrated with water,  diet soda, or unsweetened iced tea. Keep in mind that regular soda, juice, and other mixers may contain a lot of sugar and must be counted as carbs. What are tips for following this plan?  Reading food labels Start by checking the serving size on the Nutrition Facts label of packaged foods and drinks. The number of calories and the amount of carbs, fats, and other nutrients listed on the label are based on one serving of the item. Many items contain more than one serving per package. Check the total grams (g) of carbs in one serving. Check the number of grams of saturated fats and trans fats in one serving. Choose foods that have a low amount or none of these fats. Check the number of milligrams (mg) of salt (sodium) in one serving. Most people should limit total sodium intake to less than 2,300 mg per day. Always check the nutrition information of foods labeled as "low-fat" or "nonfat." These foods may be higher in added sugar or refined carbs and should be avoided. Talk to your dietitian to identify your daily goals for nutrients listed on the label. Shopping Avoid buying canned, pre-made, or processed foods. These foods tend to be high in fat, sodium, and added sugar. Shop around the outside edge of the grocery store. This is where you will most often find fresh fruits and vegetables, bulk grains, fresh meats, and fresh dairy products. Cooking Use low-heat cooking methods, such as baking, instead of high-heat cooking methods, such as deep frying. Cook using healthy oils, such as olive, canola, or sunflower oil. Avoid cooking with butter, cream, or high-fat meats. Meal planning Eat meals and snacks regularly, preferably at the same times every day. Avoid going long periods of time without eating. Eat foods that are high in fiber, such as fresh fruits, vegetables, beans, and whole grains. Eat 4-6 oz (112-168 g) of lean protein each day, such as lean meat, chicken, fish, eggs, or tofu. One ounce  (oz) (28 g) of lean protein is equal to: 1 oz (28 g) of meat, chicken, or fish. 1 egg.  cup (62 g) of tofu. Eat some foods each day that contain healthy fats, such as avocado, nuts, seeds, and fish. What foods should I eat? Fruits Berries. Apples. Oranges. Peaches. Apricots. Plums. Grapes. Mangoes. Papayas. Pomegranates. Kiwi. Cherries. Vegetables Leafy greens, including lettuce, spinach, kale, chard, collard greens, mustard greens, and cabbage. Beets. Cauliflower. Broccoli. Carrots. Green beans. Tomatoes. Peppers. Onions. Cucumbers. Brussels sprouts. Grains Whole grains, such as whole-wheat or whole-grain bread, crackers, tortillas, cereal, and pasta. Unsweetened oatmeal. Quinoa. Brown or wild rice. Meats and other proteins Seafood. Poultry without skin. Lean cuts of poultry and beef. Tofu. Nuts. Seeds. Dairy Low-fat or fat-free dairy products such as milk, yogurt, and cheese. The items listed above may not be a complete list of foods and beverages you can eat and drink. Contact a dietitian for more information. What foods should I avoid? Fruits Fruits canned with syrup. Vegetables Canned vegetables. Frozen vegetables with butter or cream sauce. Grains Refined white flour and flour products such as bread, pasta, snack foods, and cereals. Avoid all processed foods. Meats and other proteins Fatty cuts of meat. Poultry with skin. Breaded or fried meats. Processed meat. Avoid saturated fats. Dairy Full-fat yogurt, cheese, or milk. Beverages Sweetened drinks, such as soda or iced tea. The items listed above may not be a complete list of foods and beverages you should avoid. Contact a  dietitian for more information. Questions to ask a health care provider Do I need to meet with a certified diabetes care and education specialist? Do I need to meet with a dietitian? What number can I call if I have questions? When are the best times to check my blood glucose? Where to find more  information: American Diabetes Association: diabetes.org Academy of Nutrition and Dietetics: eatright.[employer] of Diabetes and Digestive and Kidney Diseases: StageSync.si Association of Diabetes Care & Education Specialists: diabeteseducator.org Summary It is important to have healthy eating habits because your blood sugar (glucose) levels are greatly affected by what you eat and drink. It is important to use alcohol carefully. A healthy meal plan will help you manage your blood glucose and lower your risk of heart disease. Your health care provider may recommend that you work with a dietitian to make a meal plan that is best for you. This information is not intended to replace advice given to you by your health care provider. Make sure you discuss any questions you have with your health care provider. Document Revised: 01/06/2020 Document Reviewed: 01/06/2020 Elsevier Patient Education  2023 Elsevier Inc.    Edwina Barth, MD [facility]

## 2022-05-02 NOTE — Patient Instructions (Signed)

## 2022-05-02 NOTE — Assessment & Plan Note (Signed)
Stable.  Sees Dr. Lucianne Muss, endocrinologist, on a regular basis. Continue Synthroid 112 mcg daily. Lab Results  Component Value Date   TSH 2.02 03/20/2022

## 2022-05-02 NOTE — Assessment & Plan Note (Signed)
Well-controlled hypertension. Continue Hyzaar 50-12.5 mg daily and atenolol 50 mg daily. BP Readings from Last 3 Encounters:  05/02/22 136/88  03/23/22 122/88  10/12/21 128/84  Well-controlled diabetes with hemoglobin A1c of 5.9. Continue metformin 500 mg twice a day We will add weekly Mounjaro 5 mg to help with both diabetes and morbid obesity. Cardiovascular risks associated with hypertension and diabetes discussed. Diet and nutrition discussed. Follow-up in 3 months.

## 2022-05-03 ENCOUNTER — Other Ambulatory Visit: Payer: Self-pay | Admitting: Emergency Medicine

## 2022-05-03 DIAGNOSIS — E1165 Type 2 diabetes mellitus with hyperglycemia: Secondary | ICD-10-CM

## 2022-06-02 ENCOUNTER — Other Ambulatory Visit: Payer: Self-pay | Admitting: Emergency Medicine

## 2022-06-02 DIAGNOSIS — I10 Essential (primary) hypertension: Secondary | ICD-10-CM

## 2022-08-02 ENCOUNTER — Ambulatory Visit: Payer: BC Managed Care – PPO | Admitting: Emergency Medicine

## 2022-08-07 ENCOUNTER — Encounter: Payer: Self-pay | Admitting: Emergency Medicine

## 2022-08-07 ENCOUNTER — Ambulatory Visit: Payer: 59 | Admitting: Emergency Medicine

## 2022-08-07 VITALS — BP 134/76 | HR 70 | Temp 98.2°F | Ht 64.0 in | Wt 244.0 lb

## 2022-08-07 DIAGNOSIS — E1169 Type 2 diabetes mellitus with other specified complication: Secondary | ICD-10-CM | POA: Diagnosis not present

## 2022-08-07 DIAGNOSIS — I152 Hypertension secondary to endocrine disorders: Secondary | ICD-10-CM | POA: Diagnosis not present

## 2022-08-07 DIAGNOSIS — E785 Hyperlipidemia, unspecified: Secondary | ICD-10-CM | POA: Diagnosis not present

## 2022-08-07 DIAGNOSIS — E890 Postprocedural hypothyroidism: Secondary | ICD-10-CM | POA: Diagnosis not present

## 2022-08-07 DIAGNOSIS — E1159 Type 2 diabetes mellitus with other circulatory complications: Secondary | ICD-10-CM | POA: Diagnosis not present

## 2022-08-07 LAB — COMPREHENSIVE METABOLIC PANEL
ALT: 15 U/L (ref 0–35)
AST: 16 U/L (ref 0–37)
Albumin: 3.9 g/dL (ref 3.5–5.2)
Alkaline Phosphatase: 180 U/L — ABNORMAL HIGH (ref 39–117)
BUN: 13 mg/dL (ref 6–23)
CO2: 31 mEq/L (ref 19–32)
Calcium: 9.3 mg/dL (ref 8.4–10.5)
Chloride: 102 mEq/L (ref 96–112)
Creatinine, Ser: 0.96 mg/dL (ref 0.40–1.20)
GFR: 67.78 mL/min (ref >60.00)
Glucose, Bld: 111 mg/dL — ABNORMAL HIGH (ref 70–99)
Potassium: 3.5 mEq/L (ref 3.5–5.1)
Sodium: 140 mEq/L (ref 135–145)
Total Bilirubin: 0.3 mg/dL (ref 0.2–1.2)
Total Protein: 7.7 g/dL (ref 6.0–8.3)

## 2022-08-07 LAB — POCT GLYCOSYLATED HEMOGLOBIN (HGB A1C): Hemoglobin A1C: 5.9 % — AB (ref 4.0–5.6)

## 2022-08-07 LAB — MICROALBUMIN / CREATININE URINE RATIO
Creatinine,U: 147.7 mg/dL
Microalb Creat Ratio: 0.7 mg/g (ref 0.0–30.0)
Microalb, Ur: 1 mg/dL (ref 0.0–1.9)

## 2022-08-07 LAB — LIPID PANEL
Cholesterol: 200 mg/dL (ref 0–200)
HDL: 44.5 mg/dL (ref >39.00)
NonHDL: 155.24
Total CHOL/HDL Ratio: 4
Triglycerides: 300 mg/dL — ABNORMAL HIGH (ref 0.0–149.0)
VLDL: 60 mg/dL — ABNORMAL HIGH (ref 0.0–40.0)

## 2022-08-07 LAB — LDL CHOLESTEROL, DIRECT: Direct LDL: 119 mg/dL

## 2022-08-07 LAB — TSH: TSH: 2.02 u[IU]/mL (ref 0.35–5.50)

## 2022-08-07 MED ORDER — TIRZEPATIDE 5 MG/0.5ML ~~LOC~~ SOAJ: 5.0000 mg | SUBCUTANEOUS | 3 refills | Status: DC

## 2022-08-07 NOTE — Patient Instructions (Signed)

## 2022-08-07 NOTE — Progress Notes (Signed)
Danielle Khan 52 y.o.   Chief Complaint  Patient presents with   Follow-up    3 mnth f/u appt, no concerns     HISTORY OF PRESENT ILLNESS: This is a 52 y.o. female here for 94-monthfollow-up of hypertension, diabetes, and dyslipidemia. Overall doing well. Has no complaints or medical concerns today. Lab Results  Component Value Date   HGBA1C 5.9 (A) 05/02/2022   Wt Readings from Last 3 Encounters:  08/07/22 244 lb (110.7 kg)  05/02/22 240 lb (108.9 kg)  03/23/22 236 lb 9.6 oz (107.3 kg)     HPI   Prior to Admission medications   Medication Sig Start Date End Date Taking? Authorizing Provider  atenolol (TENORMIN) 50 MG tablet Take 1 tablet by mouth once daily 06/02/22  Yes Canary Fister, MBryn Mawr-[place] MD  Cyanocobalamin (VITAMIN B 12 PO) Take 5,000 mg by mouth every other day.   Yes [provider]  levothyroxine (SYNTHROID) 112 MCG tablet Take 1 tablet (112 mcg total) by mouth daily. 01/17/22  Yes KElayne Snare MD  losartan-hydrochlorothiazide ([facility] 50-12.5 MG tablet Take 1 tablet by mouth once daily 06/02/22  Yes Bernd Crom, MInes Bloomer MD  MAGNESIUM PO Take by mouth once a week.   Yes [provider]  metFORMIN (GLUCOPHAGE) 500 MG tablet TAKE 1 TABLET BY MOUTH TWICE DAILY WITH A MEAL 05/03/22  Yes Belinda Bringhurst, MInes Bloomer MD  psyllium (METAMUCIL) 58.6 % powder Take 1 packet by mouth as needed.   Yes [provider]  rosuvastatin (CRESTOR) 10 MG tablet Take 1 tablet (10 mg total) by mouth daily. 04/11/21  Yes Luca Dyar, MInes Bloomer MD  triamcinolone (NASACORT AQ) 55 MCG/ACT AERO nasal inhaler Place 2 sprays into the nose daily as needed. 06/09/18  Yes HPosey Boyer MD  tirzepatide ([facility] 5 MG/0.5ML Pen Inject 5 mg into the skin once a week. Patient not taking: Reported on 08/07/2022 05/02/22   SHorald Pollen MD    No Known Allergies  Patient Active Problem List   Diagnosis Date Noted   Dyslipidemia associated with type 2  diabetes mellitus ([place] 10/12/2021   Scleral lesion 10/12/2021   Morbid obesity ([place] 10/06/2020   Prediabetes 12/24/2019   Anemia 12/24/2019   Hypothyroidism, postradioiodine therapy 08/26/2014   Other postablative hypothyroidism 08/19/2013   Hypertension associated with diabetes ([facility] 08/19/2013    Past Medical History:  Diagnosis Date   Chronic constipation    uses metamucil   GERD (gastroesophageal reflux disease)    Hypertension    Thyroid disease     Past Surgical History:  Procedure Laterality Date   ABDOMINAL HYSTERECTOMY      Social History   Socioeconomic History   Marital status: Married    Spouse name: Not on file   Number of children: Not on file   Years of education: Not on file   Highest education level: Not on file  Occupational History   Not on file  Tobacco Use   Smoking status: Never   Smokeless tobacco: Current    Types: Snuff   Tobacco comments:    last used 10/26/19  Substance and Sexual Activity   Alcohol use: No    Alcohol/week: 0.0 standard drinks of alcohol   Drug use: No   Sexual activity: Yes  Other Topics Concern   Not on file  Social History Narrative   Not on file   Social Determinants of Health   Financial Resource Strain: Not on file  Food Insecurity: Not on file  Transportation Needs: Not on file  Physical Activity: Not on file  Stress: Not on file  Social Connections: Not on file  Intimate Partner Violence: Not on file    Family History  Problem Relation Age of Onset   Breast cancer Maternal Aunt    Ovarian cancer Maternal Aunt    Colon cancer Neg Hx    Colon polyps Neg Hx    Esophageal cancer Neg Hx    Rectal cancer Neg Hx    Stomach cancer Neg Hx      Review of Systems  Constitutional: Negative.  Negative for chills and fever.  HENT: Negative.  Negative for congestion and sore throat.   Respiratory: Negative.  Negative for cough and shortness of breath.   Cardiovascular: Negative.  Negative for chest  pain and palpitations.  Gastrointestinal: Negative.  Negative for abdominal pain, diarrhea, nausea and vomiting.  Genitourinary: Negative.  Negative for dysuria and hematuria.  Skin: Negative.  Negative for rash.  Neurological: Negative.  Negative for dizziness and headaches.  All other systems reviewed and are negative.   Today's Vitals   08/07/22 1532  BP: 134/76  Pulse: 70  Temp: 98.2 F (36.8 C)  TempSrc: Oral  SpO2: 97%  Weight: 244 lb (110.7 kg)  Height: 5' 4"$  (1.626 m)   Body mass index is 41.88 kg/m.  Physical Exam Vitals reviewed.  Constitutional:      Appearance: Normal appearance.  HENT:     Head: Normocephalic.  Eyes:     Extraocular Movements: Extraocular movements intact.     Pupils: Pupils are equal, round, and reactive to light.  Cardiovascular:     Rate and Rhythm: Normal rate and regular rhythm.     Pulses: Normal pulses.     Heart sounds: Normal heart sounds.  Pulmonary:     Effort: Pulmonary effort is normal.     Breath sounds: Normal breath sounds.  Musculoskeletal:     Cervical back: No tenderness.  Lymphadenopathy:     Cervical: No cervical adenopathy.  Skin:    General: Skin is warm and dry.  Neurological:     General: No focal deficit present.     Mental Status: She is alert and oriented to person, place, and time.  Psychiatric:        Mood and Affect: Mood normal.        Behavior: Behavior normal.     Results for orders placed or performed in visit on 08/07/22 (from the past 24 hour(s))  POCT HgB A1C     Status: Abnormal   Collection Time: 08/07/22  3:54 PM  Result Value Ref Range   Hemoglobin A1C 5.9 (A) 4.0 - 5.6 %   HbA1c POC (<> result, manual entry)     HbA1c, POC (prediabetic range)     HbA1c, POC (controlled diabetic range)      ASSESSMENT & PLAN: A total of 46 minutes was spent with the patient and counseling/coordination of care regarding preparing for this visit, review of most recent office visit notes, review of  most recent blood work results including interpretation of today's hemoglobin A1c, review of multiple chronic medical conditions under management, review of all medications and changes made, cardiovascular risks associated with diabetes and hypertension, education on nutrition, review of health maintenance items, prognosis, documentation, need for follow-up.  Problem List Items Addressed This Visit       Cardiovascular and Mediastinum   Hypertension associated with diabetes ([place]) - Primary    Much improved for  diabetes with hemoglobin A1c of 5.9. Well-controlled hypertension. Continue metformin 500 mg twice a day Continue Hyzaar 50-12.5 mg daily and atenolol 50 mg daily. Cardiovascular risk associated with diabetes and hypertension discussed. Diet and nutrition discussed.  Advised to decrease amount of daily carbohydrate intake and daily calories and increase amount of plant-based protein in her diet. Will still benefit from starting Mounjaro 5 mg weekly.      Relevant Medications   tirzepatide (MOUNJARO) 5 MG/0.5ML Pen   Other Relevant Orders   POCT HgB A1C (Completed)   Comprehensive metabolic panel   Urine Microalbumin w/creat. ratio     Endocrine   Hypothyroidism, postradioiodine therapy    Clinically euthyroid.  Continue Synthroid 112 mcg daily. Lab Results  Component Value Date   TSH 2.02 03/20/2022  TSH done today       Relevant Orders   TSH   Dyslipidemia associated with type 2 diabetes mellitus (HCC)    Stable chronic condition.  Diet and nutrition discussed.  Continue rosuvastatin 10 mg daily. Lipid profile done today.      Relevant Medications   tirzepatide (MOUNJARO) 5 MG/0.5ML Pen   Other Relevant Orders   Lipid panel     Other   Morbid obesity ([place])    Diet and nutrition discussed.  Advised to decrease amount of daily carbohydrate intake and daily calories and increase amount of plant-based protein in her diet. Continue metformin 500 mg twice a day and  start weekly Mounjaro 5 mg      Relevant Medications   tirzepatide (MOUNJARO) 5 MG/0.5ML Pen   Patient Instructions  Diabetes Mellitus and Nutrition, Adult When you have diabetes, or diabetes mellitus, it is very important to have healthy eating habits because your blood sugar (glucose) levels are greatly affected by what you eat and drink. Eating healthy foods in the right amounts, at about the same times every day, can help you: Manage your blood glucose. Lower your risk of heart disease. Improve your blood pressure. Reach or maintain a healthy weight. What can affect my meal plan? Every person with diabetes is different, and each person has different needs for a meal plan. Your health care provider may recommend that you work with a dietitian to make a meal plan that is best for you. Your meal plan may vary depending on factors such as: The calories you need. The medicines you take. Your weight. Your blood glucose, blood pressure, and cholesterol levels. Your activity level. Other health conditions you have, such as heart or kidney disease. How do carbohydrates affect me? Carbohydrates, also called carbs, affect your blood glucose level more than any other type of food. Eating carbs raises the amount of glucose in your blood. It is important to know how many carbs you can safely have in each meal. This is different for every person. Your dietitian can help you calculate how many carbs you should have at each meal and for each snack. How does alcohol affect me? Alcohol can cause a decrease in blood glucose (hypoglycemia), especially if you use insulin or take certain diabetes medicines by mouth. Hypoglycemia can be a life-threatening condition. Symptoms of hypoglycemia, such as sleepiness, dizziness, and confusion, are similar to symptoms of having too much alcohol. Do not drink alcohol if: Your health care provider tells you not to drink. You are pregnant, may be pregnant, or are  planning to become pregnant. If you drink alcohol: Limit how much you have to: 0-1 drink a day for women. 0-2 drinks  a day for men. Know how much alcohol is in your drink. In the [place] one drink equals one 12 oz bottle of beer (355 mL), one 5 oz glass of wine (148 mL), or one 1 oz glass of hard liquor (44 mL). Keep yourself hydrated with water, diet soda, or unsweetened iced tea. Keep in mind that regular soda, juice, and other mixers may contain a lot of sugar and must be counted as carbs. What are tips for following this plan?  Reading food labels Start by checking the serving size on the Nutrition Facts label of packaged foods and drinks. The number of calories and the amount of carbs, fats, and other nutrients listed on the label are based on one serving of the item. Many items contain more than one serving per package. Check the total grams (g) of carbs in one serving. Check the number of grams of saturated fats and trans fats in one serving. Choose foods that have a low amount or none of these fats. Check the number of milligrams (mg) of salt (sodium) in one serving. Most people should limit total sodium intake to less than 2,300 mg per day. Always check the nutrition information of foods labeled as "low-fat" or "nonfat." These foods may be higher in added sugar or refined carbs and should be avoided. Talk to your dietitian to identify your daily goals for nutrients listed on the label. Shopping Avoid buying canned, pre-made, or processed foods. These foods tend to be high in fat, sodium, and added sugar. Shop around the outside edge of the grocery store. This is where you will most often find fresh fruits and vegetables, bulk grains, fresh meats, and fresh dairy products. Cooking Use low-heat cooking methods, such as baking, instead of high-heat cooking methods, such as deep frying. Cook using healthy oils, such as olive, canola, or sunflower oil. Avoid cooking with butter, cream, or  high-fat meats. Meal planning Eat meals and snacks regularly, preferably at the same times every day. Avoid going long periods of time without eating. Eat foods that are high in fiber, such as fresh fruits, vegetables, beans, and whole grains. Eat 4-6 oz (112-168 g) of lean protein each day, such as lean meat, chicken, fish, eggs, or tofu. One ounce (oz) (28 g) of lean protein is equal to: 1 oz (28 g) of meat, chicken, or fish. 1 egg.  cup (62 g) of tofu. Eat some foods each day that contain healthy fats, such as avocado, nuts, seeds, and fish. What foods should I eat? Fruits Berries. Apples. Oranges. Peaches. Apricots. Plums. Grapes. Mangoes. Papayas. Pomegranates. Kiwi. Cherries. Vegetables Leafy greens, including lettuce, spinach, kale, chard, collard greens, mustard greens, and cabbage. Beets. Cauliflower. Broccoli. Carrots. Green beans. Tomatoes. Peppers. Onions. Cucumbers. Brussels sprouts. Grains Whole grains, such as whole-wheat or whole-grain bread, crackers, tortillas, cereal, and pasta. Unsweetened oatmeal. Quinoa. Brown or wild rice. Meats and other proteins Seafood. Poultry without skin. Lean cuts of poultry and beef. Tofu. Nuts. Seeds. Dairy Low-fat or fat-free dairy products such as milk, yogurt, and cheese. The items listed above may not be a complete list of foods and beverages you can eat and drink. Contact a dietitian for more information. What foods should I avoid? Fruits Fruits canned with syrup. Vegetables Canned vegetables. Frozen vegetables with butter or cream sauce. Grains Refined white flour and flour products such as bread, pasta, snack foods, and cereals. Avoid all processed foods. Meats and other proteins Fatty cuts of meat. Poultry with skin. Breaded or  fried meats. Processed meat. Avoid saturated fats. Dairy Full-fat yogurt, cheese, or milk. Beverages Sweetened drinks, such as soda or iced tea. The items listed above may not be a complete list of  foods and beverages you should avoid. Contact a dietitian for more information. Questions to ask a health care provider Do I need to meet with a certified diabetes care and education specialist? Do I need to meet with a dietitian? What number can I call if I have questions? When are the best times to check my blood glucose? Where to find more information: American Diabetes Association: diabetes.org Academy of Nutrition and Dietetics: eatright.[employer] of Diabetes and Digestive and Kidney Diseases: [employer]is Association of Diabetes Care & Education Specialists: diabeteseducator.org Summary It is important to have healthy eating habits because your blood sugar (glucose) levels are greatly affected by what you eat and drink. It is important to use alcohol carefully. A healthy meal plan will help you manage your blood glucose and lower your risk of heart disease. Your health care provider may recommend that you work with a dietitian to make a meal plan that is best for you. This information is not intended to replace advice given to you by your health care provider. Make sure you discuss any questions you have with your health care provider. Document Revised: 01/06/2020 Document Reviewed: 01/06/2020 Elsevier Patient Education  [place], MD [facility]

## 2022-08-07 NOTE — Assessment & Plan Note (Addendum)
Clinically euthyroid.  Continue Synthroid 112 mcg daily. Lab Results  Component Value Date   TSH 2.02 03/20/2022  TSH done today

## 2022-08-07 NOTE — Assessment & Plan Note (Signed)
Much improved for diabetes with hemoglobin A1c of 5.9. Well-controlled hypertension. Continue metformin 500 mg twice a day Continue Hyzaar 50-12.5 mg daily and atenolol 50 mg daily. Cardiovascular risk associated with diabetes and hypertension discussed. Diet and nutrition discussed.  Advised to decrease amount of daily carbohydrate intake and daily calories and increase amount of plant-based protein in her diet. Will still benefit from starting Mounjaro 5 mg weekly.

## 2022-08-07 NOTE — Assessment & Plan Note (Signed)
Diet and nutrition discussed.  Advised to decrease amount of daily carbohydrate intake and daily calories and increase amount of plant-based protein in her diet. Continue metformin 500 mg twice a day and start weekly Mounjaro 5 mg

## 2022-08-07 NOTE — Assessment & Plan Note (Signed)
Stable chronic condition.  Diet and nutrition discussed.  Continue rosuvastatin 10 mg daily. Lipid profile done today.

## 2022-08-08 ENCOUNTER — Other Ambulatory Visit: Payer: Self-pay | Admitting: Emergency Medicine

## 2022-08-08 DIAGNOSIS — R748 Abnormal levels of other serum enzymes: Secondary | ICD-10-CM

## 2022-08-08 NOTE — Progress Notes (Signed)
Thank you :)

## 2022-08-08 NOTE — Addendum Note (Signed)
Addended by: Rae Mar on: 08/08/2022 02:02 PM   Modules accepted: Orders

## 2022-08-12 LAB — MITOCHONDRIAL ANTIBODIES: Mitochondrial Ab: <20 Units (ref 0.0–20.0)

## 2022-08-29 ENCOUNTER — Other Ambulatory Visit: Payer: Self-pay | Admitting: Emergency Medicine

## 2022-08-29 DIAGNOSIS — I10 Essential (primary) hypertension: Secondary | ICD-10-CM

## 2022-09-01 ENCOUNTER — Other Ambulatory Visit: Payer: Self-pay | Admitting: Emergency Medicine

## 2022-09-01 DIAGNOSIS — I10 Essential (primary) hypertension: Secondary | ICD-10-CM

## 2022-09-25 ENCOUNTER — Other Ambulatory Visit (INDEPENDENT_AMBULATORY_CARE_PROVIDER_SITE_OTHER): Payer: 59

## 2022-09-25 DIAGNOSIS — E890 Postprocedural hypothyroidism: Secondary | ICD-10-CM

## 2022-09-26 LAB — T4, FREE: Free T4: 1.25 ng/dL (ref 0.60–1.60)

## 2022-09-27 ENCOUNTER — Encounter: Payer: Self-pay | Admitting: Endocrinology

## 2022-09-27 ENCOUNTER — Other Ambulatory Visit: Payer: Self-pay

## 2022-09-27 ENCOUNTER — Ambulatory Visit: Payer: 59 | Admitting: Endocrinology

## 2022-09-27 ENCOUNTER — Other Ambulatory Visit: Payer: Self-pay | Admitting: Endocrinology

## 2022-09-27 VITALS — BP 116/70 | HR 79 | Ht 64.0 in | Wt 232.0 lb

## 2022-09-27 DIAGNOSIS — E890 Postprocedural hypothyroidism: Secondary | ICD-10-CM

## 2022-09-27 LAB — TSH: TSH: 2 u[IU]/mL (ref 0.35–5.50)

## 2022-09-27 NOTE — Progress Notes (Signed)
Patient ID: Danielle Khan, female   DOB: 04/28/1971, 52 y.o.   MRN: [id number]    Reason for Appointment:  Hypothyroidism, followup visit    History of Present Illness:   The patient was first seen in consultation in 03/2012 for management of her hyperthyroidism. She had had Graves' disease since the year 2001 and had been on long-term methimazole treatment but had not been on any dictations for 2 years prior to evaluation. Because of persistent hyperthyroidism and a free T4 level of 1.65 she was given 16.75 mCi of I-131 on 05/02/12  When seen for followup in 3/15 she was significantly hypothyroid with symptoms of feeling less cold, tired and  some hair shedding.  She initially given 150 mcg on her first visit    However because of relatively low TSH in 6/15  her dose was reduced to 125 mcg but subsequently went back to 150 mcg  RECENT HISTORY:  She has been on variable doses, currently using generic levothyroxine, previously was on Euthyrox  Previously when she is hypothyroidism she usually is complaining of feeling cold and tired as well as having some hair loss  No complaints of unusual fatigue or cold intolerance Weight has been very well since her last visit  On her last visit because of low normal TSH she was told to take 6-1/2 tablets weekly of the 112 mcg levothyroxine  TSH which was 2.0 previously is the same now  Free T4 tends to fluctuate   Lab Results  Component Value Date   TSH 2.00 09/25/2022   TSH 2.02 08/07/2022   TSH 2.02 03/20/2022   FREET4 1.25 09/25/2022   FREET4 1.06 03/20/2022   FREET4 1.23 09/12/2021    Wt Readings from Last 3 Encounters:  09/27/22 232 lb (105.2 kg)  08/07/22 244 lb (110.7 kg)  05/02/22 240 lb (108.9 kg)     Allergies as of 09/27/2022   No Known Allergies      Medication List        Accurate as of September 27, 2022  3:15 PM. If you have any questions, ask your nurse or doctor.           atenolol 50 MG tablet Commonly known as: TENORMIN Take 1 tablet by mouth once daily   levothyroxine 112 MCG tablet Commonly known as: SYNTHROID Take 1 tablet (112 mcg total) by mouth daily.   losartan-hydrochlorothiazide 50-12.5 MG tablet Commonly known as: HYZAAR Take 1 tablet by mouth once daily   MAGNESIUM PO Take by mouth once a week.   metFORMIN 500 MG tablet Commonly known as: GLUCOPHAGE TAKE 1 TABLET BY MOUTH TWICE DAILY WITH A MEAL   psyllium 58.6 % powder Commonly known as: METAMUCIL Take 1 packet by mouth as needed.   rosuvastatin 10 MG tablet Commonly known as: Crestor Take 1 tablet (10 mg total) by mouth daily.   tirzepatide 5 MG/0.5ML Pen Commonly known as: MOUNJARO Inject 5 mg into the skin once a week.   triamcinolone 55 MCG/ACT Aero nasal inhaler Commonly known as: Nasacort AQ Place 2 sprays into the nose daily as needed.   VITAMIN B 12 PO Take 5,000 mg by mouth every other day.        Allergies: No Known Allergies  Past Medical History:  Diagnosis Date   Chronic constipation    uses metamucil   GERD (gastroesophageal reflux disease)    Hypertension    Thyroid disease     Past Surgical History:  Procedure Laterality Date   ABDOMINAL HYSTERECTOMY      Family History  Problem Relation Age of Onset   Breast cancer Maternal Aunt    Ovarian cancer Maternal Aunt    Colon cancer Neg Hx    Colon polyps Neg Hx    Esophageal cancer Neg Hx    Rectal cancer Neg Hx    Stomach cancer Neg Hx     Social History:  reports that she has never smoked. Her smokeless tobacco use includes snuff. She reports that she does not drink alcohol and does not use drugs.  REVIEW Of SYSTEMS:   Hypertension: She has had hypertension followed by her PCP   BP Readings from Last 3 Encounters:  09/27/22 116/70  08/07/22 134/76  05/02/22 136/88   She has mild diabetes on metformin and Mounjaro from her PCP   Lab Results  Component Value Date    HGBA1C 5.9 (A) 08/07/2022   HGBA1C 5.9 (A) 05/02/2022   HGBA1C 6.1 (A) 10/12/2021   Lab Results  Component Value Date   MICROALBUR 1.0 08/07/2022   LDLCALC 61 10/12/2021   CREATININE 0.96 08/07/2022     Examination:   BP 116/70 (BP Location: Left Arm, Patient Position: Sitting, Cuff Size: Large)   Pulse 79   Ht 5\' 4"  (1.626 m)   Wt 232 lb (105.2 kg)   SpO2 94%   BMI 39.82 kg/m       Assessment   Hypothyroidism, post ablative for several years  Her levothyroxine requirement has fluctuated between 112 and 150 mcg over the last couple of years  She is appearing to have a stable requirement for levothyroxine now Subjectively doing well  Now with 112 mcg levothyroxine taking 6-1/2 tablets/week her TSH is again normal at 2 Has been regular with her regimen    Treatment:  She will continue her dose of 6-1/2 tablets a week of the 112 mcg levothyroxine  Follow-up in 6 months as before   Reather Littler 09/27/2022, 3:15 PM

## 2022-10-17 ENCOUNTER — Other Ambulatory Visit: Payer: Self-pay | Admitting: Endocrinology

## 2022-10-17 DIAGNOSIS — E890 Postprocedural hypothyroidism: Secondary | ICD-10-CM

## 2022-11-02 ENCOUNTER — Telehealth: Payer: Self-pay | Admitting: Emergency Medicine

## 2022-11-02 ENCOUNTER — Other Ambulatory Visit: Payer: Self-pay | Admitting: *Deleted

## 2022-11-02 DIAGNOSIS — E1159 Type 2 diabetes mellitus with other circulatory complications: Secondary | ICD-10-CM

## 2022-11-02 MED ORDER — TIRZEPATIDE 5 MG/0.5ML ~~LOC~~ SOAJ
5.0000 mg | SUBCUTANEOUS | 3 refills | Status: DC
Start: 2022-11-02 — End: 2022-11-13

## 2022-11-02 NOTE — Telephone Encounter (Signed)
Patient states that her tirzepatide [facility]) 5 MG/0.5ML Pen is on backorder at [facility] and she would like it to be sent to the [facility] on [address]. Patient states that her injection is due tomorrow. Her call back number is [phone]

## 2022-11-02 NOTE — Telephone Encounter (Signed)
New medication sent to patient requested pharmacy 

## 2022-11-05 ENCOUNTER — Ambulatory Visit: Payer: 59 | Admitting: Emergency Medicine

## 2022-11-13 ENCOUNTER — Encounter: Payer: Self-pay | Admitting: Emergency Medicine

## 2022-11-13 ENCOUNTER — Ambulatory Visit: Payer: 59 | Admitting: Emergency Medicine

## 2022-11-13 VITALS — BP 118/72 | HR 82 | Temp 98.5°F | Ht 64.0 in | Wt 228.0 lb

## 2022-11-13 DIAGNOSIS — E1169 Type 2 diabetes mellitus with other specified complication: Secondary | ICD-10-CM

## 2022-11-13 DIAGNOSIS — E890 Postprocedural hypothyroidism: Secondary | ICD-10-CM

## 2022-11-13 DIAGNOSIS — E1159 Type 2 diabetes mellitus with other circulatory complications: Secondary | ICD-10-CM

## 2022-11-13 DIAGNOSIS — Z7984 Long term (current) use of oral hypoglycemic drugs: Secondary | ICD-10-CM

## 2022-11-13 DIAGNOSIS — E785 Hyperlipidemia, unspecified: Secondary | ICD-10-CM

## 2022-11-13 DIAGNOSIS — I152 Hypertension secondary to endocrine disorders: Secondary | ICD-10-CM | POA: Diagnosis not present

## 2022-11-13 DIAGNOSIS — Z7985 Long-term (current) use of injectable non-insulin antidiabetic drugs: Secondary | ICD-10-CM

## 2022-11-13 LAB — COMPREHENSIVE METABOLIC PANEL
ALT: 11 U/L (ref 0–35)
AST: 14 U/L (ref 0–37)
Albumin: 3.8 g/dL (ref 3.5–5.2)
Alkaline Phosphatase: 117 U/L (ref 39–117)
BUN: 14 mg/dL (ref 6–23)
CO2: 28 mEq/L (ref 19–32)
Calcium: 8.8 mg/dL (ref 8.4–10.5)
Chloride: 101 mEq/L (ref 96–112)
Creatinine, Ser: 0.89 mg/dL (ref 0.40–1.20)
GFR: 74.08 mL/min (ref >60.00)
Glucose, Bld: 117 mg/dL — ABNORMAL HIGH (ref 70–99)
Potassium: 3.2 mEq/L — ABNORMAL LOW (ref 3.5–5.1)
Sodium: 139 mEq/L (ref 135–145)
Total Bilirubin: 0.4 mg/dL (ref 0.2–1.2)
Total Protein: 7.3 g/dL (ref 6.0–8.3)

## 2022-11-13 LAB — LIPID PANEL
Cholesterol: 158 mg/dL (ref 0–200)
HDL: 40.5 mg/dL (ref >39.00)
LDL Cholesterol: 83 mg/dL (ref 0–99)
NonHDL: 117.92
Total CHOL/HDL Ratio: 4
Triglycerides: 176 mg/dL — ABNORMAL HIGH (ref 0.0–149.0)
VLDL: 35.2 mg/dL (ref 0.0–40.0)

## 2022-11-13 LAB — HEMOGLOBIN A1C: Hgb A1c MFr Bld: 5.8 % (ref 4.6–6.5)

## 2022-11-13 MED ORDER — TIRZEPATIDE 7.5 MG/0.5ML ~~LOC~~ SOAJ
7.5000 mg | SUBCUTANEOUS | 3 refills | Status: DC
Start: 2022-11-13 — End: 2023-02-28

## 2022-11-13 NOTE — Assessment & Plan Note (Signed)
BP Readings from Last 3 Encounters:  11/13/22 118/72  09/27/22 116/70  08/07/22 134/76   Lab Results  Component Value Date   HGBA1C 5.9 (A) 08/07/2022   Well-controlled hypertension. Continue Hyzaar 50-12.5 mg daily and atenolol 50 mg daily Well-controlled diabetes Continue metformin 500 mg twice a day and increase weekly Mounjaro to 7.5 mg Cardiovascular risks associated with hypertension and diabetes discussed Diet and nutrition discussed Follow-up in 3 months

## 2022-11-13 NOTE — Assessment & Plan Note (Signed)
Diet and nutrition discussed.  Advised to decrease amount of daily carbohydrate intake and daily calories and increase amount of plant-based protein in her diet. 

## 2022-11-13 NOTE — Assessment & Plan Note (Signed)
Chronic stable condition Continue rosuvastatin 10 mg daily The 10-year ASCVD risk score (Arnett DK, et al., 2019) is: 4.5%   Values used to calculate the score:     Age: 52 years     Sex: Female     Is Non-Hispanic African American: No     Diabetic: Yes     Tobacco smoker: No     Systolic Blood Pressure: 118 mmHg     Is BP treated: Yes     HDL Cholesterol: 44.5 mg/dL     Total Cholesterol: 200 mg/dL

## 2022-11-13 NOTE — Patient Instructions (Signed)

## 2022-11-13 NOTE — Assessment & Plan Note (Signed)
Clinically euthyroid TSH done today Continue Synthroid 112 mcg/day

## 2022-11-13 NOTE — Assessment & Plan Note (Signed)
Clinically euthyroid TSH level done today Continue Synthroid 112 mcg daily

## 2022-11-13 NOTE — Progress Notes (Signed)
Wt Readings from Last 3 Encounters:  11/13/22 228 lb (103.4 kg)  09/27/22 232 lb (105.2 kg)  08/07/22 244 lb (110.7 kg)   Danielle Khan Danielle Khan 52 y.o.   Chief Complaint  Patient presents with   Medical Management of Chronic Issues    3 month f/u. Pt states that she hasn't been able to get the [facility] 5MG  everywhere has been on back order and she wants to know what else can be done.     HISTORY OF PRESENT ILLNESS: This is a 52 y.o. female here for 30-month follow-up of chronic medical problems. Hypertension, dyslipidemia, and diabetes Off Mounjaro for 2 weeks due to backorder issues Overall doing well. No other complaints or medical concerns today.  HPI   Prior to Admission medications   Medication Sig Start Date End Date Taking? Authorizing Provider  atenolol (TENORMIN) 50 MG tablet Take 1 tablet by mouth once daily 08/29/22  Yes Nyx Keady, [place], MD  Cyanocobalamin (VITAMIN B 12 PO) Take 5,000 mg by mouth every other day.   Yes [provider]  levothyroxine (SYNTHROID) 112 MCG tablet Take 1 tablet by mouth once daily 10/17/22  Yes Reather Littler, MD  losartan-hydrochlorothiazide [facility]) 50-12.5 MG tablet Take 1 tablet by mouth once daily 09/01/22  Yes Myleah Cavendish, Eilleen Kempf, MD  MAGNESIUM PO Take by mouth once a week.   Yes [provider]  metFORMIN (GLUCOPHAGE) 500 MG tablet TAKE 1 TABLET BY MOUTH TWICE DAILY WITH A MEAL 05/03/22  Yes Meyer Arora, Eilleen Kempf, MD  psyllium (METAMUCIL) 58.6 % powder Take 1 packet by mouth as needed.   Yes [provider]  rosuvastatin (CRESTOR) 10 MG tablet Take 1 tablet (10 mg total) by mouth daily. 04/11/21  Yes Alexarae Oliva, Eilleen Kempf, MD  tirzepatide [facility]) 5 MG/0.5ML Pen Inject 5 mg into the skin once a week. 11/02/22  Yes Demri Poulton, Eilleen Kempf, MD  triamcinolone (NASACORT AQ) 55 MCG/ACT AERO nasal inhaler Place 2 sprays into the nose daily as needed. 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   Hypertension associated with diabetes (HCC) 08/19/2013    Past Medical History:  Diagnosis Date   Chronic constipation    uses metamucil   GERD (gastroesophageal reflux disease)    Hypertension    Thyroid disease     Past Surgical History:  Procedure Laterality Date   ABDOMINAL HYSTERECTOMY      Social History   Socioeconomic History   Marital status: Married    Spouse name: Not on file   Number of children: Not on file   Years of education: Not on file   Highest education level: Not on file  Occupational History   Not on file  Tobacco Use   Smoking status: Never   Smokeless tobacco: Current    Types: Snuff   Tobacco comments:    last used 10/26/19  Substance and Sexual Activity   Alcohol use: No    Alcohol/week: 0.0 standard drinks of alcohol   Drug use: No   Sexual activity: Yes  Other Topics Concern   Not on file  Social History Narrative   Not on file   Social Determinants  of Health   Financial Resource Strain: Not on file  Food Insecurity: Not on file  Transportation Needs: Not on file  Physical Activity: Not on file  Stress: Not on file  Social Connections: Not on file  Intimate Partner Violence: Not on file    Family History  Problem Relation Age of Onset   Breast cancer Maternal Aunt    Ovarian cancer Maternal Aunt    Colon cancer Neg Hx    Colon polyps Neg Hx    Esophageal cancer Neg Hx    Rectal cancer Neg Hx    Stomach cancer Neg Hx      Review of Systems  Constitutional: Negative.  Negative for chills and fever.  HENT: Negative.  Negative for congestion and sore throat.   Respiratory:  Negative.  Negative for cough and shortness of breath.   Cardiovascular: Negative.  Negative for chest pain and palpitations.  Gastrointestinal:  Negative for abdominal pain, diarrhea, nausea and vomiting.  Genitourinary: Negative.  Negative for dysuria and hematuria.  Skin: Negative.  Negative for rash.  Neurological: Negative.  Negative for dizziness and headaches.  All other systems reviewed and are negative.   Vitals:   11/13/22 1436  BP: 118/72  Pulse: 82  Temp: 98.5 F (36.9 C)  SpO2: 96%    Physical Exam Vitals reviewed.  Constitutional:      Appearance: Normal appearance. She is obese.  HENT:     Head: Normocephalic.  Eyes:     Extraocular Movements: Extraocular movements intact.  Cardiovascular:     Rate and Rhythm: Normal rate.  Pulmonary:     Effort: Pulmonary effort is normal.  Skin:    General: Skin is warm and dry.  Neurological:     Mental Status: She is alert and oriented to person, place, and time.  Psychiatric:        Mood and Affect: Mood normal.        Behavior: Behavior normal.      ASSESSMENT & PLAN: A total of 44 minutes was spent with the patient and counseling/coordination of care regarding preparing for this visit, review of most recent office visit notes, review of multiple chronic medical conditions and their management, review of all medications and changes made, cardiovascular risks associated with hypertension and diabetes, education on nutrition, prognosis, documentation, and need for follow-up.  Problem List Items Addressed This Visit       Cardiovascular and Mediastinum   Hypertension associated with diabetes (HCC) - Primary    BP Readings from Last 3 Encounters:  11/13/22 118/72  09/27/22 116/70  08/07/22 134/76   Lab Results  Component Value Date   HGBA1C 5.9 (A) 08/07/2022  Well-controlled hypertension. Continue Hyzaar 50-12.5 mg daily and atenolol 50 mg daily Well-controlled diabetes Continue metformin 500 mg twice a  day and increase weekly Mounjaro to 7.5 mg Cardiovascular risks associated with hypertension and diabetes discussed Diet and nutrition discussed Follow-up in 3 months      Relevant Medications   tirzepatide (MOUNJARO) 7.5 MG/0.5ML Pen   Other Relevant Orders   Hemoglobin A1c   Comprehensive metabolic panel   Lipid panel     Endocrine   Hypothyroidism, postradioiodine therapy    Clinically euthyroid TSH level done today Continue Synthroid 112 mcg daily      Relevant Orders   TSH   Dyslipidemia associated with type 2 diabetes mellitus (HCC)    Chronic stable condition Continue rosuvastatin 10 mg daily The 10-year ASCVD risk score (Arnett DK, et  al., 2019) is: 4.5%   Values used to calculate the score:     Age: 15 years     Sex: Female     Is Non-Hispanic African American: No     Diabetic: Yes     Tobacco smoker: No     Systolic Blood Pressure: 118 mmHg     Is BP treated: Yes     HDL Cholesterol: 44.5 mg/dL     Total Cholesterol: 200 mg/dL       Relevant Medications   tirzepatide (MOUNJARO) 7.5 MG/0.5ML Pen   Other Relevant Orders   Hemoglobin A1c   Comprehensive metabolic panel   Lipid panel     Other   Morbid obesity (HCC)    Diet and nutrition discussed Advised to decrease amount of daily carbohydrate intake and daily calories and increase amount of plant-based protein in her diet      Relevant Medications   tirzepatide (MOUNJARO) 7.5 MG/0.5ML Pen   Other Relevant Orders   Hemoglobin A1c   Comprehensive metabolic panel   Lipid panel   Patient Instructions  Diabetes Mellitus and Nutrition, Adult When you have diabetes, or diabetes mellitus, it is very important to have healthy eating habits because your blood sugar (glucose) levels are greatly affected by what you eat and drink. Eating healthy foods in the right amounts, at about the same times every day, can help you: Manage your blood glucose. Lower your risk of heart disease. Improve your blood  pressure. Reach or maintain a healthy weight. What can affect my meal plan? Every person with diabetes is different, and each person has different needs for a meal plan. Your health care provider may recommend that you work with a dietitian to make a meal plan that is best for you. Your meal plan may vary depending on factors such as: The calories you need. The medicines you take. Your weight. Your blood glucose, blood pressure, and cholesterol levels. Your activity level. Other health conditions you have, such as heart or kidney disease. How do carbohydrates affect me? Carbohydrates, also called carbs, affect your blood glucose level more than any other type of food. Eating carbs raises the amount of glucose in your blood. It is important to know how many carbs you can safely have in each meal. This is different for every person. Your dietitian can help you calculate how many carbs you should have at each meal and for each snack. How does alcohol affect me? Alcohol can cause a decrease in blood glucose (hypoglycemia), especially if you use insulin or take certain diabetes medicines by mouth. Hypoglycemia can be a life-threatening condition. Symptoms of hypoglycemia, such as sleepiness, dizziness, and confusion, are similar to symptoms of having too much alcohol. Do not drink alcohol if: Your health care provider tells you not to drink. You are pregnant, may be pregnant, or are planning to become pregnant. If you drink alcohol: Limit how much you have to: 0-1 drink a day for women. 0-2 drinks a day for men. Know how much alcohol is in your drink. In the [place] one drink equals one 12 oz bottle of beer (355 mL), one 5 oz glass of wine (148 mL), or one 1 oz glass of hard liquor (44 mL). Keep yourself hydrated with water, diet soda, or unsweetened iced tea. Keep in mind that regular soda, juice, and other mixers may contain a lot of sugar and must be counted as carbs. What are tips for following  this plan?  Reading food  labels Start by checking the serving size on the Nutrition Facts label of packaged foods and drinks. The number of calories and the amount of carbs, fats, and other nutrients listed on the label are based on one serving of the item. Many items contain more than one serving per package. Check the total grams (g) of carbs in one serving. Check the number of grams of saturated fats and trans fats in one serving. Choose foods that have a low amount or none of these fats. Check the number of milligrams (mg) of salt (sodium) in one serving. Most people should limit total sodium intake to less than 2,300 mg per day. Always check the nutrition information of foods labeled as "low-fat" or "nonfat." These foods may be higher in added sugar or refined carbs and should be avoided. Talk to your dietitian to identify your daily goals for nutrients listed on the label. Shopping Avoid buying canned, pre-made, or processed foods. These foods tend to be high in fat, sodium, and added sugar. Shop around the outside edge of the grocery store. This is where you will most often find fresh fruits and vegetables, bulk grains, fresh meats, and fresh dairy products. Cooking Use low-heat cooking methods, such as baking, instead of high-heat cooking methods, such as deep frying. Cook using healthy oils, such as olive, canola, or sunflower oil. Avoid cooking with butter, cream, or high-fat meats. Meal planning Eat meals and snacks regularly, preferably at the same times every day. Avoid going long periods of time without eating. Eat foods that are high in fiber, such as fresh fruits, vegetables, beans, and whole grains. Eat 4-6 oz (112-168 g) of lean protein each day, such as lean meat, chicken, fish, eggs, or tofu. One ounce (oz) (28 g) of lean protein is equal to: 1 oz (28 g) of meat, chicken, or fish. 1 egg.  cup (62 g) of tofu. Eat some foods each day that contain healthy fats, such as  avocado, nuts, seeds, and fish. What foods should I eat? Fruits Berries. Apples. Oranges. Peaches. Apricots. Plums. Grapes. Mangoes. Papayas. Pomegranates. Kiwi. Cherries. Vegetables Leafy greens, including lettuce, spinach, kale, chard, collard greens, mustard greens, and cabbage. Beets. Cauliflower. Broccoli. Carrots. Green beans. Tomatoes. Peppers. Onions. Cucumbers. Brussels sprouts. Grains Whole grains, such as whole-wheat or whole-grain bread, crackers, tortillas, cereal, and pasta. Unsweetened oatmeal. Quinoa. Brown or wild rice. Meats and other proteins Seafood. Poultry without skin. Lean cuts of poultry and beef. Tofu. Nuts. Seeds. Dairy Low-fat or fat-free dairy products such as milk, yogurt, and cheese. The items listed above may not be a complete list of foods and beverages you can eat and drink. Contact a dietitian for more information. What foods should I avoid? Fruits Fruits canned with syrup. Vegetables Canned vegetables. Frozen vegetables with butter or cream sauce. Grains Refined white flour and flour products such as bread, pasta, snack foods, and cereals. Avoid all processed foods. Meats and other proteins Fatty cuts of meat. Poultry with skin. Breaded or fried meats. Processed meat. Avoid saturated fats. Dairy Full-fat yogurt, cheese, or milk. Beverages Sweetened drinks, such as soda or iced tea. The items listed above may not be a complete list of foods and beverages you should avoid. Contact a dietitian for more information. Questions to ask a health care provider Do I need to meet with a certified diabetes care and education specialist? Do I need to meet with a dietitian? What number can I call if I have questions? When are the best times  to check my blood glucose? Where to find more information: American Diabetes Association: diabetes.org Academy of Nutrition and Dietetics: eatright.[employer] of Diabetes and Digestive and Kidney Diseases:  StageSync.si Association of Diabetes Care & Education Specialists: diabeteseducator.org Summary It is important to have healthy eating habits because your blood sugar (glucose) levels are greatly affected by what you eat and drink. It is important to use alcohol carefully. A healthy meal plan will help you manage your blood glucose and lower your risk of heart disease. Your health care provider may recommend that you work with a dietitian to make a meal plan that is best for you. This information is not intended to replace advice given to you by your health care provider. Make sure you discuss any questions you have with your health care provider. Document Revised: 01/06/2020 Document Reviewed: 01/06/2020 Elsevier Patient Education  2024 Elsevier Inc.    Edwina Barth, MD [facility]

## 2022-11-28 ENCOUNTER — Other Ambulatory Visit: Payer: Self-pay | Admitting: Emergency Medicine

## 2022-11-28 DIAGNOSIS — I10 Essential (primary) hypertension: Secondary | ICD-10-CM

## 2022-12-04 ENCOUNTER — Other Ambulatory Visit: Payer: Self-pay | Admitting: Emergency Medicine

## 2022-12-04 DIAGNOSIS — I10 Essential (primary) hypertension: Secondary | ICD-10-CM

## 2023-01-16 ENCOUNTER — Encounter (INDEPENDENT_AMBULATORY_CARE_PROVIDER_SITE_OTHER): Payer: Self-pay

## 2023-02-13 ENCOUNTER — Ambulatory Visit: Payer: 59 | Admitting: Emergency Medicine

## 2023-02-13 ENCOUNTER — Encounter: Payer: Self-pay | Admitting: Emergency Medicine

## 2023-02-13 VITALS — BP 126/88 | HR 80 | Temp 98.7°F | Ht 64.0 in | Wt 217.5 lb

## 2023-02-13 DIAGNOSIS — I152 Hypertension secondary to endocrine disorders: Secondary | ICD-10-CM

## 2023-02-13 DIAGNOSIS — E1159 Type 2 diabetes mellitus with other circulatory complications: Secondary | ICD-10-CM | POA: Diagnosis not present

## 2023-02-13 DIAGNOSIS — E890 Postprocedural hypothyroidism: Secondary | ICD-10-CM | POA: Diagnosis not present

## 2023-02-13 DIAGNOSIS — Z23 Encounter for immunization: Secondary | ICD-10-CM

## 2023-02-13 DIAGNOSIS — E785 Hyperlipidemia, unspecified: Secondary | ICD-10-CM

## 2023-02-13 DIAGNOSIS — E1169 Type 2 diabetes mellitus with other specified complication: Secondary | ICD-10-CM | POA: Diagnosis not present

## 2023-02-13 NOTE — Progress Notes (Signed)
Danielle Khan Danielle Khan 52 y.o.   Chief Complaint  Patient presents with   Medical Management of Chronic Issues    f/u appt, no concerns     HISTORY OF PRESENT ILLNESS: This is a 52 y.o. female here for 81-month follow-up of diabetes Eating better and losing weight.  Overall feels much better Has no complaints or medical concerns today. Lab Results  Component Value Date   HGBA1C 5.8 11/13/2022   Wt Readings from Last 3 Encounters:  02/13/23 217 lb 8 oz (98.7 kg)  11/13/22 228 lb (103.4 kg)  09/27/22 232 lb (105.2 kg)     HPI   Prior to Admission medications   Medication Sig Start Date End Date Taking? Authorizing Provider  atenolol (TENORMIN) 50 MG tablet Take 1 tablet by mouth once daily 11/28/22  Yes Jasslyn Finkel, [place], MD  Cyanocobalamin (VITAMIN B 12 PO) Take 5,000 mg by mouth every other day.   Yes [provider]  levothyroxine (SYNTHROID) 112 MCG tablet Take 1 tablet by mouth once daily 10/17/22  Yes Reather Littler, MD  losartan-hydrochlorothiazide [facility]) 50-12.5 MG tablet Take 1 tablet by mouth once daily 12/04/22  Yes Deardra Hinkley, Eilleen Kempf, MD  MAGNESIUM PO Take by mouth once a week.   Yes [provider]  metFORMIN (GLUCOPHAGE) 500 MG tablet TAKE 1 TABLET BY MOUTH TWICE DAILY WITH A MEAL 05/03/22  Yes Varun Jourdan, Eilleen Kempf, MD  psyllium (METAMUCIL) 58.6 % powder Take 1 packet by mouth as needed.   Yes [provider]  rosuvastatin (CRESTOR) 10 MG tablet Take 1 tablet (10 mg total) by mouth daily. 04/11/21  Yes Kree Armato, Eilleen Kempf, MD  tirzepatide [facility]) 7.5 MG/0.5ML Pen Inject 7.5 mg into the skin once a week. 11/13/22  Yes Ranferi Clingan, Eilleen Kempf, MD  triamcinolone (NASACORT AQ) 55 MCG/ACT AERO nasal inhaler Place 2 sprays into the nose daily as needed. 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   Hypertension associated with diabetes (HCC) 08/19/2013    Past Medical History:  Diagnosis Date   Chronic constipation    uses metamucil   GERD (gastroesophageal reflux disease)    Hypertension    Thyroid disease     Past Surgical History:  Procedure Laterality Date   ABDOMINAL HYSTERECTOMY      Social History   Socioeconomic History   Marital status: Married    Spouse name: Not on file   Number of children: Not on file   Years of education: Not on file   Highest education level: Not on file  Occupational History   Not on file  Tobacco Use   Smoking status: Never   Smokeless tobacco: Current    Types: Snuff   Tobacco comments:    last used 10/26/19  Substance and Sexual Activity   Alcohol use: No    Alcohol/week: 0.0 standard drinks of alcohol   Drug use: No   Sexual activity: Yes  Other Topics Concern   Not on file  Social History Narrative   Not on file   Social Determinants of Health   Financial Resource Strain: Not on file  Food Insecurity: Not on file  Transportation  Needs: Not on file  Physical Activity: Not on file  Stress: Not on file  Social Connections: Not on file  Intimate Partner Violence: Not on file    Family History  Problem Relation Age of Onset   Breast cancer Maternal Aunt    Ovarian cancer Maternal Aunt    Colon cancer Neg Hx    Colon polyps Neg Hx    Esophageal cancer Neg Hx    Rectal cancer Neg Hx    Stomach cancer Neg Hx      Review of Systems  Constitutional: Negative.  Negative for chills and fever.  HENT: Negative.  Negative for congestion and sore throat.   Respiratory: Negative.  Negative for cough and shortness of breath.   Cardiovascular: Negative.  Negative for chest pain and palpitations.   Gastrointestinal:  Negative for abdominal pain, diarrhea, nausea and vomiting.  Genitourinary: Negative.  Negative for dysuria and hematuria.  Skin: Negative.  Negative for rash.  Neurological: Negative.  Negative for dizziness and headaches.  All other systems reviewed and are negative.   Vitals:   02/13/23 1548  BP: 126/88  Pulse: 80  Temp: 98.7 F (37.1 C)  SpO2: 95%    Physical Exam Vitals reviewed.  Constitutional:      Appearance: Normal appearance.  HENT:     Head: Normocephalic.     Mouth/Throat:     Mouth: Mucous membranes are moist.     Pharynx: Oropharynx is clear.  Eyes:     Extraocular Movements: Extraocular movements intact.     Conjunctiva/sclera: Conjunctivae normal.     Pupils: Pupils are equal, round, and reactive to light.  Cardiovascular:     Rate and Rhythm: Normal rate and regular rhythm.     Pulses: Normal pulses.     Heart sounds: Normal heart sounds.  Pulmonary:     Effort: Pulmonary effort is normal.     Breath sounds: Normal breath sounds.  Abdominal:     Palpations: Abdomen is soft.     Tenderness: There is no abdominal tenderness.  Musculoskeletal:     Cervical back: No tenderness.  Lymphadenopathy:     Cervical: No cervical adenopathy.  Skin:    General: Skin is warm and dry.     Capillary Refill: Capillary refill takes less than 2 seconds.  Neurological:     General: No focal deficit present.     Mental Status: She is alert and oriented to person, place, and time.  Psychiatric:        Mood and Affect: Mood normal.        Behavior: Behavior normal.      ASSESSMENT & PLAN: A total of 45 minutes was spent with the patient and counseling/coordination of care regarding preparing for this visit, review of most recent office visit notes, review of multiple chronic medical conditions under management, review of most recent blood work results, review of all medications, cardiovascular risks associated with hypertension and diabetes,  education on nutrition, prognosis, documentation and need for follow-up.  Problem List Items Addressed This Visit       Cardiovascular and Mediastinum   Hypertension associated with diabetes (HCC) - Primary    BP Readings from Last 3 Encounters:  02/13/23 126/88  11/13/22 118/72  09/27/22 116/70  Well-controlled hypertension Continue atenolol 50 mg daily and Hyzaar 50-12.5 mg daily Well-controlled diabetes Continue weekly Mounjaro 7.5 mg and metformin 500 mg twice a day Cardiovascular risk associated with diabetes and hypertension discussed Diet and nutrition discussed Blood  work done today Follow-up in 3 months       Relevant Orders   Hemoglobin A1c   Comprehensive metabolic panel     Endocrine   Hypothyroidism, postradioiodine therapy    Clinically euthyroid Continue levothyroxine 112 mcg daily Lab Results  Component Value Date   TSH 2.00 09/25/2022         Dyslipidemia associated with type 2 diabetes mellitus (HCC)    Chronic stable condition Diet and nutrition discussed Continue rosuvastatin 10 mg daily The 10-year ASCVD risk score (Arnett DK, et al., 2019) is: 4.3%   Values used to calculate the score:     Age: 43 years     Sex: Female     Is Non-Hispanic African American: No     Diabetic: Yes     Tobacco smoker: No     Systolic Blood Pressure: 126 mmHg     Is BP treated: Yes     HDL Cholesterol: 40.5 mg/dL     Total Cholesterol: 158 mg/dL         Other   Morbid obesity (HCC)    Wt Readings from Last 3 Encounters:  02/13/23 217 lb 8 oz (98.7 kg)  11/13/22 228 lb (103.4 kg)  09/27/22 232 lb (105.2 kg)  Eating better and losing weight. Successfully losing weight Diet and nutrition discussed Continue Mounjaro and metformin same doses.  No changes.       Other Visit Diagnoses     Need for vaccination       Relevant Orders   Flu vaccine trivalent PF, 6mos and older(Flulaval,Afluria,Fluarix,Fluzone) (Completed)      Patient Instructions   Diabetes Mellitus and Nutrition, Adult When you have diabetes, or diabetes mellitus, it is very important to have healthy eating habits because your blood sugar (glucose) levels are greatly affected by what you eat and drink. Eating healthy foods in the right amounts, at about the same times every day, can help you: Manage your blood glucose. Lower your risk of heart disease. Improve your blood pressure. Reach or maintain a healthy weight. What can affect my meal plan? Every person with diabetes is different, and each person has different needs for a meal plan. Your health care provider may recommend that you work with a dietitian to make a meal plan that is best for you. Your meal plan may vary depending on factors such as: The calories you need. The medicines you take. Your weight. Your blood glucose, blood pressure, and cholesterol levels. Your activity level. Other health conditions you have, such as heart or kidney disease. How do carbohydrates affect me? Carbohydrates, also called carbs, affect your blood glucose level more than any other type of food. Eating carbs raises the amount of glucose in your blood. It is important to know how many carbs you can safely have in each meal. This is different for every person. Your dietitian can help you calculate how many carbs you should have at each meal and for each snack. How does alcohol affect me? Alcohol can cause a decrease in blood glucose (hypoglycemia), especially if you use insulin or take certain diabetes medicines by mouth. Hypoglycemia can be a life-threatening condition. Symptoms of hypoglycemia, such as sleepiness, dizziness, and confusion, are similar to symptoms of having too much alcohol. Do not drink alcohol if: Your health care provider tells you not to drink. You are pregnant, may be pregnant, or are planning to become pregnant. If you drink alcohol: Limit how much you have to: 0-1 drink  a day for women. 0-2 drinks a day  for men. Know how much alcohol is in your drink. In the [place] one drink equals one 12 oz bottle of beer (355 mL), one 5 oz glass of wine (148 mL), or one 1 oz glass of hard liquor (44 mL). Keep yourself hydrated with water, diet soda, or unsweetened iced tea. Keep in mind that regular soda, juice, and other mixers may contain a lot of sugar and must be counted as carbs. What are tips for following this plan?  Reading food labels Start by checking the serving size on the Nutrition Facts label of packaged foods and drinks. The number of calories and the amount of carbs, fats, and other nutrients listed on the label are based on one serving of the item. Many items contain more than one serving per package. Check the total grams (g) of carbs in one serving. Check the number of grams of saturated fats and trans fats in one serving. Choose foods that have a low amount or none of these fats. Check the number of milligrams (mg) of salt (sodium) in one serving. Most people should limit total sodium intake to less than 2,300 mg per day. Always check the nutrition information of foods labeled as "low-fat" or "nonfat." These foods may be higher in added sugar or refined carbs and should be avoided. Talk to your dietitian to identify your daily goals for nutrients listed on the label. Shopping Avoid buying canned, pre-made, or processed foods. These foods tend to be high in fat, sodium, and added sugar. Shop around the outside edge of the grocery store. This is where you will most often find fresh fruits and vegetables, bulk grains, fresh meats, and fresh dairy products. Cooking Use low-heat cooking methods, such as baking, instead of high-heat cooking methods, such as deep frying. Cook using healthy oils, such as olive, canola, or sunflower oil. Avoid cooking with butter, cream, or high-fat meats. Meal planning Eat meals and snacks regularly, preferably at the same times every day. Avoid going long periods  of time without eating. Eat foods that are high in fiber, such as fresh fruits, vegetables, beans, and whole grains. Eat 4-6 oz (112-168 g) of lean protein each day, such as lean meat, chicken, fish, eggs, or tofu. One ounce (oz) (28 g) of lean protein is equal to: 1 oz (28 g) of meat, chicken, or fish. 1 egg.  cup (62 g) of tofu. Eat some foods each day that contain healthy fats, such as avocado, nuts, seeds, and fish. What foods should I eat? Fruits Berries. Apples. Oranges. Peaches. Apricots. Plums. Grapes. Mangoes. Papayas. Pomegranates. Kiwi. Cherries. Vegetables Leafy greens, including lettuce, spinach, kale, chard, collard greens, mustard greens, and cabbage. Beets. Cauliflower. Broccoli. Carrots. Green beans. Tomatoes. Peppers. Onions. Cucumbers. Brussels sprouts. Grains Whole grains, such as whole-wheat or whole-grain bread, crackers, tortillas, cereal, and pasta. Unsweetened oatmeal. Quinoa. Brown or wild rice. Meats and other proteins Seafood. Poultry without skin. Lean cuts of poultry and beef. Tofu. Nuts. Seeds. Dairy Low-fat or fat-free dairy products such as milk, yogurt, and cheese. The items listed above may not be a complete list of foods and beverages you can eat and drink. Contact a dietitian for more information. What foods should I avoid? Fruits Fruits canned with syrup. Vegetables Canned vegetables. Frozen vegetables with butter or cream sauce. Grains Refined white flour and flour products such as bread, pasta, snack foods, and cereals. Avoid all processed foods. Meats and other proteins Fatty cuts  of meat. Poultry with skin. Breaded or fried meats. Processed meat. Avoid saturated fats. Dairy Full-fat yogurt, cheese, or milk. Beverages Sweetened drinks, such as soda or iced tea. The items listed above may not be a complete list of foods and beverages you should avoid. Contact a dietitian for more information. Questions to ask a health care provider Do I need  to meet with a certified diabetes care and education specialist? Do I need to meet with a dietitian? What number can I call if I have questions? When are the best times to check my blood glucose? Where to find more information: American Diabetes Association: diabetes.org Academy of Nutrition and Dietetics: eatright.[employer] of Diabetes and Digestive and Kidney Diseases: StageSync.si Association of Diabetes Care & Education Specialists: diabeteseducator.org Summary It is important to have healthy eating habits because your blood sugar (glucose) levels are greatly affected by what you eat and drink. It is important to use alcohol carefully. A healthy meal plan will help you manage your blood glucose and lower your risk of heart disease. Your health care provider may recommend that you work with a dietitian to make a meal plan that is best for you. This information is not intended to replace advice given to you by your health care provider. Make sure you discuss any questions you have with your health care provider. Document Revised: 01/06/2020 Document Reviewed: 01/06/2020 Elsevier Patient Education  2024 Elsevier Inc.     Danielle Barth, MD [facility]

## 2023-02-13 NOTE — Assessment & Plan Note (Signed)
BP Readings from Last 3 Encounters:  02/13/23 126/88  11/13/22 118/72  09/27/22 116/70  Well-controlled hypertension Continue atenolol 50 mg daily and Hyzaar 50-12.5 mg daily Well-controlled diabetes Continue weekly Mounjaro 7.5 mg and metformin 500 mg twice a day Cardiovascular risk associated with diabetes and hypertension discussed Diet and nutrition discussed Blood work done today Follow-up in 3 months

## 2023-02-13 NOTE — Assessment & Plan Note (Addendum)
Clinically euthyroid Continue levothyroxine 112 mcg daily Lab Results  Component Value Date   TSH 2.00 09/25/2022

## 2023-02-13 NOTE — Patient Instructions (Signed)

## 2023-02-13 NOTE — Assessment & Plan Note (Signed)
Wt Readings from Last 3 Encounters:  02/13/23 217 lb 8 oz (98.7 kg)  11/13/22 228 lb (103.4 kg)  09/27/22 232 lb (105.2 kg)  Eating better and losing weight. Successfully losing weight Diet and nutrition discussed Continue Mounjaro and metformin same doses.  No changes.

## 2023-02-13 NOTE — Assessment & Plan Note (Signed)
Chronic stable condition Diet and nutrition discussed Continue rosuvastatin 10 mg daily The 10-year ASCVD risk score (Arnett DK, et al., 2019) is: 4.3%   Values used to calculate the score:     Age: 52 years     Sex: Female     Is Non-Hispanic African American: No     Diabetic: Yes     Tobacco smoker: No     Systolic Blood Pressure: 126 mmHg     Is BP treated: Yes     HDL Cholesterol: 40.5 mg/dL     Total Cholesterol: 158 mg/dL

## 2023-02-14 LAB — COMPREHENSIVE METABOLIC PANEL
ALT: 9 U/L (ref 0–35)
AST: 14 U/L (ref 0–37)
Albumin: 3.8 g/dL (ref 3.5–5.2)
Alkaline Phosphatase: 119 U/L — ABNORMAL HIGH (ref 39–117)
BUN: 12 mg/dL (ref 6–23)
CO2: 30 meq/L (ref 19–32)
Calcium: 9 mg/dL (ref 8.4–10.5)
Chloride: 103 meq/L (ref 96–112)
Creatinine, Ser: 0.96 mg/dL (ref 0.40–1.20)
GFR: 67.53 mL/min (ref >60.00)
Glucose, Bld: 72 mg/dL (ref 70–99)
Potassium: 3.3 meq/L — ABNORMAL LOW (ref 3.5–5.1)
Sodium: 141 meq/L (ref 135–145)
Total Bilirubin: 0.5 mg/dL (ref 0.2–1.2)
Total Protein: 7.4 g/dL (ref 6.0–8.3)

## 2023-02-14 LAB — HEMOGLOBIN A1C: Hgb A1c MFr Bld: 5.7 % (ref 4.6–6.5)

## 2023-02-28 ENCOUNTER — Other Ambulatory Visit: Payer: Self-pay | Admitting: Emergency Medicine

## 2023-02-28 DIAGNOSIS — E1159 Type 2 diabetes mellitus with other circulatory complications: Secondary | ICD-10-CM

## 2023-02-28 DIAGNOSIS — E1169 Type 2 diabetes mellitus with other specified complication: Secondary | ICD-10-CM

## 2023-03-01 ENCOUNTER — Other Ambulatory Visit: Payer: Self-pay | Admitting: Emergency Medicine

## 2023-03-01 DIAGNOSIS — I10 Essential (primary) hypertension: Secondary | ICD-10-CM

## 2023-04-10 LAB — HM MAMMOGRAPHY

## 2023-04-11 ENCOUNTER — Encounter: Payer: Self-pay | Admitting: Emergency Medicine

## 2023-04-19 ENCOUNTER — Other Ambulatory Visit: Payer: Self-pay | Admitting: Emergency Medicine

## 2023-04-19 DIAGNOSIS — E1159 Type 2 diabetes mellitus with other circulatory complications: Secondary | ICD-10-CM

## 2023-04-19 DIAGNOSIS — E1169 Type 2 diabetes mellitus with other specified complication: Secondary | ICD-10-CM

## 2023-04-19 DIAGNOSIS — I152 Hypertension secondary to endocrine disorders: Secondary | ICD-10-CM

## 2023-05-15 ENCOUNTER — Ambulatory Visit: Payer: 59 | Admitting: Emergency Medicine

## 2023-05-27 ENCOUNTER — Other Ambulatory Visit: Payer: Self-pay | Admitting: Emergency Medicine

## 2023-05-27 DIAGNOSIS — E1165 Type 2 diabetes mellitus with hyperglycemia: Secondary | ICD-10-CM

## 2023-05-27 DIAGNOSIS — I10 Essential (primary) hypertension: Secondary | ICD-10-CM

## 2023-05-28 ENCOUNTER — Ambulatory Visit: Payer: 59 | Admitting: Emergency Medicine

## 2023-05-28 ENCOUNTER — Encounter: Payer: Self-pay | Admitting: Emergency Medicine

## 2023-05-28 VITALS — BP 114/78 | HR 80 | Temp 98.1°F | Ht 64.0 in | Wt 215.0 lb

## 2023-05-28 DIAGNOSIS — E785 Hyperlipidemia, unspecified: Secondary | ICD-10-CM | POA: Diagnosis not present

## 2023-05-28 DIAGNOSIS — I152 Hypertension secondary to endocrine disorders: Secondary | ICD-10-CM | POA: Diagnosis not present

## 2023-05-28 DIAGNOSIS — Z7985 Long-term (current) use of injectable non-insulin antidiabetic drugs: Secondary | ICD-10-CM

## 2023-05-28 DIAGNOSIS — E1159 Type 2 diabetes mellitus with other circulatory complications: Secondary | ICD-10-CM

## 2023-05-28 DIAGNOSIS — E890 Postprocedural hypothyroidism: Secondary | ICD-10-CM | POA: Diagnosis not present

## 2023-05-28 DIAGNOSIS — E1169 Type 2 diabetes mellitus with other specified complication: Secondary | ICD-10-CM | POA: Diagnosis not present

## 2023-05-28 DIAGNOSIS — Z7984 Long term (current) use of oral hypoglycemic drugs: Secondary | ICD-10-CM

## 2023-05-28 LAB — POCT GLYCOSYLATED HEMOGLOBIN (HGB A1C): Hemoglobin A1C: 5.4 % (ref 4.0–5.6)

## 2023-05-28 NOTE — Assessment & Plan Note (Signed)
Chronic stable condition Diet and nutrition discussed Continue rosuvastatin 10 mg daily

## 2023-05-28 NOTE — Patient Instructions (Signed)

## 2023-05-28 NOTE — Assessment & Plan Note (Signed)
Eating better and losing weight. Successfully losing weight Diet and nutrition discussed Continue Mounjaro and metformin same doses.  No changes

## 2023-05-28 NOTE — Progress Notes (Signed)
Danielle Khan [name] 52 y.o.   Chief Complaint  Patient presents with   Follow-up    3 month f/u for DM     HISTORY OF PRESENT ILLNESS: This is a 52 y.o. female A1A here for 2-month follow-up of diabetes Overall doing well.  Has no complaints or medical concerns today. Lab Results  Component Value Date   HGBA1C 5.7 02/13/2023   Wt Readings from Last 3 Encounters:  05/28/23 215 lb (97.5 kg)  02/13/23 217 lb 8 oz (98.7 kg)  11/13/22 228 lb (103.4 kg)   BP Readings from Last 3 Encounters:  05/28/23 114/78  02/13/23 126/88  11/13/22 118/72     HPI   Prior to Admission medications   Medication Sig Start Date End Date Taking? Authorizing Provider  atenolol (TENORMIN) 50 MG tablet Take 1 tablet by mouth once daily 05/27/23  Yes Raymond Azure, [place], MD  Cyanocobalamin (VITAMIN B 12 PO) Take 5,000 mg by mouth every other day.   Yes [provider]  levothyroxine (SYNTHROID) 112 MCG tablet Take 1 tablet by mouth once daily 10/17/22  Yes Reather Littler, MD  losartan-hydrochlorothiazide [facility]) 50-12.5 MG tablet Take 1 tablet by mouth once daily 05/27/23  Yes Suriah Peragine, Eilleen Kempf, MD  MAGNESIUM PO Take by mouth once a week.   Yes [provider]  metFORMIN (GLUCOPHAGE) 500 MG tablet TAKE 1 TABLET BY MOUTH TWICE DAILY WITH A MEAL 05/27/23  Yes Manuela Halbur, [place], MD  MOUNJARO 7.5 MG/0.5ML Pen INJECT 1/2 (ONE-HALF) ML  (7.5 MG) ONCE A WEEK 04/19/23  Yes Georgina Quint, MD  psyllium (METAMUCIL) 58.6 % powder Take 1 packet by mouth as needed.   Yes [provider]  rosuvastatin (CRESTOR) 10 MG tablet Take 1 tablet (10 mg total) by mouth daily. 04/11/21  Yes Seith Aikey, Eilleen Kempf, MD  triamcinolone (NASACORT AQ) 55 MCG/ACT AERO nasal inhaler Place 2 sprays into the nose daily as needed. 06/09/18  Yes Peyton Najjar, MD    No Known Allergies  Patient Active Problem List   Diagnosis Date Noted   Dyslipidemia associated with type 2 diabetes  mellitus (HCC) 10/12/2021   Scleral lesion 10/12/2021   Morbid obesity (HCC) 10/06/2020   Anemia 12/24/2019   Hypothyroidism, postradioiodine therapy 08/26/2014   Other postablative hypothyroidism 08/19/2013   Hypertension associated with diabetes (HCC) 08/19/2013    Past Medical History:  Diagnosis Date   Chronic constipation    uses metamucil   GERD (gastroesophageal reflux disease)    Hypertension    Thyroid disease     Past Surgical History:  Procedure Laterality Date   ABDOMINAL HYSTERECTOMY      Social History   Socioeconomic History   Marital status: Married    Spouse name: Not on file   Number of children: Not on file   Years of education: Not on file   Highest education level: Not on file  Occupational History   Not on file  Tobacco Use   Smoking status: Never   Smokeless tobacco: Current    Types: Snuff   Tobacco comments:    last used 10/26/19  Substance and Sexual Activity   Alcohol use: No    Alcohol/week: 0.0 standard drinks of alcohol   Drug use: No   Sexual activity: Yes  Other Topics Concern   Not on file  Social History Narrative   Not on file   Social Determinants of Health   Financial Resource Strain: Not on file  Food Insecurity: Not  on file  Transportation Needs: Not on file  Physical Activity: Not on file  Stress: Not on file  Social Connections: Not on file  Intimate Partner Violence: Not on file    Family History  Problem Relation Age of Onset   Breast cancer Maternal Aunt    Ovarian cancer Maternal Aunt    Colon cancer Neg Hx    Colon polyps Neg Hx    Esophageal cancer Neg Hx    Rectal cancer Neg Hx    Stomach cancer Neg Hx      Review of Systems  Constitutional: Negative.  Negative for chills and fever.  HENT: Negative.  Negative for congestion and sore throat.   Respiratory: Negative.  Negative for cough and shortness of breath.   Cardiovascular: Negative.  Negative for chest pain and palpitations.   Gastrointestinal:  Negative for abdominal pain, diarrhea, nausea and vomiting.  Genitourinary: Negative.  Negative for dysuria and hematuria.  Skin: Negative.  Negative for rash.  Neurological: Negative.  Negative for dizziness and headaches.  All other systems reviewed and are negative.   Vitals:   05/28/23 1535  BP: 114/78  Pulse: 80  Temp: 98.1 F (36.7 C)  SpO2: 96%    Physical Exam Vitals reviewed.  Constitutional:      Appearance: Normal appearance.  HENT:     Head: Normocephalic.     Mouth/Throat:     Mouth: Mucous membranes are moist.     Pharynx: Oropharynx is clear.  Eyes:     Extraocular Movements: Extraocular movements intact.     Conjunctiva/sclera: Conjunctivae normal.     Pupils: Pupils are equal, round, and reactive to light.  Cardiovascular:     Rate and Rhythm: Normal rate and regular rhythm.     Pulses: Normal pulses.     Heart sounds: Normal heart sounds.  Pulmonary:     Effort: Pulmonary effort is normal.     Breath sounds: Normal breath sounds.  Abdominal:     Palpations: Abdomen is soft.     Tenderness: There is no abdominal tenderness.  Musculoskeletal:     Cervical back: No tenderness.  Lymphadenopathy:     Cervical: No cervical adenopathy.  Skin:    General: Skin is warm and dry.     Capillary Refill: Capillary refill takes less than 2 seconds.  Neurological:     General: No focal deficit present.     Mental Status: She is alert and oriented to person, place, and time.  Psychiatric:        Mood and Affect: Mood normal.        Behavior: Behavior normal.    Results for orders placed or performed in visit on 05/28/23 (from the past 24 hour(s))  POCT HgB A1C     Status: None   Collection Time: 05/28/23  3:59 PM  Result Value Ref Range   Hemoglobin A1C 5.4 4.0 - 5.6 %   HbA1c POC (<> result, manual entry)     HbA1c, POC (prediabetic range)     HbA1c, POC (controlled diabetic range)       ASSESSMENT & PLAN: A total of 47 minutes  was spent with the patient and counseling/coordination of care regarding preparing for this visit, review of most recent office visit notes, review of multiple chronic medical conditions and their management, cardiovascular risks associated with hypertension and diabetes, review of all medications, review of most recent bloodwork results including interpretation of today's hemoglobin A1c, review of health maintenance items, education  on nutrition, prognosis, documentation, and need for follow up.   Problem List Items Addressed This Visit       Cardiovascular and Mediastinum   Hypertension associated with diabetes (HCC) - Primary    Well-controlled hypertension Continue atenolol 50 mg daily and Hyzaar 50-12.5 mg daily Well-controlled diabetes with hemoglobin A1c of 5.4 Continue weekly Mounjaro 7.5 mg and metformin 500 mg twice a day Cardiovascular risk associated with diabetes and hypertension discussed Diet and nutrition discussed Blood work done today Follow-up in 3 months      Relevant Orders   POCT HgB A1C (Completed)     Endocrine   Hypothyroidism, postradioiodine therapy    Clinically euthyroid Continue levothyroxine 112 mcg daily      Dyslipidemia associated with type 2 diabetes mellitus (HCC)    Chronic stable condition Diet and nutrition discussed Continue rosuvastatin 10 mg daily        Other   Morbid obesity (HCC)    Eating better and losing weight. Successfully losing weight Diet and nutrition discussed Continue Mounjaro and metformin same doses.  No changes      Patient Instructions  Diabetes Mellitus and Nutrition, Adult When you have diabetes, or diabetes mellitus, it is very important to have healthy eating habits because your blood sugar (glucose) levels are greatly affected by what you eat and drink. Eating healthy foods in the right amounts, at about the same times every day, can help you: Manage your blood glucose. Lower your risk of heart  disease. Improve your blood pressure. Reach or maintain a healthy weight. What can affect my meal plan? Every person with diabetes is different, and each person has different needs for a meal plan. Your health care provider may recommend that you work with a dietitian to make a meal plan that is best for you. Your meal plan may vary depending on factors such as: The calories you need. The medicines you take. Your weight. Your blood glucose, blood pressure, and cholesterol levels. Your activity level. Other health conditions you have, such as heart or kidney disease. How do carbohydrates affect me? Carbohydrates, also called carbs, affect your blood glucose level more than any other type of food. Eating carbs raises the amount of glucose in your blood. It is important to know how many carbs you can safely have in each meal. This is different for every person. Your dietitian can help you calculate how many carbs you should have at each meal and for each snack. How does alcohol affect me? Alcohol can cause a decrease in blood glucose (hypoglycemia), especially if you use insulin or take certain diabetes medicines by mouth. Hypoglycemia can be a life-threatening condition. Symptoms of hypoglycemia, such as sleepiness, dizziness, and confusion, are similar to symptoms of having too much alcohol. Do not drink alcohol if: Your health care provider tells you not to drink. You are pregnant, may be pregnant, or are planning to become pregnant. If you drink alcohol: Limit how much you have to: 0-1 drink a day for women. 0-2 drinks a day for men. Know how much alcohol is in your drink. In the [place] one drink equals one 12 oz bottle of beer (355 mL), one 5 oz glass of wine (148 mL), or one 1 oz glass of hard liquor (44 mL). Keep yourself hydrated with water, diet soda, or unsweetened iced tea. Keep in mind that regular soda, juice, and other mixers may contain a lot of sugar and must be counted as  carbs. What are  tips for following this plan?  Reading food labels Start by checking the serving size on the Nutrition Facts label of packaged foods and drinks. The number of calories and the amount of carbs, fats, and other nutrients listed on the label are based on one serving of the item. Many items contain more than one serving per package. Check the total grams (g) of carbs in one serving. Check the number of grams of saturated fats and trans fats in one serving. Choose foods that have a low amount or none of these fats. Check the number of milligrams (mg) of salt (sodium) in one serving. Most people should limit total sodium intake to less than 2,300 mg per day. Always check the nutrition information of foods labeled as "low-fat" or "nonfat." These foods may be higher in added sugar or refined carbs and should be avoided. Talk to your dietitian to identify your daily goals for nutrients listed on the label. Shopping Avoid buying canned, pre-made, or processed foods. These foods tend to be high in fat, sodium, and added sugar. Shop around the outside edge of the grocery store. This is where you will most often find fresh fruits and vegetables, bulk grains, fresh meats, and fresh dairy products. Cooking Use low-heat cooking methods, such as baking, instead of high-heat cooking methods, such as deep frying. Cook using healthy oils, such as olive, canola, or sunflower oil. Avoid cooking with butter, cream, or high-fat meats. Meal planning Eat meals and snacks regularly, preferably at the same times every day. Avoid going long periods of time without eating. Eat foods that are high in fiber, such as fresh fruits, vegetables, beans, and whole grains. Eat 4-6 oz (112-168 g) of lean protein each day, such as lean meat, chicken, fish, eggs, or tofu. One ounce (oz) (28 g) of lean protein is equal to: 1 oz (28 g) of meat, chicken, or fish. 1 egg.  cup (62 g) of tofu. Eat some foods each day that  contain healthy fats, such as avocado, nuts, seeds, and fish. What foods should I eat? Fruits Berries. Apples. Oranges. Peaches. Apricots. Plums. Grapes. Mangoes. Papayas. Pomegranates. Kiwi. Cherries. Vegetables Leafy greens, including lettuce, spinach, kale, chard, collard greens, mustard greens, and cabbage. Beets. Cauliflower. Broccoli. Carrots. Green beans. Tomatoes. Peppers. Onions. Cucumbers. Brussels sprouts. Grains Whole grains, such as whole-wheat or whole-grain bread, crackers, tortillas, cereal, and pasta. Unsweetened oatmeal. Quinoa. Brown or wild rice. Meats and other proteins Seafood. Poultry without skin. Lean cuts of poultry and beef. Tofu. Nuts. Seeds. Dairy Low-fat or fat-free dairy products such as milk, yogurt, and cheese. The items listed above may not be a complete list of foods and beverages you can eat and drink. Contact a dietitian for more information. What foods should I avoid? Fruits Fruits canned with syrup. Vegetables Canned vegetables. Frozen vegetables with butter or cream sauce. Grains Refined white flour and flour products such as bread, pasta, snack foods, and cereals. Avoid all processed foods. Meats and other proteins Fatty cuts of meat. Poultry with skin. Breaded or fried meats. Processed meat. Avoid saturated fats. Dairy Full-fat yogurt, cheese, or milk. Beverages Sweetened drinks, such as soda or iced tea. The items listed above may not be a complete list of foods and beverages you should avoid. Contact a dietitian for more information. Questions to ask a health care provider Do I need to meet with a certified diabetes care and education specialist? Do I need to meet with a dietitian? What number can I call if  I have questions? When are the best times to check my blood glucose? Where to find more information: American Diabetes Association: diabetes.org Academy of Nutrition and Dietetics: eatright.[employer] of Diabetes and  Digestive and Kidney Diseases: StageSync.si Association of Diabetes Care & Education Specialists: diabeteseducator.org Summary It is important to have healthy eating habits because your blood sugar (glucose) levels are greatly affected by what you eat and drink. It is important to use alcohol carefully. A healthy meal plan will help you manage your blood glucose and lower your risk of heart disease. Your health care provider may recommend that you work with a dietitian to make a meal plan that is best for you. This information is not intended to replace advice given to you by your health care provider. Make sure you discuss any questions you have with your health care provider. Document Revised: 01/06/2020 Document Reviewed: 01/06/2020 Elsevier Patient Education  2024 Elsevier Inc.     Edwina Barth, MD [facility]

## 2023-05-28 NOTE — Assessment & Plan Note (Signed)
Clinically euthyroid. Continue levothyroxine 112 mcg daily.

## 2023-05-28 NOTE — Assessment & Plan Note (Signed)
Well-controlled hypertension Continue atenolol 50 mg daily and Hyzaar 50-12.5 mg daily Well-controlled diabetes with hemoglobin A1c of 5.4 Continue weekly Mounjaro 7.5 mg and metformin 500 mg twice a day Cardiovascular risk associated with diabetes and hypertension discussed Diet and nutrition discussed Blood work done today Follow-up in 3 months

## 2023-05-29 LAB — COMPREHENSIVE METABOLIC PANEL
ALT: 8 U/L (ref 0–35)
AST: 13 U/L (ref 0–37)
Albumin: 3.9 g/dL (ref 3.5–5.2)
Alkaline Phosphatase: 132 U/L — ABNORMAL HIGH (ref 39–117)
BUN: 13 mg/dL (ref 6–23)
CO2: 31 meq/L (ref 19–32)
Calcium: 8.9 mg/dL (ref 8.4–10.5)
Chloride: 101 meq/L (ref 96–112)
Creatinine, Ser: 1.04 mg/dL (ref 0.40–1.20)
GFR: 61.22 mL/min (ref >60.00)
Glucose, Bld: 81 mg/dL (ref 70–99)
Potassium: 3.8 meq/L (ref 3.5–5.1)
Sodium: 139 meq/L (ref 135–145)
Total Bilirubin: 0.6 mg/dL (ref 0.2–1.2)
Total Protein: 7.3 g/dL (ref 6.0–8.3)

## 2023-05-29 LAB — LIPID PANEL
Cholesterol: 180 mg/dL (ref 0–200)
HDL: 43 mg/dL (ref >39.00)
LDL Cholesterol: 114 mg/dL — ABNORMAL HIGH (ref 0–99)
NonHDL: 136.96
Total CHOL/HDL Ratio: 4
Triglycerides: 113 mg/dL (ref 0.0–149.0)
VLDL: 22.6 mg/dL (ref 0.0–40.0)

## 2023-05-29 LAB — CBC WITH DIFFERENTIAL/PLATELET
Basophils Absolute: 0 10*3/uL (ref 0.0–0.1)
Basophils Relative: 0.4 % (ref 0.0–3.0)
Eosinophils Absolute: 0.1 10*3/uL (ref 0.0–0.7)
Eosinophils Relative: 2.5 % (ref 0.0–5.0)
HCT: 33.4 % — ABNORMAL LOW (ref 36.0–46.0)
Hemoglobin: 11 g/dL — ABNORMAL LOW (ref 12.0–15.0)
Lymphocytes Relative: 33.6 % (ref 12.0–46.0)
Lymphs Abs: 1.9 10*3/uL (ref 0.7–4.0)
MCHC: 32.9 g/dL (ref 30.0–36.0)
MCV: 93.4 fL (ref 78.0–100.0)
Monocytes Absolute: 0.5 10*3/uL (ref 0.1–1.0)
Monocytes Relative: 8.1 % (ref 3.0–12.0)
Neutro Abs: 3.1 10*3/uL (ref 1.4–7.7)
Neutrophils Relative %: 55.4 % (ref 43.0–77.0)
Platelets: 340 10*3/uL (ref 150.0–400.0)
RBC: 3.57 Mil/uL — ABNORMAL LOW (ref 3.87–5.11)
RDW: 15.4 % (ref 11.5–15.5)
WBC: 5.6 10*3/uL (ref 4.0–10.5)

## 2023-07-18 ENCOUNTER — Other Ambulatory Visit: Payer: Self-pay | Admitting: Emergency Medicine

## 2023-07-18 DIAGNOSIS — E1169 Type 2 diabetes mellitus with other specified complication: Secondary | ICD-10-CM

## 2023-07-18 DIAGNOSIS — E1159 Type 2 diabetes mellitus with other circulatory complications: Secondary | ICD-10-CM

## 2023-07-19 MED ORDER — MOUNJARO 7.5 MG/0.5ML ~~LOC~~ SOAJ: 7.5000 mg | SUBCUTANEOUS | 0 refills | Status: DC

## 2023-07-31 ENCOUNTER — Other Ambulatory Visit: Payer: Self-pay

## 2023-08-08 ENCOUNTER — Ambulatory Visit: Payer: 59 | Admitting: Endocrinology

## 2023-08-20 ENCOUNTER — Ambulatory Visit: Payer: 59 | Admitting: Endocrinology

## 2023-08-21 ENCOUNTER — Ambulatory Visit: Admitting: Endocrinology

## 2023-08-21 ENCOUNTER — Encounter: Payer: Self-pay | Admitting: Endocrinology

## 2023-08-21 ENCOUNTER — Other Ambulatory Visit: Payer: Self-pay

## 2023-08-21 VITALS — BP 122/78 | HR 86 | Ht 64.0 in | Wt 215.0 lb

## 2023-08-21 DIAGNOSIS — E890 Postprocedural hypothyroidism: Secondary | ICD-10-CM

## 2023-08-21 DIAGNOSIS — Z8639 Personal history of other endocrine, nutritional and metabolic disease: Secondary | ICD-10-CM

## 2023-08-21 NOTE — Progress Notes (Signed)
 Outpatient Endocrinology Note Danielle Posey Jasmin, MD   Patient's Name: Danielle Khan    DOB: Aug 11, 1970    MRN: [id number]  REASON OF VISIT: Follow-up for hypothyroidism  PCP: Georgina Quint, MD  HISTORY OF PRESENT ILLNESS:   Danielle Khan is a 53 y.o. old female with past medical history as listed below is presented for a follow up for postablative hypothyroidism.   Pertinent Thyroid History: Patient was previously seen by Dr. Lucianne Muss and was last time seen in April 2024.  Patient was initially seen in endocrinology clinic in October 2013, for the management of hypothyroidism.  She has history of Graves' disease since 2001 was on long-term methimazole treatment.  Because of persistent hyperthyroidism and free T4 of 1.65 she was treated with radioactive iodine ablation I-131 on May 02, 2012 with 16.75 mCi.  When seen for follow-up in March 2015 she was significantly hypothyroid with symptoms of feeling cold, tired and hair falling.  She was started on levothyroxine 150 mcg daily.  Levothyroxine dose was periodically adjusted in the past.  # She has type 2 diabetes mellitus control, on metformin and Mounjaro managed by primary care provider.  Interval history Patient has been taking levothyroxine 112 mcg daily for 6 days and half tablet on Sundays.  She denies palpitation or heat intolerance.  Overall feeling good.  Body weight has been relatively stable.  She complains of constipation, she thinks could be related with taking Mounjaro.  She reports compliance with taking levothyroxine.  She is going to run out from tomorrow.  No other complaints today.  REVIEW OF SYSTEMS:  As per history of present illness.   PAST MEDICAL HISTORY: Past Medical History:  Diagnosis Date   Chronic constipation    uses metamucil   GERD (gastroesophageal reflux disease)    Hypertension    Thyroid disease     PAST SURGICAL HISTORY: Past Surgical History:  Procedure  Laterality Date   ABDOMINAL HYSTERECTOMY      ALLERGIES: No Known Allergies  FAMILY HISTORY:  Family History  Problem Relation Age of Onset   Breast cancer Maternal Aunt    Ovarian cancer Maternal Aunt    Colon cancer Neg Hx    Colon polyps Neg Hx    Esophageal cancer Neg Hx    Rectal cancer Neg Hx    Stomach cancer Neg Hx     SOCIAL HISTORY: Social History   Socioeconomic History   Marital status: Married    Spouse name: Not on file   Number of children: Not on file   Years of education: Not on file   Highest education level: Not on file  Occupational History   Not on file  Tobacco Use   Smoking status: Never   Smokeless tobacco: Current    Types: Snuff   Tobacco comments:    last used 10/26/19  Substance and Sexual Activity   Alcohol use: No    Alcohol/week: 0.0 standard drinks of alcohol   Drug use: No   Sexual activity: Yes  Other Topics Concern   Not on file  Social History Narrative   Not on file   Social Drivers of Health   Financial Resource Strain: Not on file  Food Insecurity: Not on file  Transportation Needs: Not on file  Physical Activity: Not on file  Stress: Not on file  Social Connections: Not on file    MEDICATIONS:  Current Outpatient Medications  Medication Sig Dispense Refill   atenolol (TENORMIN)  50 MG tablet Take 1 tablet by mouth once daily 90 tablet 0   Cyanocobalamin (VITAMIN B 12 PO) Take 5,000 mg by mouth every other day.     levothyroxine (SYNTHROID) 112 MCG tablet Take 1 tablet by mouth once daily 90 tablet 2   losartan-hydrochlorothiazide (HYZAAR) 50-12.5 MG tablet Take 1 tablet by mouth once daily 90 tablet 0   MAGNESIUM PO Take by mouth once a week.     metFORMIN (GLUCOPHAGE) 500 MG tablet TAKE 1 TABLET BY MOUTH TWICE DAILY WITH A MEAL 180 tablet 0   psyllium (METAMUCIL) 58.6 % powder Take 1 packet by mouth as needed.     rosuvastatin (CRESTOR) 10 MG tablet Take 1 tablet (10 mg total) by mouth daily. 90 tablet 3    tirzepatide (MOUNJARO) 7.5 MG/0.5ML Pen Inject 7.5 mg into the skin once a week. 8 mL 0   triamcinolone (NASACORT AQ) 55 MCG/ACT AERO nasal inhaler Place 2 sprays into the nose daily as needed. 1 Inhaler 5   No current facility-administered medications for this visit.    PHYSICAL EXAM: Vitals:   08/21/23 1520  BP: 122/78  Pulse: 86  SpO2: 97%  Weight: 215 lb (97.5 kg)  Height: 5\' 4"  (1.626 m)   Body mass index is 36.9 kg/m.  Wt Readings from Last 3 Encounters:  08/21/23 215 lb (97.5 kg)  05/28/23 215 lb (97.5 kg)  02/13/23 217 lb 8 oz (98.7 kg)    General: Well developed, well nourished female in no apparent distress.  HEENT: AT/[place], no external lesions. Hearing intact to the spoken word Eyes: EOMI. No stare, proptosis or lid lag. Conjunctiva clear and no icterus. Neck: Trachea midline, neck supple  Lungs: Clear to auscultation, no wheeze. Respirations not labored Heart: S1S2, Regular in rate and rhythm. No loud murmurs Abdomen: Soft, non tender, non distended, no masses, no striae Neurologic: Alert, oriented, normal speech, deep tendon biceps reflexes normal,  no gross focal neurological deficit Extremities: No pedal pitting edema, no tremors of outstretched hands.  Acanthosis nigricans present. Skin: Warm, color good.  Psychiatric: Does not appear depressed or anxious  PERTINENT HISTORIC LABORATORY AND IMAGING STUDIES:  All pertinent laboratory results were reviewed. Please see HPI also for further details.   TSH  Date Value Ref Range Status  09/25/2022 2.00 0.35 - 5.50 uIU/mL Final  08/07/2022 2.02 0.35 - 5.50 uIU/mL Final  03/20/2022 2.02 0.35 - 5.50 uIU/mL Final     ASSESSMENT / PLAN  1. Hypothyroidism, postradioiodine therapy   2. H/O Graves' disease     Patient has postablative hypothyroidism has history of Graves' disease status post RAI therapy in November 2013.  She has developed hypothyroid at least from March 2015.   -She is currently taking  levothyroxine 112 mcg daily for 6 days and half tablet on Sundays.  Plan: -Check thyroid function test today TSH, free T4 -Will adjust the dose of levothyroxine as needed.   We discussed the medical need for compliance with levothyroxine therapy, that it is a hormone necessary for life, and that serious consequences may result from noncompliance. Discussed the proper method of levothyroxine administration: take on an empty stomach in the morning, with water, waiting thirty to sixty minutes before taking any other beverages or food. Also reviewed the need to take calcium or iron supplements or multivitamin (that may contain iron or calcium) at least 4 hours after levothyroxine administration.   Kennady was seen today for follow-up.  Diagnoses and all orders for this visit:  Hypothyroidism, postradioiodine therapy -     T4, free -     TSH  H/O Graves' disease    DISPOSITION Follow up in clinic in 6 months suggested.  Labs today and prior to follow-up visit in 6 months.  All questions answered and patient verbalized understanding of the plan.  Danielle Abhimanyu Cruces, MD [facility] [address] [place], [address] [place], [place] [address] Phone # [phone]  At least part of this note was generated using voice recognition software. Inadvertent word errors may have occurred, which were not recognized during the proofreading process.

## 2023-08-22 ENCOUNTER — Encounter: Payer: Self-pay | Admitting: Endocrinology

## 2023-08-22 ENCOUNTER — Other Ambulatory Visit: Payer: Self-pay | Admitting: Emergency Medicine

## 2023-08-22 DIAGNOSIS — E1165 Type 2 diabetes mellitus with hyperglycemia: Secondary | ICD-10-CM

## 2023-08-22 DIAGNOSIS — I10 Essential (primary) hypertension: Secondary | ICD-10-CM

## 2023-08-22 LAB — TSH: TSH: 4.12 m[IU]/L

## 2023-08-22 LAB — T4, FREE: Free T4: 1.3 ng/dL (ref 0.8–1.8)

## 2023-08-22 MED ORDER — LEVOTHYROXINE SODIUM 112 MCG PO TABS: 112.0000 ug | ORAL_TABLET | Freq: Every day | ORAL | 3 refills | Status: DC

## 2023-08-22 NOTE — Addendum Note (Signed)
 Addended by: Jabier Deese, [place] on: 08/22/2023 08:15 AM   Modules accepted: Orders

## 2023-10-09 ENCOUNTER — Other Ambulatory Visit: Payer: Self-pay | Admitting: Emergency Medicine

## 2023-10-09 DIAGNOSIS — E1159 Type 2 diabetes mellitus with other circulatory complications: Secondary | ICD-10-CM

## 2023-10-09 DIAGNOSIS — E785 Hyperlipidemia, unspecified: Secondary | ICD-10-CM

## 2023-10-10 MED ORDER — MOUNJARO 7.5 MG/0.5ML ~~LOC~~ SOAJ: 7.5000 mg | SUBCUTANEOUS | 0 refills | Status: DC

## 2023-11-26 ENCOUNTER — Ambulatory Visit: Payer: 59 | Admitting: Emergency Medicine

## 2023-11-26 ENCOUNTER — Encounter: Payer: Self-pay | Admitting: Emergency Medicine

## 2023-11-26 ENCOUNTER — Other Ambulatory Visit: Payer: Self-pay | Admitting: Emergency Medicine

## 2023-11-26 VITALS — BP 102/68 | HR 79 | Temp 98.1°F | Ht 64.0 in | Wt 210.0 lb

## 2023-11-26 DIAGNOSIS — I10 Essential (primary) hypertension: Secondary | ICD-10-CM

## 2023-11-26 DIAGNOSIS — E1169 Type 2 diabetes mellitus with other specified complication: Secondary | ICD-10-CM

## 2023-11-26 DIAGNOSIS — E785 Hyperlipidemia, unspecified: Secondary | ICD-10-CM

## 2023-11-26 DIAGNOSIS — I152 Hypertension secondary to endocrine disorders: Secondary | ICD-10-CM

## 2023-11-26 DIAGNOSIS — E1159 Type 2 diabetes mellitus with other circulatory complications: Secondary | ICD-10-CM | POA: Diagnosis not present

## 2023-11-26 DIAGNOSIS — Z7985 Long-term (current) use of injectable non-insulin antidiabetic drugs: Secondary | ICD-10-CM | POA: Diagnosis not present

## 2023-11-26 DIAGNOSIS — E890 Postprocedural hypothyroidism: Secondary | ICD-10-CM

## 2023-11-26 LAB — CBC WITH DIFFERENTIAL/PLATELET
Basophils Absolute: 0.1 10*3/uL (ref 0.0–0.1)
Basophils Relative: 0.9 % (ref 0.0–3.0)
Eosinophils Absolute: 0.1 10*3/uL (ref 0.0–0.7)
Eosinophils Relative: 1.9 % (ref 0.0–5.0)
HCT: 33.4 % — ABNORMAL LOW (ref 36.0–46.0)
Hemoglobin: 11 g/dL — ABNORMAL LOW (ref 12.0–15.0)
Lymphocytes Relative: 40.3 % (ref 12.0–46.0)
Lymphs Abs: 2.7 10*3/uL (ref 0.7–4.0)
MCHC: 33.1 g/dL (ref 30.0–36.0)
MCV: 93.7 fl (ref 78.0–100.0)
Monocytes Absolute: 0.5 10*3/uL (ref 0.1–1.0)
Monocytes Relative: 7.7 % (ref 3.0–12.0)
Neutro Abs: 3.3 10*3/uL (ref 1.4–7.7)
Neutrophils Relative %: 49.2 % (ref 43.0–77.0)
Platelets: 296 10*3/uL (ref 150.0–400.0)
RBC: 3.57 Mil/uL — ABNORMAL LOW (ref 3.87–5.11)
RDW: 14.3 % (ref 11.5–15.5)
WBC: 6.6 10*3/uL (ref 4.0–10.5)

## 2023-11-26 LAB — COMPREHENSIVE METABOLIC PANEL WITH GFR
ALT: 8 U/L (ref 0–35)
AST: 13 U/L (ref 0–37)
Albumin: 4.2 g/dL (ref 3.5–5.2)
Alkaline Phosphatase: 118 U/L — ABNORMAL HIGH (ref 39–117)
BUN: 17 mg/dL (ref 6–23)
CO2: 31 meq/L (ref 19–32)
Calcium: 9 mg/dL (ref 8.4–10.5)
Chloride: 101 meq/L (ref 96–112)
Creatinine, Ser: 0.93 mg/dL (ref 0.40–1.20)
GFR: 69.77 mL/min (ref >60.00)
Glucose, Bld: 82 mg/dL (ref 70–99)
Potassium: 3.6 meq/L (ref 3.5–5.1)
Sodium: 139 meq/L (ref 135–145)
Total Bilirubin: 0.6 mg/dL (ref 0.2–1.2)
Total Protein: 7.1 g/dL (ref 6.0–8.3)

## 2023-11-26 LAB — LIPID PANEL
Cholesterol: 165 mg/dL (ref 0–200)
HDL: 48.6 mg/dL (ref >39.00)
LDL Cholesterol: 88 mg/dL (ref 0–99)
NonHDL: 116.63
Total CHOL/HDL Ratio: 3
Triglycerides: 143 mg/dL (ref 0.0–149.0)
VLDL: 28.6 mg/dL (ref 0.0–40.0)

## 2023-11-26 LAB — MICROALBUMIN / CREATININE URINE RATIO
Creatinine,U: 206.9 mg/dL
Microalb Creat Ratio: UNDETERMINED mg/g (ref 0.0–30.0)
Microalb, Ur: <0.7 mg/dL

## 2023-11-26 NOTE — Assessment & Plan Note (Signed)
Chronic stable condition Diet and nutrition discussed Continue rosuvastatin 10 mg daily

## 2023-11-26 NOTE — Assessment & Plan Note (Signed)
 Clinically euthyroid Continue levothyroxine  112 mcg daily TSH done today

## 2023-11-26 NOTE — Progress Notes (Signed)
 Danielle Khan Danielle Khan 53 y.o.   Chief Complaint  Patient presents with   Follow-up    6 month f/u for HTN / DM. No other concerns.     HISTORY OF PRESENT ILLNESS: This is a 53 y.o. female here for 71-month follow-up of chronic medical conditions including diabetes, hypertension and dyslipidemia Overall doing well.  Responding well to Mounjaro . Has no complaints or medical concerns today. BP Readings from Last 3 Encounters:  11/26/23 102/68  08/21/23 122/78  05/28/23 114/78   Wt Readings from Last 3 Encounters:  11/26/23 210 lb (95.3 kg)  08/21/23 215 lb (97.5 kg)  05/28/23 215 lb (97.5 kg)     HPI   Prior to Admission medications   Medication Sig Start Date End Date Taking? Authorizing Provider  atenolol  (TENORMIN ) 50 MG tablet Take 1 tablet by mouth once daily 11/26/23  Yes Aurielle Slingerland, Kerrick, MD  Cyanocobalamin (VITAMIN B 12 PO) Take 5,000 mg by mouth every other day.   Yes [provider]  levothyroxine  (SYNTHROID ) 112 MCG tablet Take 1 tablet (112 mcg total) by mouth daily. 08/22/23  Yes Thapa, [place], MD  losartan -hydrochlorothiazide (HYZAAR) 50-12.5 MG tablet Take 1 tablet by mouth once daily 11/26/23  Yes Ryane Konieczny Jose, MD  MAGNESIUM PO Take by mouth once a week.   Yes [provider]  metFORMIN  (GLUCOPHAGE ) 500 MG tablet TAKE 1 TABLET BY MOUTH TWICE DAILY WITH A MEAL 08/22/23  Yes Anush Wiedeman, Isidro Margo, MD  psyllium (METAMUCIL) 58.6 % powder Take 1 packet by mouth as needed.   Yes [provider]  rosuvastatin  (CRESTOR ) 10 MG tablet Take 1 tablet (10 mg total) by mouth daily. 04/11/21  Yes Jacinto Keil, Isidro Margo, MD  tirzepatide  (MOUNJARO ) 7.5 MG/0.5ML Pen Inject 7.5 mg into the skin once a week. 10/10/23  Yes Ahmia Colford Jose, MD  triamcinolone  (NASACORT  AQ) 55 MCG/ACT AERO nasal inhaler Place 2 sprays into the nose daily as needed. 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   Hypertension associated with diabetes (HCC) 08/19/2013    Past Medical History:  Diagnosis Date   Chronic constipation    uses metamucil   GERD (gastroesophageal reflux disease)    Hypertension    Thyroid  disease     Past Surgical History:  Procedure Laterality Date   ABDOMINAL HYSTERECTOMY      Social History   Socioeconomic History   Marital status: Married    Spouse name: Not on file   Number of children: Not on file   Years of education: Not on file   Highest education level: Not on file  Occupational History   Not on file  Tobacco Use   Smoking status: Never   Smokeless tobacco: Current    Types: Snuff   Tobacco comments:    last used 10/26/19  Substance and Sexual Activity   Alcohol use: No    Alcohol/week: 0.0 standard drinks of alcohol   Drug use: No   Sexual activity: Yes  Other Topics Concern   Not on file  Social History Narrative   Not on file   Social Drivers of Corporate investment banker  Strain: Not on file  Food Insecurity: Not on file  Transportation Needs: Not on file  Physical Activity: Not on file  Stress: Not on file  Social Connections: Not on file  Intimate Partner Violence: Not on file    Family History  Problem Relation Age of Onset   Breast cancer Maternal Aunt    Ovarian cancer Maternal Aunt    Colon cancer Neg Hx    Colon polyps Neg Hx    Esophageal cancer Neg Hx    Rectal cancer Neg Hx    Stomach cancer Neg Hx      Review of Systems  Constitutional: Negative.  Negative for chills and fever.  HENT: Negative.  Negative for congestion and sore throat.   Respiratory: Negative.  Negative for cough and shortness  of breath.   Cardiovascular: Negative.  Negative for chest pain and palpitations.  Gastrointestinal:  Negative for abdominal pain, diarrhea, nausea and vomiting.  Genitourinary: Negative.  Negative for dysuria and hematuria.  Skin: Negative.  Negative for rash.  Neurological:  Negative for dizziness and headaches.  All other systems reviewed and are negative.   Vitals:   11/26/23 1535  BP: 102/68  Pulse: 79  Temp: 98.1 F (36.7 C)  SpO2: 98%    Physical Exam Vitals reviewed.  Constitutional:      Appearance: Normal appearance.  HENT:     Head: Normocephalic.     Mouth/Throat:     Mouth: Mucous membranes are moist.     Pharynx: Oropharynx is clear.  Eyes:     Extraocular Movements: Extraocular movements intact.     Pupils: Pupils are equal, round, and reactive to light.  Cardiovascular:     Rate and Rhythm: Normal rate and regular rhythm.     Pulses: Normal pulses.     Heart sounds: Normal heart sounds.  Pulmonary:     Effort: Pulmonary effort is normal.     Breath sounds: Normal breath sounds.  Abdominal:     Palpations: Abdomen is soft.     Tenderness: There is no abdominal tenderness.  Musculoskeletal:     Cervical back: No tenderness.  Lymphadenopathy:     Cervical: No cervical adenopathy.  Skin:    General: Skin is warm and dry.     Capillary Refill: Capillary refill takes less than 2 seconds.  Neurological:     General: No focal deficit present.     Mental Status: She is alert and oriented to person, place, and time.  Psychiatric:        Mood and Affect: Mood normal.        Behavior: Behavior normal.      ASSESSMENT & PLAN: A total of 45 minutes was spent with the patient and counseling/coordination of care regarding preparing for this visit, review of most recent office visit notes, review of multiple chronic medical conditions and their management, review of all medications, review of most recent bloodwork results, review of health maintenance items,  education on nutrition, prognosis, documentation, and need for follow up.   Problem List Items Addressed This Visit       Cardiovascular and Mediastinum   Hypertension associated with diabetes (HCC) - Primary   Well-controlled hypertension Continue atenolol  50 mg daily and Hyzaar 50-12.5 mg daily Well-controlled diabetes with hemoglobin A1c of 5.4 Continue weekly Mounjaro  7.5 mg and metformin  500 mg twice a day Cardiovascular risk associated with diabetes and hypertension discussed Diet and nutrition discussed Blood work done today Follow-up in 6 months  Relevant Orders   Comprehensive metabolic panel with GFR   CBC with Differential/Platelet   Hemoglobin A1c   Lipid panel   Microalbumin / creatinine urine ratio     Endocrine   Hypothyroidism, postradioiodine therapy   Clinically euthyroid Continue levothyroxine  112 mcg daily TSH done today      Relevant Orders   TSH   Dyslipidemia associated with type 2 diabetes mellitus (HCC)   Chronic stable condition Diet and nutrition discussed Continue rosuvastatin  10 mg daily      Relevant Orders   Comprehensive metabolic panel with GFR   CBC with Differential/Platelet   Hemoglobin A1c   Lipid panel   Microalbumin / creatinine urine ratio     Other   Morbid obesity (HCC)   Eating better and losing weight. Successfully losing weight Diet and nutrition discussed Continue Mounjaro  and metformin  same doses.  No changes      Patient Instructions  Diabetes Mellitus and Nutrition, Adult When you have diabetes, or diabetes mellitus, it is very important to have healthy eating habits because your blood sugar (glucose) levels are greatly affected by what you eat and drink. Eating healthy foods in the right amounts, at about the same times every day, can help you: Manage your blood glucose. Lower your risk of heart disease. Improve your blood pressure. Reach or maintain a healthy weight. What can affect my meal  plan? Every person with diabetes is different, and each person has different needs for a meal plan. Your health care provider may recommend that you work with a dietitian to make a meal plan that is best for you. Your meal plan may vary depending on factors such as: The calories you need. The medicines you take. Your weight. Your blood glucose, blood pressure, and cholesterol levels. Your activity level. Other health conditions you have, such as heart or kidney disease. How do carbohydrates affect me? Carbohydrates, also called carbs, affect your blood glucose level more than any other type of food. Eating carbs raises the amount of glucose in your blood. It is important to know how many carbs you can safely have in each meal. This is different for every person. Your dietitian can help you calculate how many carbs you should have at each meal and for each snack. How does alcohol affect me? Alcohol can cause a decrease in blood glucose (hypoglycemia), especially if you use insulin or take certain diabetes medicines by mouth. Hypoglycemia can be a life-threatening condition. Symptoms of hypoglycemia, such as sleepiness, dizziness, and confusion, are similar to symptoms of having too much alcohol. Do not drink alcohol if: Your health care provider tells you not to drink. You are pregnant, may be pregnant, or are planning to become pregnant. If you drink alcohol: Limit how much you have to: 0-1 drink a day for women. 0-2 drinks a day for men. Know how much alcohol is in your drink. In the [place] one drink equals one 12 oz bottle of beer (355 mL), one 5 oz glass of wine (148 mL), or one 1 oz glass of hard liquor (44 mL). Keep yourself hydrated with water, diet soda, or unsweetened iced tea. Keep in mind that regular soda, juice, and other mixers may contain a lot of sugar and must be counted as carbs. What are tips for following this plan?  Reading food labels Start by checking the serving size  on the Nutrition Facts label of packaged foods and drinks. The number of calories and the amount of carbs, fats,  and other nutrients listed on the label are based on one serving of the item. Many items contain more than one serving per package. Check the total grams (g) of carbs in one serving. Check the number of grams of saturated fats and trans fats in one serving. Choose foods that have a low amount or none of these fats. Check the number of milligrams (mg) of salt (sodium) in one serving. Most people should limit total sodium intake to less than 2,300 mg per day. Always check the nutrition information of foods labeled as "low-fat" or "nonfat." These foods may be higher in added sugar or refined carbs and should be avoided. Talk to your dietitian to identify your daily goals for nutrients listed on the label. Shopping Avoid buying canned, pre-made, or processed foods. These foods tend to be high in fat, sodium, and added sugar. Shop around the outside edge of the grocery store. This is where you will most often find fresh fruits and vegetables, bulk grains, fresh meats, and fresh dairy products. Cooking Use low-heat cooking methods, such as baking, instead of high-heat cooking methods, such as deep frying. Cook using healthy oils, such as olive, canola, or sunflower oil. Avoid cooking with butter, cream, or high-fat meats. Meal planning Eat meals and snacks regularly, preferably at the same times every day. Avoid going long periods of time without eating. Eat foods that are high in fiber, such as fresh fruits, vegetables, beans, and whole grains. Eat 4-6 oz (112-168 g) of lean protein each day, such as lean meat, chicken, fish, eggs, or tofu. One ounce (oz) (28 g) of lean protein is equal to: 1 oz (28 g) of meat, chicken, or fish. 1 egg.  cup (62 g) of tofu. Eat some foods each day that contain healthy fats, such as avocado, nuts, seeds, and fish. What foods should I eat? Fruits Berries.  Apples. Oranges. Peaches. Apricots. Plums. Grapes. Mangoes. Papayas. Pomegranates. Kiwi. Cherries. Vegetables Leafy greens, including lettuce, spinach, kale, chard, collard greens, mustard greens, and cabbage. Beets. Cauliflower. Broccoli. Carrots. Green beans. Tomatoes. Peppers. Onions. Cucumbers. Brussels sprouts. Grains Whole grains, such as whole-wheat or whole-grain bread, crackers, tortillas, cereal, and pasta. Unsweetened oatmeal. Quinoa. Brown or wild rice. Meats and other proteins Seafood. Poultry without skin. Lean cuts of poultry and beef. Tofu. Nuts. Seeds. Dairy Low-fat or fat-free dairy products such as milk, yogurt, and cheese. The items listed above may not be a complete list of foods and beverages you can eat and drink. Contact a dietitian for more information. What foods should I avoid? Fruits Fruits canned with syrup. Vegetables Canned vegetables. Frozen vegetables with butter or cream sauce. Grains Refined white flour and flour products such as bread, pasta, snack foods, and cereals. Avoid all processed foods. Meats and other proteins Fatty cuts of meat. Poultry with skin. Breaded or fried meats. Processed meat. Avoid saturated fats. Dairy Full-fat yogurt, cheese, or milk. Beverages Sweetened drinks, such as soda or iced tea. The items listed above may not be a complete list of foods and beverages you should avoid. Contact a dietitian for more information. Questions to ask a health care provider Do I need to meet with a certified diabetes care and education specialist? Do I need to meet with a dietitian? What number can I call if I have questions? When are the best times to check my blood glucose? Where to find more information: American Diabetes Association: diabetes.org Academy of Nutrition and Dietetics: eatright.[employer] of Diabetes and Digestive and  Kidney Diseases: StageSync.si Association of Diabetes Care & Education Specialists:  diabeteseducator.org Summary It is important to have healthy eating habits because your blood sugar (glucose) levels are greatly affected by what you eat and drink. It is important to use alcohol carefully. A healthy meal plan will help you manage your blood glucose and lower your risk of heart disease. Your health care provider may recommend that you work with a dietitian to make a meal plan that is best for you. This information is not intended to replace advice given to you by your health care provider. Make sure you discuss any questions you have with your health care provider. Document Revised: 01/05/2020 Document Reviewed: 01/06/2020 Elsevier Patient Education  2024 Elsevier Inc.    Maryagnes Small, MD [facility]

## 2023-11-26 NOTE — Assessment & Plan Note (Signed)
 Well-controlled hypertension Continue atenolol  50 mg daily and Hyzaar 50-12.5 mg daily Well-controlled diabetes with hemoglobin A1c of 5.4 Continue weekly Mounjaro  7.5 mg and metformin  500 mg twice a day Cardiovascular risk associated with diabetes and hypertension discussed Diet and nutrition discussed Blood work done today Follow-up in 6 months

## 2023-11-26 NOTE — Patient Instructions (Signed)

## 2023-11-26 NOTE — Assessment & Plan Note (Signed)
 Eating better and losing weight. Successfully losing weight Diet and nutrition discussed Continue Mounjaro and metformin same doses.  No changes

## 2023-11-27 LAB — HEMOGLOBIN A1C: Hgb A1c MFr Bld: 5.5 % (ref 4.6–6.5)

## 2023-11-28 LAB — TSH: TSH: 1.99 u[IU]/mL (ref 0.35–5.50)

## 2023-11-30 ENCOUNTER — Ambulatory Visit: Payer: Self-pay | Admitting: Emergency Medicine

## 2023-12-27 ENCOUNTER — Other Ambulatory Visit: Payer: Self-pay | Admitting: Emergency Medicine

## 2023-12-27 DIAGNOSIS — E1169 Type 2 diabetes mellitus with other specified complication: Secondary | ICD-10-CM

## 2023-12-27 DIAGNOSIS — I152 Hypertension secondary to endocrine disorders: Secondary | ICD-10-CM

## 2024-02-14 ENCOUNTER — Other Ambulatory Visit: Payer: Self-pay | Admitting: Endocrinology

## 2024-02-14 DIAGNOSIS — E890 Postprocedural hypothyroidism: Secondary | ICD-10-CM

## 2024-02-21 ENCOUNTER — Other Ambulatory Visit: Payer: Self-pay | Admitting: Emergency Medicine

## 2024-02-21 DIAGNOSIS — I10 Essential (primary) hypertension: Secondary | ICD-10-CM

## 2024-02-21 DIAGNOSIS — E1165 Type 2 diabetes mellitus with hyperglycemia: Secondary | ICD-10-CM

## 2024-02-24 ENCOUNTER — Other Ambulatory Visit

## 2024-02-24 LAB — TSH: TSH: 3.34 m[IU]/L

## 2024-02-24 LAB — T4, FREE: Free T4: 1.4 ng/dL (ref 0.8–1.8)

## 2024-02-25 ENCOUNTER — Ambulatory Visit: Payer: Self-pay | Admitting: Endocrinology

## 2024-02-27 ENCOUNTER — Ambulatory Visit: Admitting: Endocrinology

## 2024-02-27 ENCOUNTER — Encounter: Payer: Self-pay | Admitting: Endocrinology

## 2024-02-27 VITALS — BP 90/60 | HR 80 | Resp 20 | Ht 64.0 in | Wt 207.8 lb

## 2024-02-27 DIAGNOSIS — E890 Postprocedural hypothyroidism: Secondary | ICD-10-CM | POA: Diagnosis not present

## 2024-02-27 DIAGNOSIS — Z8639 Personal history of other endocrine, nutritional and metabolic disease: Secondary | ICD-10-CM

## 2024-02-27 MED ORDER — LEVOTHYROXINE SODIUM 112 MCG PO TABS: 112.0000 ug | ORAL_TABLET | Freq: Every day | ORAL | 3 refills | Status: AC

## 2024-02-27 NOTE — Progress Notes (Signed)
 Outpatient Endocrinology Note [place] Natnael Biederman, MD   Patient's Name: Danielle Khan    DOB: August 11, 1970    MRN: [id number]  REASON OF VISIT: Follow-up for hypothyroidism  PCP: Purcell Emil Schanz, MD  HISTORY OF PRESENT ILLNESS:   Danielle Khan is a 53 y.o. old female with past medical history as listed below is presented for a follow up for postablative hypothyroidism.   Pertinent Thyroid  History: Patient was previously seen by Dr. Von and was last time seen in April 2024.  Patient was initially seen in endocrinology clinic in October 2013, for the management of hypothyroidism.  She has history of Graves' disease since 2001 was on long-term methimazole treatment.  Because of persistent hyperthyroidism and free T4 of 1.65 she was treated with radioactive iodine ablation I-131 on May 02, 2012 with 16.75 mCi.  When seen for follow-up in March 2015 she was significantly hypothyroid with symptoms of feeling cold, tired and hair falling.  She was started on levothyroxine  150 mcg daily.  Levothyroxine  dose was periodically adjusted in the past.  # She has type 2 diabetes mellitus control, on metformin  and Mounjaro  managed by primary care provider.  Interval history Patient has been taking levothyroxine  112 mcg daily for 6 days and half tablet on Sundays.  She denies palpitation and heat intolerance.  Overall feeling good.  No hypo or hyperthyroid symptoms.  She denies any watering or redness of the eyes.  Recent thyroid  function test normal as follows.   Latest Reference Range & Units 02/24/24 14:16  TSH mIU/L 3.34  T4,Free(Direct) 0.8 - 1.8 ng/dL 1.4    REVIEW OF SYSTEMS:  As per history of present illness.   PAST MEDICAL HISTORY: Past Medical History:  Diagnosis Date   Chronic constipation    uses metamucil   GERD (gastroesophageal reflux disease)    Hypertension    Thyroid  disease     PAST SURGICAL HISTORY: Past Surgical History:  Procedure  Laterality Date   ABDOMINAL HYSTERECTOMY      ALLERGIES: No Known Allergies  FAMILY HISTORY:  Family History  Problem Relation Age of Onset   Breast cancer Maternal Aunt    Ovarian cancer Maternal Aunt    Colon cancer Neg Hx    Colon polyps Neg Hx    Esophageal cancer Neg Hx    Rectal cancer Neg Hx    Stomach cancer Neg Hx     SOCIAL HISTORY: Social History   Socioeconomic History   Marital status: Married    Spouse name: Not on file   Number of children: Not on file   Years of education: Not on file   Highest education level: Not on file  Occupational History   Not on file  Tobacco Use   Smoking status: Never   Smokeless tobacco: Former    Types: Snuff   Tobacco comments:    last used 10/26/19  Substance and Sexual Activity   Alcohol use: No    Alcohol/week: 0.0 standard drinks of alcohol   Drug use: No   Sexual activity: Yes  Other Topics Concern   Not on file  Social History Narrative   Not on file   Social Drivers of Health   Financial Resource Strain: Not on file  Food Insecurity: Not on file  Transportation Needs: Not on file  Physical Activity: Not on file  Stress: Not on file  Social Connections: Not on file    MEDICATIONS:  Current Outpatient Medications  Medication Sig Dispense  Refill   atenolol  (TENORMIN ) 50 MG tablet Take 1 tablet by mouth once daily 90 tablet 0   Cyanocobalamin (VITAMIN B 12 PO) Take 5,000 mg by mouth every other day.     levothyroxine  (SYNTHROID ) 112 MCG tablet Take 1 tablet (112 mcg total) by mouth daily. 90 tablet 3   losartan -hydrochlorothiazide (HYZAAR) 50-12.5 MG tablet Take 1 tablet by mouth once daily 90 tablet 0   MAGNESIUM PO Take by mouth once a week.     metFORMIN  (GLUCOPHAGE ) 500 MG tablet TAKE 1 TABLET BY MOUTH TWICE DAILY WITH A MEAL 180 tablet 0   MOUNJARO  7.5 MG/0.5ML Pen INJECT 7.5 MG SUBCUTANEOUSLY  ONCE A WEEK 16 mL 0   psyllium (METAMUCIL) 58.6 % powder Take 1 packet by mouth as needed.      rosuvastatin  (CRESTOR ) 10 MG tablet Take 1 tablet (10 mg total) by mouth daily. 90 tablet 3   triamcinolone  (NASACORT  AQ) 55 MCG/ACT AERO nasal inhaler Place 2 sprays into the nose daily as needed. 1 Inhaler 5   No current facility-administered medications for this visit.    PHYSICAL EXAM: Vitals:   02/27/24 1543  BP: 90/60  Pulse: 80  Resp: 20  SpO2: 99%  Weight: 207 lb 12.8 oz (94.3 kg)  Height: 5' 4 (1.626 m)   Body mass index is 35.67 kg/m.  Wt Readings from Last 3 Encounters:  02/27/24 207 lb 12.8 oz (94.3 kg)  11/26/23 210 lb (95.3 kg)  08/21/23 215 lb (97.5 kg)    General: Well developed, well nourished female in no apparent distress.  HEENT: AT/[place], no external lesions. Hearing intact to the spoken word Eyes: EOMI. No stare, or lid lag. Conjunctiva clear and no icterus.? Mild proptosis.  No watering or redness of the eyes. Neck: Trachea midline, neck supple  Lungs: Clear to auscultation, no wheeze. Respirations not labored Heart: S1S2, Regular in rate and rhythm. No loud murmurs Abdomen: Soft, non tender, non distended Neurologic: Alert, oriented, normal speech, deep tendon biceps reflexes normal,  no gross focal neurological deficit Extremities: No pedal pitting edema, no tremors of outstretched hands.  Acanthosis nigricans present. Skin: Warm, color good.  Psychiatric: Does not appear depressed or anxious  PERTINENT HISTORIC LABORATORY AND IMAGING STUDIES:  All pertinent laboratory results were reviewed. Please see HPI also for further details.   TSH  Date Value Ref Range Status  02/24/2024 3.34 mIU/L Final    Comment:              Reference Range .           > or = 20 Years  0.40-4.50 .                Pregnancy Ranges           First trimester    0.26-2.66           Second trimester   0.55-2.73           Third trimester    0.43-2.91   11/26/2023 1.99 0.35 - 5.50 uIU/mL Final  08/21/2023 4.12 mIU/L Final    Comment:              Reference Range .            > or = 20 Years  0.40-4.50 .                Pregnancy Ranges           First trimester  0.26-2.66           Second trimester   0.55-2.73           Third trimester    0.43-2.91      ASSESSMENT / PLAN  1. Hypothyroidism, postradioiodine therapy   2. H/O Graves' disease    Patient has postablative hypothyroidism has history of Graves' disease status post RAI therapy in November 2013.  She has developed hypothyroid at least from March 2015.   -She is currently taking levothyroxine  112 mcg daily for 6 days and half tablet on Sundays.  Plan: -Patient thyroid  function test normal, continue current dose of levothyroxine .  No change. -Check thyroid  function test today TSH, free T4 prior to follow-up visit. - Annual endocrinology follow-up.   Diagnoses and all orders for this visit:  Hypothyroidism, postradioiodine therapy -     T4, free -     TSH  H/O Graves' disease     DISPOSITION Follow up in clinic in 12 months suggested.    All questions answered and patient verbalized understanding of the plan.  [place] Teddie Mehta, MD [facility] Endocrinology [facility] [address] [place], [address] [place], [place] [address] Phone # [phone]  At least part of this note was generated using voice recognition software. Inadvertent word errors may have occurred, which were not recognized during the proofreading process.

## 2024-03-19 ENCOUNTER — Encounter: Payer: Self-pay | Admitting: Emergency Medicine

## 2024-03-20 NOTE — Telephone Encounter (Signed)
 Blood pressure too low.  Recommend to stop losartan  HCT for 1 or 2 days and continue checking blood pressure readings.  Let us  know about blood pressure readings.

## 2024-03-21 ENCOUNTER — Other Ambulatory Visit: Payer: Self-pay | Admitting: Emergency Medicine

## 2024-03-21 DIAGNOSIS — E1159 Type 2 diabetes mellitus with other circulatory complications: Secondary | ICD-10-CM

## 2024-03-21 DIAGNOSIS — E1169 Type 2 diabetes mellitus with other specified complication: Secondary | ICD-10-CM

## 2024-04-15 LAB — HM MAMMOGRAPHY

## 2024-04-17 ENCOUNTER — Encounter: Payer: Self-pay | Admitting: Emergency Medicine

## 2024-05-22 ENCOUNTER — Other Ambulatory Visit: Payer: Self-pay | Admitting: Emergency Medicine

## 2024-05-22 DIAGNOSIS — I10 Essential (primary) hypertension: Secondary | ICD-10-CM

## 2024-05-22 DIAGNOSIS — E1165 Type 2 diabetes mellitus with hyperglycemia: Secondary | ICD-10-CM

## 2024-05-27 ENCOUNTER — Ambulatory Visit: Admitting: Emergency Medicine

## 2024-05-27 ENCOUNTER — Encounter: Payer: Self-pay | Admitting: Emergency Medicine

## 2024-05-27 VITALS — BP 116/80 | HR 69 | Temp 97.6°F | Ht 64.0 in | Wt 197.0 lb

## 2024-05-27 DIAGNOSIS — E1165 Type 2 diabetes mellitus with hyperglycemia: Secondary | ICD-10-CM

## 2024-05-27 DIAGNOSIS — E1169 Type 2 diabetes mellitus with other specified complication: Secondary | ICD-10-CM

## 2024-05-27 DIAGNOSIS — E785 Hyperlipidemia, unspecified: Secondary | ICD-10-CM

## 2024-05-27 DIAGNOSIS — E1159 Type 2 diabetes mellitus with other circulatory complications: Secondary | ICD-10-CM

## 2024-05-27 DIAGNOSIS — E890 Postprocedural hypothyroidism: Secondary | ICD-10-CM | POA: Diagnosis not present

## 2024-05-27 DIAGNOSIS — I152 Hypertension secondary to endocrine disorders: Secondary | ICD-10-CM

## 2024-05-27 DIAGNOSIS — Z7985 Long-term (current) use of injectable non-insulin antidiabetic drugs: Secondary | ICD-10-CM

## 2024-05-27 LAB — POCT GLYCOSYLATED HEMOGLOBIN (HGB A1C): HbA1c POC (<> result, manual entry): 5.2 % (ref 4.0–5.6)

## 2024-05-27 NOTE — Patient Instructions (Signed)

## 2024-05-27 NOTE — Progress Notes (Signed)
 Danielle Khan Danielle Khan 53 y.o.   Chief Complaint  Patient presents with   Follow-up    Pt states that she is having indigestion and acid reflux     HISTORY OF PRESENT ILLNESS: This is a 53 y.o. female here for 60-month follow-up of chronic medical conditions including diabetes and hypertension Continues taking Mounjaro  with good results but having some GI side effects including indigestion and reflux Otherwise doing well.  No other complaints or medical concerns today.  HPI   Prior to Admission medications   Medication Sig Start Date End Date Taking? Authorizing Provider  atenolol  (TENORMIN ) 50 MG tablet Take 1 tablet by mouth once daily 05/22/24  Yes Sydnei Ohaver Jose, MD  Cyanocobalamin (VITAMIN B 12 PO) Take 5,000 mg by mouth every other day.   Yes [provider]  levothyroxine  (SYNTHROID ) 112 MCG tablet Take 1 tablet (112 mcg total) by mouth daily. 02/27/24  Yes Thapa, [place], MD  losartan -hydrochlorothiazide (HYZAAR) 50-12.5 MG tablet Take 1 tablet by mouth once daily 05/22/24  Yes Rhandi Despain Jose, MD  MAGNESIUM PO Take by mouth once a week.   Yes [provider]  metFORMIN  (GLUCOPHAGE ) 500 MG tablet TAKE 1 TABLET BY MOUTH TWICE DAILY WITH A MEAL 05/22/24  Yes Zykerria Tanton Jose, MD  MOUNJARO  7.5 MG/0.5ML Pen INJECT 7.5 MG SUBCUTANEOUSLY  ONCE A WEEK 03/21/24  Yes Samanta Gal, Emil Schanz, MD  psyllium (METAMUCIL) 58.6 % powder Take 1 packet by mouth as needed.   Yes [provider]  rosuvastatin  (CRESTOR ) 10 MG tablet Take 1 tablet (10 mg total) by mouth daily. 04/11/21  Yes Lonisha Bobby, Emil Schanz, MD  triamcinolone  (NASACORT  AQ) 55 MCG/ACT AERO nasal inhaler Place 2 sprays into the nose daily as needed. 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   Hypertension associated with diabetes (HCC) 08/19/2013    Past Medical History:  Diagnosis Date   Chronic constipation    uses metamucil   GERD (gastroesophageal reflux disease)    Hypertension    Thyroid  disease     Past Surgical History:  Procedure Laterality Date   ABDOMINAL HYSTERECTOMY      Social History   Socioeconomic History   Marital status: Married    Spouse name: Not on file   Number of children: Not on file   Years of education: Not on file   Highest education level: Not on file  Occupational History   Not on file  Tobacco Use   Smoking status: Never   Smokeless tobacco: Former    Types: Snuff   Tobacco comments:    last used 10/26/19  Substance and Sexual Activity   Alcohol use: No    Alcohol/week: 0.0 standard drinks of alcohol   Drug use: No   Sexual activity: Yes  Other Topics Concern   Not on file  Social History Narrative   Not on file   Social Drivers of Health   Financial Resource Strain: Not on file  Food Insecurity: Not on file  Transportation Needs: Not on file  Physical Activity: Not on file  Stress: Not on file  Social Connections: Not on file  Intimate  Partner Violence: Not on file    Family History  Problem Relation Age of Onset   Breast cancer Maternal Aunt    Ovarian cancer Maternal Aunt    Colon cancer Neg Hx    Colon polyps Neg Hx    Esophageal cancer Neg Hx    Rectal cancer Neg Hx    Stomach cancer Neg Hx      Review of Systems  Constitutional: Negative.  Negative for chills and fever.  HENT: Negative.  Negative for congestion and sore throat.   Respiratory: Negative.  Negative for cough and shortness of breath.   Cardiovascular: Negative.  Negative for chest pain and palpitations.  Gastrointestinal:  Positive for heartburn.  Genitourinary:  Negative.  Negative for dysuria and hematuria.  Skin: Negative.   Neurological: Negative.  Negative for dizziness and headaches.  All other systems reviewed and are negative.   Vitals:   05/27/24 1513  BP: 116/80  Pulse: 69  Temp: 97.6 F (36.4 C)  SpO2: 99%    Physical Exam Vitals reviewed.  Constitutional:      Appearance: Normal appearance.  HENT:     Head: Normocephalic.     Mouth/Throat:     Mouth: Mucous membranes are moist.     Pharynx: Oropharynx is clear.  Eyes:     Extraocular Movements: Extraocular movements intact.     Conjunctiva/sclera: Conjunctivae normal.     Pupils: Pupils are equal, round, and reactive to light.  Cardiovascular:     Rate and Rhythm: Normal rate and regular rhythm.     Pulses: Normal pulses.     Heart sounds: Normal heart sounds.  Pulmonary:     Effort: Pulmonary effort is normal.     Breath sounds: Normal breath sounds.  Abdominal:     Palpations: Abdomen is soft.     Tenderness: There is no abdominal tenderness.  Skin:    General: Skin is warm and dry.     Capillary Refill: Capillary refill takes less than 2 seconds.  Neurological:     General: No focal deficit present.     Mental Status: She is alert and oriented to person, place, and time.  Psychiatric:        Mood and Affect: Mood normal.        Behavior: Behavior normal.    Results for orders placed or performed in visit on 05/27/24 (from the past 24 hours)  POCT HgB A1C     Status: Normal   Collection Time: 05/27/24  3:47 PM  Result Value Ref Range   Hemoglobin A1C     HbA1c POC (<> result, manual entry) 5.2 4.0 - 5.6 %   HbA1c, POC (prediabetic range)     HbA1c, POC (controlled diabetic range)       ASSESSMENT & PLAN: A total of 40 minutes was spent with the patient and counseling/coordination of care regarding preparing for this visit, review of most recent office visit notes, review of multiple chronic medical conditions and their management, cardiovascular risks  associated with hypertension and diabetes, review of all medications, review of most recent bloodwork results including interpretation of today's hemoglobin A1c, review of health maintenance items, education on nutrition, prognosis, documentation, and need for follow up.   Problem List Items Addressed This Visit       Cardiovascular and Mediastinum   Hypertension associated with diabetes (HCC) - Primary   BP Readings from Last 3 Encounters:  05/27/24 116/80  02/27/24 90/60  11/26/23 102/68  Well-controlled hypertension  Continues Hyzaar 50-12.5 mg daily and atenolol  50 mg daily Well-controlled diabetes with hemoglobin A1c of 5.2 Continue weekly Mounjaro  7.5 mg and metformin  500 mg twice a day Cardiovascular risk associated with diabetes and hypertension discussed Diet and nutrition discussed Follow-up in 6 months        Endocrine   Hypothyroidism, postradioiodine therapy   Clinically euthyroid Continue levothyroxine  112 mcg daily Lab Results  Component Value Date   TSH 3.34 02/24/2024         Dyslipidemia associated with type 2 diabetes mellitus (HCC)   Chronic stable condition Diet and nutrition discussed Continue rosuvastatin  10 mg daily      Other Visit Diagnoses       Type 2 diabetes mellitus with hyperglycemia, without long-term current use of insulin (HCC)       Relevant Orders   POCT HgB A1C (Completed)        Patient Instructions  Health Maintenance, Female Adopting a healthy lifestyle and getting preventive care are important in promoting health and wellness. Ask your health care provider about: The right schedule for you to have regular tests and exams. Things you can do on your own to prevent diseases and keep yourself healthy. What should I know about diet, weight, and exercise? Eat a healthy diet  Eat a diet that includes plenty of vegetables, fruits, low-fat dairy products, and lean protein. Do not eat a lot of foods that are high in solid fats,  added sugars, or sodium. Maintain a healthy weight Body mass index (BMI) is used to identify weight problems. It estimates body fat based on height and weight. Your health care provider can help determine your BMI and help you achieve or maintain a healthy weight. Get regular exercise Get regular exercise. This is one of the most important things you can do for your health. Most adults should: Exercise for at least 150 minutes each week. The exercise should increase your heart rate and make you sweat (moderate-intensity exercise). Do strengthening exercises at least twice a week. This is in addition to the moderate-intensity exercise. Spend less time sitting. Even light physical activity can be beneficial. Watch cholesterol and blood lipids Have your blood tested for lipids and cholesterol at 54 years of age, then have this test every 5 years. Have your cholesterol levels checked more often if: Your lipid or cholesterol levels are high. You are older than 54 years of age. You are at high risk for heart disease. What should I know about cancer screening? Depending on your health history and family history, you may need to have cancer screening at various ages. This may include screening for: Breast cancer. Cervical cancer. Colorectal cancer. Skin cancer. Lung cancer. What should I know about heart disease, diabetes, and high blood pressure? Blood pressure and heart disease High blood pressure causes heart disease and increases the risk of stroke. This is more likely to develop in people who have high blood pressure readings or are overweight. Have your blood pressure checked: Every 3-5 years if you are 27-54 years of age. Every year if you are 57 years old or older. Diabetes Have regular diabetes screenings. This checks your fasting blood sugar level. Have the screening done: Once every three years after age 78 if you are at a normal weight and have a low risk for diabetes. More often  and at a younger age if you are overweight or have a high risk for diabetes. What should I know about preventing infection? Hepatitis B If  you have a higher risk for hepatitis B, you should be screened for this virus. Talk with your health care provider to find out if you are at risk for hepatitis B infection. Hepatitis C Testing is recommended for: Everyone born from [birth year] through 1966. Anyone with known risk factors for hepatitis C. Sexually transmitted infections (STIs) Get screened for STIs, including gonorrhea and chlamydia, if: You are sexually active and are younger than 54 years of age. You are older than 54 years of age and your health care provider tells you that you are at risk for this type of infection. Your sexual activity has changed since you were last screened, and you are at increased risk for chlamydia or gonorrhea. Ask your health care provider if you are at risk. Ask your health care provider about whether you are at high risk for HIV. Your health care provider may recommend a prescription medicine to help prevent HIV infection. If you choose to take medicine to prevent HIV, you should first get tested for HIV. You should then be tested every 3 months for as long as you are taking the medicine. Pregnancy If you are about to stop having your period (premenopausal) and you may become pregnant, seek counseling before you get pregnant. Take 400 to 800 micrograms (mcg) of folic acid every day if you become pregnant. Ask for birth control (contraception) if you want to prevent pregnancy. Osteoporosis and menopause Osteoporosis is a disease in which the bones lose minerals and strength with aging. This can result in bone fractures. If you are 74 years old or older, or if you are at risk for osteoporosis and fractures, ask your health care provider if you should: Be screened for bone loss. Take a calcium  or vitamin D supplement to lower your risk of fractures. Be given hormone  replacement therapy (HRT) to treat symptoms of menopause. Follow these instructions at home: Alcohol use Do not drink alcohol if: Your health care provider tells you not to drink. You are pregnant, may be pregnant, or are planning to become pregnant. If you drink alcohol: Limit how much you have to: 0-1 drink a day. Know how much alcohol is in your drink. In the [place] one drink equals one 12 oz bottle of beer (355 mL), one 5 oz glass of wine (148 mL), or one 1 oz glass of hard liquor (44 mL). Lifestyle Do not use any products that contain nicotine or tobacco. These products include cigarettes, chewing tobacco, and vaping devices, such as e-cigarettes. If you need help quitting, ask your health care provider. Do not use street drugs. Do not share needles. Ask your health care provider for help if you need support or information about quitting drugs. General instructions Schedule regular health, dental, and eye exams. Stay current with your vaccines. Tell your health care provider if: You often feel depressed. You have ever been abused or do not feel safe at home. Summary Adopting a healthy lifestyle and getting preventive care are important in promoting health and wellness. Follow your health care provider's instructions about healthy diet, exercising, and getting tested or screened for diseases. Follow your health care provider's instructions on monitoring your cholesterol and blood pressure. This information is not intended to replace advice given to you by your health care provider. Make sure you discuss any questions you have with your health care provider. Document Revised: 10/24/2020 Document Reviewed: 10/24/2020 Elsevier Patient Education  2024 [employer]     Emil Schaumann, MD [facility]  at [facility]

## 2024-05-27 NOTE — Assessment & Plan Note (Signed)
 Clinically euthyroid Continue levothyroxine  112 mcg daily Lab Results  Component Value Date   TSH 3.34 02/24/2024

## 2024-05-27 NOTE — Assessment & Plan Note (Addendum)
 BP Readings from Last 3 Encounters:  05/27/24 116/80  02/27/24 90/60  11/26/23 102/68  Well-controlled hypertension Continues Hyzaar 50-12.5 mg daily and atenolol  50 mg daily Well-controlled diabetes with hemoglobin A1c of 5.2 Continue weekly Mounjaro  7.5 mg and metformin  500 mg twice a day Cardiovascular risk associated with diabetes and hypertension discussed Diet and nutrition discussed Follow-up in 6 months

## 2024-05-27 NOTE — Assessment & Plan Note (Signed)
Chronic stable condition Diet and nutrition discussed Continue rosuvastatin 10 mg daily

## 2024-11-25 ENCOUNTER — Ambulatory Visit: Admitting: Emergency Medicine

## 2025-02-26 ENCOUNTER — Other Ambulatory Visit

## 2025-03-03 ENCOUNTER — Ambulatory Visit: Admitting: Endocrinology
